# Patient Record
Sex: Female | Born: 1935 | Race: White | Hispanic: No | Smoking: Never smoker
Health system: Southern US, Community
[De-identification: ages and names within clinical notes are randomized; demographics above are authoritative.]

## PROBLEM LIST (undated history)

## (undated) DIAGNOSIS — G629 Polyneuropathy, unspecified: Secondary | ICD-10-CM

## (undated) DIAGNOSIS — I340 Nonrheumatic mitral (valve) insufficiency: Secondary | ICD-10-CM

## (undated) DIAGNOSIS — K219 Gastro-esophageal reflux disease without esophagitis: Secondary | ICD-10-CM

## (undated) DIAGNOSIS — K222 Esophageal obstruction: Secondary | ICD-10-CM

## (undated) DIAGNOSIS — S72001A Fracture of unspecified part of neck of right femur, initial encounter for closed fracture: Secondary | ICD-10-CM

## (undated) DIAGNOSIS — R2681 Unsteadiness on feet: Secondary | ICD-10-CM

## (undated) DIAGNOSIS — M810 Age-related osteoporosis without current pathological fracture: Secondary | ICD-10-CM

## (undated) DIAGNOSIS — R32 Unspecified urinary incontinence: Secondary | ICD-10-CM

## (undated) DIAGNOSIS — G4730 Sleep apnea, unspecified: Secondary | ICD-10-CM

## (undated) DIAGNOSIS — M1990 Unspecified osteoarthritis, unspecified site: Secondary | ICD-10-CM

## (undated) DIAGNOSIS — N189 Chronic kidney disease, unspecified: Secondary | ICD-10-CM

## (undated) DIAGNOSIS — D649 Anemia, unspecified: Secondary | ICD-10-CM

## (undated) DIAGNOSIS — I447 Left bundle-branch block, unspecified: Secondary | ICD-10-CM

## (undated) DIAGNOSIS — I10 Essential (primary) hypertension: Secondary | ICD-10-CM

## (undated) DIAGNOSIS — K224 Dyskinesia of esophagus: Secondary | ICD-10-CM

## (undated) DIAGNOSIS — I509 Heart failure, unspecified: Secondary | ICD-10-CM

## (undated) DIAGNOSIS — I4891 Unspecified atrial fibrillation: Secondary | ICD-10-CM

## (undated) DIAGNOSIS — N281 Cyst of kidney, acquired: Secondary | ICD-10-CM

## (undated) DIAGNOSIS — M109 Gout, unspecified: Secondary | ICD-10-CM

## (undated) DIAGNOSIS — C189 Malignant neoplasm of colon, unspecified: Secondary | ICD-10-CM

## (undated) DIAGNOSIS — E785 Hyperlipidemia, unspecified: Secondary | ICD-10-CM

## (undated) DIAGNOSIS — I351 Nonrheumatic aortic (valve) insufficiency: Secondary | ICD-10-CM

## (undated) HISTORY — PX: HIP FRACTURE SURGERY: SHX118

## (undated) HISTORY — PX: OTHER SURGICAL HISTORY: SHX169

## (undated) HISTORY — PX: ESOPHAGOGASTRODUODENOSCOPY: SHX1529

## (undated) SURGERY — CARDIOVERSION
Anesthesia: Monitor Anesthesia Care

---

## 1997-10-22 ENCOUNTER — Other Ambulatory Visit: Admission: RE | Admit: 1997-10-22 | Discharge: 1997-10-22 | Payer: Self-pay | Admitting: *Deleted

## 1998-11-04 ENCOUNTER — Other Ambulatory Visit: Admission: RE | Admit: 1998-11-04 | Discharge: 1998-11-04 | Payer: Self-pay | Admitting: *Deleted

## 1998-11-04 ENCOUNTER — Encounter (INDEPENDENT_AMBULATORY_CARE_PROVIDER_SITE_OTHER): Payer: Self-pay | Admitting: *Deleted

## 2000-01-13 ENCOUNTER — Ambulatory Visit (HOSPITAL_COMMUNITY): Admission: RE | Admit: 2000-01-13 | Discharge: 2000-01-13 | Payer: Self-pay | Admitting: Gastroenterology

## 2000-07-23 ENCOUNTER — Ambulatory Visit (HOSPITAL_COMMUNITY): Admission: RE | Admit: 2000-07-23 | Discharge: 2000-07-23 | Payer: Self-pay | Admitting: Surgery

## 2000-07-23 ENCOUNTER — Encounter: Payer: Self-pay | Admitting: Surgery

## 2000-07-27 ENCOUNTER — Ambulatory Visit (HOSPITAL_COMMUNITY): Admission: RE | Admit: 2000-07-27 | Discharge: 2000-07-27 | Payer: Self-pay | Admitting: Surgery

## 2000-07-27 ENCOUNTER — Encounter: Payer: Self-pay | Admitting: Surgery

## 2000-08-04 ENCOUNTER — Encounter: Payer: Self-pay | Admitting: Urology

## 2000-08-04 ENCOUNTER — Encounter (INDEPENDENT_AMBULATORY_CARE_PROVIDER_SITE_OTHER): Payer: Self-pay | Admitting: *Deleted

## 2000-08-04 ENCOUNTER — Ambulatory Visit (HOSPITAL_COMMUNITY): Admission: RE | Admit: 2000-08-04 | Discharge: 2000-08-04 | Payer: Self-pay | Admitting: Urology

## 2003-03-23 ENCOUNTER — Ambulatory Visit (HOSPITAL_COMMUNITY): Admission: RE | Admit: 2003-03-23 | Discharge: 2003-03-23 | Payer: Self-pay | Admitting: Internal Medicine

## 2003-04-25 ENCOUNTER — Emergency Department (HOSPITAL_COMMUNITY): Admission: EM | Admit: 2003-04-25 | Discharge: 2003-04-25 | Payer: Self-pay | Admitting: Family Medicine

## 2004-01-30 ENCOUNTER — Encounter: Admission: RE | Admit: 2004-01-30 | Discharge: 2004-01-30 | Payer: Self-pay | Admitting: Internal Medicine

## 2004-10-29 ENCOUNTER — Ambulatory Visit: Payer: Self-pay | Admitting: Internal Medicine

## 2005-01-20 ENCOUNTER — Ambulatory Visit: Payer: Self-pay | Admitting: Internal Medicine

## 2005-01-27 ENCOUNTER — Ambulatory Visit: Payer: Self-pay | Admitting: Internal Medicine

## 2006-02-26 ENCOUNTER — Ambulatory Visit: Payer: Self-pay | Admitting: Internal Medicine

## 2006-03-18 ENCOUNTER — Ambulatory Visit: Payer: Self-pay | Admitting: Internal Medicine

## 2006-03-18 ENCOUNTER — Encounter (INDEPENDENT_AMBULATORY_CARE_PROVIDER_SITE_OTHER): Payer: Self-pay | Admitting: *Deleted

## 2006-04-13 ENCOUNTER — Ambulatory Visit: Payer: Self-pay | Admitting: Internal Medicine

## 2006-04-22 ENCOUNTER — Ambulatory Visit (HOSPITAL_COMMUNITY): Admission: RE | Admit: 2006-04-22 | Discharge: 2006-04-22 | Payer: Self-pay | Admitting: Internal Medicine

## 2006-06-02 ENCOUNTER — Inpatient Hospital Stay (HOSPITAL_COMMUNITY): Admission: RE | Admit: 2006-06-02 | Discharge: 2006-06-15 | Payer: Self-pay | Admitting: General Surgery

## 2006-06-02 ENCOUNTER — Encounter (INDEPENDENT_AMBULATORY_CARE_PROVIDER_SITE_OTHER): Payer: Self-pay | Admitting: Specialist

## 2006-06-08 ENCOUNTER — Encounter: Payer: Self-pay | Admitting: Cardiology

## 2006-06-11 ENCOUNTER — Ambulatory Visit: Payer: Self-pay | Admitting: Physical Medicine & Rehabilitation

## 2006-08-25 ENCOUNTER — Ambulatory Visit: Payer: Self-pay | Admitting: Hematology and Oncology

## 2006-09-03 LAB — COMPREHENSIVE METABOLIC PANEL
Albumin: 4 g/dL (ref 3.5–5.2)
Alkaline Phosphatase: 66 U/L (ref 39–117)
BUN: 29 mg/dL — ABNORMAL HIGH (ref 6–23)
Calcium: 9.1 mg/dL (ref 8.4–10.5)
Creatinine, Ser: 1.64 mg/dL — ABNORMAL HIGH (ref 0.40–1.20)
Glucose, Bld: 105 mg/dL — ABNORMAL HIGH (ref 70–99)
Potassium: 4 mEq/L (ref 3.5–5.3)

## 2006-09-03 LAB — CBC WITH DIFFERENTIAL/PLATELET
Basophils Absolute: 0 10*3/uL (ref 0.0–0.1)
EOS%: 3.6 % (ref 0.0–7.0)
Eosinophils Absolute: 0.2 10*3/uL (ref 0.0–0.5)
HCT: 28.2 % — ABNORMAL LOW (ref 34.8–46.6)
HGB: 9.8 g/dL — ABNORMAL LOW (ref 11.6–15.9)
MCH: 31.5 pg (ref 26.0–34.0)
MCV: 91.1 fL (ref 81.0–101.0)
MONO%: 8.2 % (ref 0.0–13.0)
NEUT#: 3.3 10*3/uL (ref 1.5–6.5)
NEUT%: 61.8 % (ref 39.6–76.8)
Platelets: 210 10*3/uL (ref 145–400)
RDW: 14.3 % (ref 11.3–14.5)

## 2006-09-09 ENCOUNTER — Ambulatory Visit (HOSPITAL_COMMUNITY): Admission: RE | Admit: 2006-09-09 | Discharge: 2006-09-09 | Payer: Self-pay | Admitting: Hematology and Oncology

## 2006-12-28 ENCOUNTER — Ambulatory Visit: Payer: Self-pay | Admitting: Hematology and Oncology

## 2006-12-30 LAB — CBC WITH DIFFERENTIAL/PLATELET
BASO%: 0.5 % (ref 0.0–2.0)
HCT: 30.4 % — ABNORMAL LOW (ref 34.8–46.6)
HGB: 10.6 g/dL — ABNORMAL LOW (ref 11.6–15.9)
MCHC: 34.7 g/dL (ref 32.0–36.0)
MONO#: 0.5 10*3/uL (ref 0.1–0.9)
NEUT%: 51.3 % (ref 39.6–76.8)
WBC: 5.4 10*3/uL (ref 3.9–10.0)
lymph#: 2 10*3/uL (ref 0.9–3.3)

## 2006-12-30 LAB — COMPREHENSIVE METABOLIC PANEL
ALT: 12 U/L (ref 0–35)
Albumin: 4.4 g/dL (ref 3.5–5.2)
CO2: 23 mEq/L (ref 19–32)
Calcium: 9.5 mg/dL (ref 8.4–10.5)
Chloride: 105 mEq/L (ref 96–112)
Creatinine, Ser: 1.66 mg/dL — ABNORMAL HIGH (ref 0.40–1.20)
Total Protein: 7.1 g/dL (ref 6.0–8.3)

## 2006-12-30 LAB — CEA: CEA: 2.3 ng/mL (ref 0.0–5.0)

## 2007-04-12 ENCOUNTER — Ambulatory Visit: Payer: Self-pay | Admitting: Hematology and Oncology

## 2007-04-14 ENCOUNTER — Ambulatory Visit (HOSPITAL_COMMUNITY): Admission: RE | Admit: 2007-04-14 | Discharge: 2007-04-14 | Payer: Self-pay | Admitting: Hematology and Oncology

## 2007-06-09 ENCOUNTER — Encounter: Admission: RE | Admit: 2007-06-09 | Discharge: 2007-09-07 | Payer: Self-pay | Admitting: Internal Medicine

## 2007-07-12 ENCOUNTER — Ambulatory Visit: Payer: Self-pay | Admitting: Hematology and Oncology

## 2007-07-14 LAB — CBC WITH DIFFERENTIAL/PLATELET
BASO%: 0.4 % (ref 0.0–2.0)
HCT: 32.7 % — ABNORMAL LOW (ref 34.8–46.6)
MCHC: 34 g/dL (ref 32.0–36.0)
MONO#: 0.5 10*3/uL (ref 0.1–0.9)
NEUT%: 62 % (ref 39.6–76.8)
RDW: 12.6 % (ref 11.3–14.5)
WBC: 5.9 10*3/uL (ref 3.9–10.0)
lymph#: 1.6 10*3/uL (ref 0.9–3.3)

## 2007-07-15 LAB — COMPREHENSIVE METABOLIC PANEL
ALT: 10 U/L (ref 0–35)
AST: 20 U/L (ref 0–37)
Alkaline Phosphatase: 50 U/L (ref 39–117)
BUN: 35 mg/dL — ABNORMAL HIGH (ref 6–23)
Calcium: 9.9 mg/dL (ref 8.4–10.5)
Chloride: 107 mEq/L (ref 96–112)
Creatinine, Ser: 1.77 mg/dL — ABNORMAL HIGH (ref 0.40–1.20)
Total Bilirubin: 0.3 mg/dL (ref 0.3–1.2)

## 2007-07-21 ENCOUNTER — Encounter: Payer: Self-pay | Admitting: Internal Medicine

## 2007-08-19 ENCOUNTER — Encounter: Payer: Self-pay | Admitting: Internal Medicine

## 2007-08-19 DIAGNOSIS — K649 Unspecified hemorrhoids: Secondary | ICD-10-CM | POA: Insufficient documentation

## 2007-08-19 DIAGNOSIS — Z87442 Personal history of urinary calculi: Secondary | ICD-10-CM | POA: Insufficient documentation

## 2007-08-19 DIAGNOSIS — G4730 Sleep apnea, unspecified: Secondary | ICD-10-CM | POA: Insufficient documentation

## 2007-08-19 DIAGNOSIS — D126 Benign neoplasm of colon, unspecified: Secondary | ICD-10-CM

## 2007-08-19 DIAGNOSIS — E785 Hyperlipidemia, unspecified: Secondary | ICD-10-CM | POA: Insufficient documentation

## 2007-08-19 DIAGNOSIS — K219 Gastro-esophageal reflux disease without esophagitis: Secondary | ICD-10-CM | POA: Insufficient documentation

## 2007-08-19 DIAGNOSIS — I10 Essential (primary) hypertension: Secondary | ICD-10-CM | POA: Insufficient documentation

## 2007-08-19 DIAGNOSIS — D509 Iron deficiency anemia, unspecified: Secondary | ICD-10-CM

## 2007-08-19 DIAGNOSIS — K319 Disease of stomach and duodenum, unspecified: Secondary | ICD-10-CM | POA: Insufficient documentation

## 2007-08-23 ENCOUNTER — Ambulatory Visit: Payer: Self-pay | Admitting: Internal Medicine

## 2007-08-23 DIAGNOSIS — Z85038 Personal history of other malignant neoplasm of large intestine: Secondary | ICD-10-CM | POA: Insufficient documentation

## 2007-09-21 ENCOUNTER — Ambulatory Visit: Payer: Self-pay | Admitting: Internal Medicine

## 2007-09-21 ENCOUNTER — Encounter: Payer: Self-pay | Admitting: Internal Medicine

## 2007-10-28 ENCOUNTER — Ambulatory Visit: Payer: Self-pay | Admitting: Hematology and Oncology

## 2007-11-01 LAB — CEA: CEA: 2.4 ng/mL (ref 0.0–5.0)

## 2007-11-01 LAB — COMPREHENSIVE METABOLIC PANEL
ALT: 12 U/L (ref 0–35)
BUN: 26 mg/dL — ABNORMAL HIGH (ref 6–23)
CO2: 24 mEq/L (ref 19–32)
Calcium: 10.2 mg/dL (ref 8.4–10.5)
Chloride: 104 mEq/L (ref 96–112)
Creatinine, Ser: 1.65 mg/dL — ABNORMAL HIGH (ref 0.40–1.20)

## 2007-11-01 LAB — CBC WITH DIFFERENTIAL/PLATELET
Eosinophils Absolute: 0.1 10*3/uL (ref 0.0–0.5)
HCT: 34.6 % — ABNORMAL LOW (ref 34.8–46.6)
HGB: 11.9 g/dL (ref 11.6–15.9)
LYMPH%: 37.6 % (ref 14.0–48.0)
MONO#: 0.4 10*3/uL (ref 0.1–0.9)
NEUT#: 2.6 10*3/uL (ref 1.5–6.5)
NEUT%: 50.6 % (ref 39.6–76.8)
Platelets: 164 10*3/uL (ref 145–400)
WBC: 5.2 10*3/uL (ref 3.9–10.0)

## 2007-11-03 ENCOUNTER — Ambulatory Visit (HOSPITAL_COMMUNITY): Admission: RE | Admit: 2007-11-03 | Discharge: 2007-11-03 | Payer: Self-pay | Admitting: Hematology and Oncology

## 2007-11-08 ENCOUNTER — Encounter: Payer: Self-pay | Admitting: Internal Medicine

## 2008-03-06 ENCOUNTER — Ambulatory Visit: Payer: Self-pay | Admitting: Hematology and Oncology

## 2008-03-15 ENCOUNTER — Encounter: Payer: Self-pay | Admitting: Internal Medicine

## 2008-07-03 ENCOUNTER — Ambulatory Visit: Payer: Self-pay | Admitting: Hematology and Oncology

## 2008-07-05 LAB — CBC WITH DIFFERENTIAL/PLATELET
BASO%: 0.7 % (ref 0.0–2.0)
HCT: 34.7 % — ABNORMAL LOW (ref 34.8–46.6)
LYMPH%: 36.1 % (ref 14.0–49.7)
MCHC: 33.1 g/dL (ref 31.5–36.0)
MCV: 91.3 fL (ref 79.5–101.0)
MONO%: 7.3 % (ref 0.0–14.0)
NEUT%: 53.7 % (ref 38.4–76.8)
Platelets: 160 10*3/uL (ref 145–400)
RBC: 3.8 10*6/uL (ref 3.70–5.45)

## 2008-07-05 LAB — COMPREHENSIVE METABOLIC PANEL
Alkaline Phosphatase: 46 U/L (ref 39–117)
BUN: 31 mg/dL — ABNORMAL HIGH (ref 6–23)
Glucose, Bld: 109 mg/dL — ABNORMAL HIGH (ref 70–99)
Sodium: 143 mEq/L (ref 135–145)
Total Bilirubin: 0.4 mg/dL (ref 0.3–1.2)

## 2008-07-12 ENCOUNTER — Encounter: Payer: Self-pay | Admitting: Internal Medicine

## 2008-12-19 ENCOUNTER — Ambulatory Visit (HOSPITAL_COMMUNITY): Admission: RE | Admit: 2008-12-19 | Discharge: 2008-12-19 | Payer: Self-pay | Admitting: Interventional Radiology

## 2009-01-02 ENCOUNTER — Encounter: Payer: Self-pay | Admitting: Interventional Radiology

## 2009-07-31 ENCOUNTER — Ambulatory Visit (HOSPITAL_COMMUNITY): Admission: RE | Admit: 2009-07-31 | Discharge: 2009-07-31 | Payer: Self-pay | Admitting: Internal Medicine

## 2010-01-28 ENCOUNTER — Inpatient Hospital Stay (HOSPITAL_COMMUNITY)
Admission: EM | Admit: 2010-01-28 | Discharge: 2010-01-31 | Payer: Self-pay | Source: Home / Self Care | Attending: Orthopedic Surgery | Admitting: Orthopedic Surgery

## 2010-01-29 LAB — CBC
HCT: 24.4 % — ABNORMAL LOW (ref 36.0–46.0)
HCT: 25.3 % — ABNORMAL LOW (ref 36.0–46.0)
Hemoglobin: 7.9 g/dL — ABNORMAL LOW (ref 12.0–15.0)
Hemoglobin: 8.2 g/dL — ABNORMAL LOW (ref 12.0–15.0)
MCH: 30.9 pg (ref 26.0–34.0)
MCH: 30.8 pg (ref 26.0–34.0)
MCHC: 32.4 g/dL (ref 30.0–36.0)
MCHC: 32.4 g/dL (ref 30.0–36.0)
MCV: 95.3 fL (ref 78.0–100.0)
MCV: 95.1 fL (ref 78.0–100.0)
Platelets: 100 10*3/uL — ABNORMAL LOW (ref 150–400)
Platelets: 107 10*3/uL — ABNORMAL LOW (ref 150–400)
RBC: 2.56 MIL/uL — ABNORMAL LOW (ref 3.87–5.11)
RBC: 2.66 MIL/uL — ABNORMAL LOW (ref 3.87–5.11)
RDW: 12.4 % (ref 11.5–15.5)
RDW: 12.4 % (ref 11.5–15.5)
WBC: 6.5 10*3/uL (ref 4.0–10.5)
WBC: 7 10*3/uL (ref 4.0–10.5)

## 2010-01-29 LAB — BASIC METABOLIC PANEL
BUN: 20 mg/dL (ref 6–23)
CO2: 28 mEq/L (ref 19–32)
Calcium: 8.7 mg/dL (ref 8.4–10.5)
Chloride: 107 mEq/L (ref 96–112)
Creatinine, Ser: 1.68 mg/dL — ABNORMAL HIGH (ref 0.4–1.2)
GFR calc Af Amer: 35 mL/min — ABNORMAL LOW (ref >60)
GFR calc non Af Amer: 29 mL/min — ABNORMAL LOW (ref >60)
Glucose, Bld: 106 mg/dL — ABNORMAL HIGH (ref 70–99)
Potassium: 4.5 mEq/L (ref 3.5–5.1)
Sodium: 141 mEq/L (ref 135–145)

## 2010-01-29 LAB — FERRITIN: Ferritin: 153 ng/mL (ref 10–291)

## 2010-01-29 LAB — IRON AND TIBC
Iron: 50 ug/dL (ref 42–135)
Saturation Ratios: 23 % (ref 20–55)
TIBC: 217 ug/dL — ABNORMAL LOW (ref 250–470)
UIBC: 167 ug/dL

## 2010-01-29 LAB — FOLATE: Folate: >20 ng/mL

## 2010-01-30 LAB — BASIC METABOLIC PANEL
BUN: 17 mg/dL (ref 6–23)
CO2: 27 mEq/L (ref 19–32)
Calcium: 8.7 mg/dL (ref 8.4–10.5)
Chloride: 109 mEq/L (ref 96–112)
Creatinine, Ser: 1.73 mg/dL — ABNORMAL HIGH (ref 0.4–1.2)
GFR calc Af Amer: 33 mL/min — ABNORMAL LOW (ref >60)
GFR calc non Af Amer: 28 mL/min — ABNORMAL LOW (ref >60)
Glucose, Bld: 126 mg/dL — ABNORMAL HIGH (ref 70–99)
Potassium: 4.5 mEq/L (ref 3.5–5.1)
Sodium: 141 mEq/L (ref 135–145)

## 2010-01-30 LAB — CBC
HCT: 22.7 % — ABNORMAL LOW (ref 36.0–46.0)
HCT: 32.5 % — ABNORMAL LOW (ref 36.0–46.0)
Hemoglobin: 7.2 g/dL — ABNORMAL LOW (ref 12.0–15.0)
Hemoglobin: 10.9 g/dL — ABNORMAL LOW (ref 12.0–15.0)
MCH: 30.3 pg (ref 26.0–34.0)
MCH: 30.8 pg (ref 26.0–34.0)
MCHC: 31.7 g/dL (ref 30.0–36.0)
MCHC: 33.5 g/dL (ref 30.0–36.0)
MCV: 95.4 fL (ref 78.0–100.0)
MCV: 91.8 fL (ref 78.0–100.0)
Platelets: 88 10*3/uL — ABNORMAL LOW (ref 150–400)
Platelets: 90 10*3/uL — ABNORMAL LOW (ref 150–400)
RBC: 2.38 MIL/uL — ABNORMAL LOW (ref 3.87–5.11)
RBC: 3.54 MIL/uL — ABNORMAL LOW (ref 3.87–5.11)
RDW: 12.5 % (ref 11.5–15.5)
RDW: 13.9 % (ref 11.5–15.5)
WBC: 6.7 10*3/uL (ref 4.0–10.5)
WBC: 7 10*3/uL (ref 4.0–10.5)

## 2010-01-30 LAB — PREPARE RBC (CROSSMATCH)

## 2010-01-30 LAB — VITAMIN B12: Vitamin B-12: 465 pg/mL (ref 211–911)

## 2010-01-31 LAB — CBC
HCT: 32.6 % — ABNORMAL LOW (ref 36.0–46.0)
Hemoglobin: 10.8 g/dL — ABNORMAL LOW (ref 12.0–15.0)
MCH: 30.3 pg (ref 26.0–34.0)
MCHC: 33.1 g/dL (ref 30.0–36.0)
MCV: 91.3 fL (ref 78.0–100.0)
Platelets: 98 10*3/uL — ABNORMAL LOW (ref 150–400)
RBC: 3.57 MIL/uL — ABNORMAL LOW (ref 3.87–5.11)
RDW: 14 % (ref 11.5–15.5)
WBC: 8.9 10*3/uL (ref 4.0–10.5)

## 2010-01-31 LAB — BASIC METABOLIC PANEL
BUN: 16 mg/dL (ref 6–23)
CO2: 26 mEq/L (ref 19–32)
Calcium: 9.1 mg/dL (ref 8.4–10.5)
Chloride: 107 mEq/L (ref 96–112)
Creatinine, Ser: 1.51 mg/dL — ABNORMAL HIGH (ref 0.4–1.2)
GFR calc Af Amer: 39 mL/min — ABNORMAL LOW (ref >60)
GFR calc non Af Amer: 32 mL/min — ABNORMAL LOW (ref >60)
Glucose, Bld: 113 mg/dL — ABNORMAL HIGH (ref 70–99)
Potassium: 4.5 mEq/L (ref 3.5–5.1)
Sodium: 142 mEq/L (ref 135–145)

## 2010-02-16 ENCOUNTER — Encounter: Payer: Self-pay | Admitting: Hematology and Oncology

## 2010-02-16 ENCOUNTER — Encounter: Payer: Self-pay | Admitting: Internal Medicine

## 2010-03-05 NOTE — Consult Note (Signed)
Melissa Robinson, Melissa Robinson                 ACCOUNT NO.:  [id number]  MEDICAL RECORD NO.:  [id number]          PATIENT TYPE:  INP  LOCATION:  1619                         FACILITY:  [facility]  PHYSICIAN:  Melissa Robinson, M.D.  DATE OF BIRTH:  02/15/1916  DATE OF CONSULTATION:  01/29/2010 DATE OF DISCHARGE:                                CONSULTATION   REQUESTING PHYSICIAN:  Melissa Robinson. Melissa Robinson, M.D.  REASON FOR CONSULTATION:  Medical management following open reduction and internal fixation of right hip fracture.  HISTORY OF PRESENT ILLNESS:  Ms. Melissa Robinson is a remarkable 77 year old white female with osteoporosis with prior thoracic compression fracture status post kyphoplasty (December 2010), chronic renal insufficiency, and anemia of chronic disease who presented to the emergency department with right hip pain following a fall.  The patient is a remarkably independent lady who lives alone.  She has a very supportive family and maintains most of her own affairs.  She does have some slight instability of gait at home but has been very resistant to using walking aids.  On the morning of admission, she slipped when standing from a chair and landed on her right hip resulting in a right hip fracture. She presented to the emergency department and was taken to the operating room last evening by Dr. Charlann Robinson.  We have been requested to manage her medical issues as Dr. Charlann Robinson has graciously accepted the patient on his service.  She tolerated the procedure very well last evening.  This morning, she is without complaint.  She states that the pain is tolerable.  She denies any chest pain or shortness of breath.  REVIEW OF SYMPTOMS:  Chronic back and hip pain prior to surgery, unsteady gait.  No other complaints.  PAST MEDICAL HISTORY: 1. Hypertension. 2. Osteoporosis with prior T11-T12 and T10 compression fractures with     kyphoplasty of T10 (December 2010). 3. Chronic renal insufficiency. 4. Congestive  heart failure (ejection fraction in May 2008 of 30% to     40% in the setting of acute illness).  No recurrent heart failure     symptoms since that hospitalization 5. Colon cancer stage 3, status post hemicolectomy (May 2008). 6. Hypertriglyceridemia. 7. Obstructive sleep apnea. 8. Gout. 9. Dysphagia status post EGD with dilatation. 10.Anemia of chronic disease. 11.Left renal cyst aspiration. 12.Status post rotator cuff surgery.  PREMEDICATIONS: 1. Ferrex 150 mg daily. 2. Lasix 20 mg daily. 3. Miacalcin nasal spray altering nostrils daily. 4. Norvasc 10 mg daily. 5. Prilosec 20 mg daily. 6. Simvastatin 20 mg daily. 7. Tramadol 50 mg q.8 h p.r.n. pain. 8. Evista 60 mg daily. 9. Calcium/vitamin D b.i.d. 10.Aspirin 81 mg daily. 11.Multivitamins daily. 12.Oxybutynin 5 mg b.i.d. 13.Losartan 50 mg daily. 14.Trazodone 150 mg q.h.s. 15.Vitamin D daily.  ALLERGIES:  No known drug allergies.  SOCIAL HISTORY:  She is a widow.  Her husband was a Development worker, community at [facility].  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  She has 10 children and has a a very supportive family, many of whom are in [place].  No tobacco, alcohol, or drug use.  FAMILY HISTORY:  Father died of depression/suicide.  Mother died at 40 from heart disease.  Brother had throat cancer and sister with breast cancer.  PHYSICAL EXAMINATION:  VITAL SIGNS:  Temperature 98.5, pulse 53 to 58, blood pressure 117 to 145 over 51 to 69, oxygen saturation 97% on 2 L. GENERAL:  In general, very energetic jovial female, appears younger than her stated age. HEENT:  Oropharynx is moist.  No scleral icterus. NECK:  Supple without lymphadenopathy, JVD or carotid bruits. HEART:  Regular rate and rhythm without murmurs, rubs or gallops. LUNGS:  Clear to auscultation bilaterally. ABDOMEN:  Soft, nondistended, nontender with normoactive bowel sounds. EXTREMITIES:  Her right hip as a dressing intact without any blood or drainage.  No  significant edema.  Mildly tender.  NEUROLOGIC:  Alert and oriented x4.  No focal deficits.  LABORATORY DATA:  CBC shows a white count of 6.5, hemoglobin 7.9 (9.7 preoperatively), platelets 100.  BMET significant for sodium 141, potassium 4.5, chloride 107, bicarb 28, BUN 20, creatinine 1.7, glucose 106.  ASSESSMENT/PLAN: 1. Right hip fracture status post open reduction and internal fixation     (postop day #1) - we will continue postoperative care per     Orthopedics.  She is doing remarkably well at this point.  She is     on Lovenox and SCDs for DVT prophylaxis. 2. Hypertension - blood pressure reasonably controlled.  We will hold     her Cozaar for now and monitor her renal function.  When stable, we     will resume her Cozaar.  We will restart her Norvasc at this point     at a decrease dose 5 mg daily and titrate back to 10 mg as needed. 3. Anemia - she does have anemia of chronic disease related to her     renal insufficiency superimposed on acute blood loss anemia related     to her fracture.  We will recheck her CBC and consider transfusion     if her hemoglobin drops any further.  She is currently asymptomatic     with stable blood pressure.  We will obtain an anemia profile to     evaluate her iron stores.  We will continue iron and add Aranesp     for anemia related to chronic renal insufficiency. 4. Chronic renal insufficiency.  She is stable.  We will hold her ARB     and diuretics for short period of time and then resume as long as     she remains stable. 5. Osteoporosis - she has been on Evista.  Other options including     bisphosphonates have been limited by her chronic renal     insufficiency.  We will need to consider Forteo when she is stable     and discharge from rehab. 6. Disposition - anticipate she will need skilled nursing facility     rehab after discharge for short period of time.  Hopefully, she     will be able to return home to her independent living  setting.  I greatly appreciate the prompt orthopedics management of this special lady who is my first patient in [place].  I will continue to follow her course with you as an inpatient and help with transition home after her rehab stay.     Melissa Robinson, M.D.     WS/MEDQ  D:  01/30/2010  T:  01/30/2010  Job:  [id number]  cc:   Melissa Robinson Melissa Robinson, M.D. Fax: [phone]  Electronically  Signed by Lacretia Nicks. Buren Kos M.D. on 03/05/2010 12:20:08 PM

## 2010-04-07 LAB — PROTIME-INR
INR: 1.11 (ref 0.00–1.49)
Prothrombin Time: 14.5 seconds (ref 11.6–15.2)

## 2010-04-07 LAB — DIFFERENTIAL
Basophils Absolute: 0 10*3/uL (ref 0.0–0.1)
Basophils Relative: 1 % (ref 0–1)
Eosinophils Absolute: 0.1 10*3/uL (ref 0.0–0.7)
Eosinophils Relative: 1 % (ref 0–5)
Lymphocytes Relative: 14 % (ref 12–46)
Lymphs Abs: 0.9 10*3/uL (ref 0.7–4.0)
Monocytes Absolute: 0.6 10*3/uL (ref 0.1–1.0)
Monocytes Relative: 9 % (ref 3–12)
Neutro Abs: 5.1 10*3/uL (ref 1.7–7.7)
Neutrophils Relative %: 76 % (ref 43–77)

## 2010-04-07 LAB — TYPE AND SCREEN
ABO/RH(D): O NEG
Antibody Screen: NEGATIVE
Unit division: 0
Unit division: 0

## 2010-04-07 LAB — CBC
HCT: 29.5 % — ABNORMAL LOW (ref 36.0–46.0)
Hemoglobin: 9.7 g/dL — ABNORMAL LOW (ref 12.0–15.0)
MCH: 31.3 pg (ref 26.0–34.0)
MCHC: 32.9 g/dL (ref 30.0–36.0)
MCV: 95.2 fL (ref 78.0–100.0)
Platelets: 129 10*3/uL — ABNORMAL LOW (ref 150–400)
RBC: 3.1 MIL/uL — ABNORMAL LOW (ref 3.87–5.11)
RDW: 12.3 % (ref 11.5–15.5)
WBC: 6.8 10*3/uL (ref 4.0–10.5)

## 2010-04-07 LAB — BASIC METABOLIC PANEL
BUN: 22 mg/dL (ref 6–23)
CO2: 27 mEq/L (ref 19–32)
Calcium: 9.2 mg/dL (ref 8.4–10.5)
Chloride: 105 mEq/L (ref 96–112)
Creatinine, Ser: 1.79 mg/dL — ABNORMAL HIGH (ref 0.4–1.2)
GFR calc Af Amer: 32 mL/min — ABNORMAL LOW (ref >60)
GFR calc non Af Amer: 27 mL/min — ABNORMAL LOW (ref >60)
Glucose, Bld: 108 mg/dL — ABNORMAL HIGH (ref 70–99)
Potassium: 4.3 mEq/L (ref 3.5–5.1)
Sodium: 141 mEq/L (ref 135–145)

## 2010-04-07 LAB — URINE CULTURE
Colony Count: NO GROWTH
Culture  Setup Time: 201201040125
Culture: NO GROWTH

## 2010-04-07 LAB — URINALYSIS, ROUTINE W REFLEX MICROSCOPIC
Bilirubin Urine: NEGATIVE
Glucose, UA: NEGATIVE mg/dL
Hgb urine dipstick: NEGATIVE
Ketones, ur: NEGATIVE mg/dL
Nitrite: NEGATIVE
Protein, ur: NEGATIVE mg/dL
Specific Gravity, Urine: 1.013 (ref 1.005–1.030)
Urobilinogen, UA: 0.2 mg/dL (ref 0.0–1.0)
pH: 6.5 (ref 5.0–8.0)

## 2010-04-07 LAB — APTT: aPTT: 31 seconds (ref 24–37)

## 2010-04-30 LAB — CBC
HCT: 32.5 % — ABNORMAL LOW (ref 36.0–46.0)
Hemoglobin: 11.2 g/dL — ABNORMAL LOW (ref 12.0–15.0)
RBC: 3.41 MIL/uL — ABNORMAL LOW (ref 3.87–5.11)
RDW: 12.7 % (ref 11.5–15.5)
WBC: 5.9 10*3/uL (ref 4.0–10.5)

## 2010-04-30 LAB — APTT: aPTT: 33 seconds (ref 24–37)

## 2010-04-30 LAB — BASIC METABOLIC PANEL
GFR calc non Af Amer: 28 mL/min — ABNORMAL LOW (ref >60)
Glucose, Bld: 116 mg/dL — ABNORMAL HIGH (ref 70–99)
Potassium: 4.2 mEq/L (ref 3.5–5.1)
Sodium: 140 mEq/L (ref 135–145)

## 2010-06-10 NOTE — Discharge Summary (Signed)
NAMEDANNIELL, ROTUNDO                 ACCOUNT NO.:  [id number]   MEDICAL RECORD NO.:  [id number]          PATIENT TYPE:  INP   LOCATION:  1416                         FACILITY:  [facility]   PHYSICIAN:  Adolph Pollack, M.D.DATE OF BIRTH:  06-05-1916   DATE OF ADMISSION:  06/02/2006  DATE OF DISCHARGE:  06/15/2006                               DISCHARGE SUMMARY   FINAL DIAGNOSIS:  Stage III right colon cancer.   SECONDARY DIAGNOSES:  1. Postoperative ileus  2. Hypertension.  3. Acute parotiditis.  4. Congestive heart failure.  5. Delirium.  6. Deconditioned state  7. Chronic renal insufficiency with acute exacerbation.  8. Hypokalemia.  9. Anemia.  10.Escherichia coli urinary tract infection.   PROCEDURE:  Laparoscopic-assisted right colectomy.   REASON FOR ADMISSION:  Ms. Russell is an 77 year old female who was  noted to have anemia, weight loss and decreasing appetite.  A  colonoscopy demonstrated a very large villous lesion of the cecum and  ascending colon.  The biopsy was consistent with an adenomatous-type  polyp.  However, it was felt that her blood loss might be secondary to  this and because it could not be removed completely by way of endoscopy,  I was subsequently asked to see her.  We discussed the right colectomy  and she was admitted for that.  Please that she was secondary diagnosis  year E-coli urinary tract infection.   HOSPITAL COURSE:  She underwent a laparoscopic-assisted right colectomy.  Postoperatively, she was relatively stable overnight.  Her pathology  came back a T3 N1 adenocarcinoma, making it a stage III colon cancer.  She had some mild ileus and diet was then advanced to clear liquids.  She had some mild hypoxemia and we checked a chest x-ray.  She also  complained of a tender left preauricular swelling.  She had bibasilar  rales and chest x-ray consistent with pulmonary edema.  I subsequently  called Dr. Clelia Croft in for a medical consult and he began  treatment of the  CHF with diuresis.  Unasyn was started for her parotiditis.  Her BNP had  been elevated.  Her troponin levels were also elevated, potentially  suggesting strain from her CHF.  Dr. Clelia Croft was able to treat her  medically and stabilize her from her congestive heart failure.  She had  some delirium and Haldol was used p.r.n.; this slowly improved.  Subsequently, her bowel function began to return and she was able to  have her diet advanced.  By her eighth postoperative day her staples  were removed and she was placed on oral Augmentin.  It was felt that she  would benefit from a comprehensive skilled nursing facility care.  Subsequently, arrangements were made for her to go a skilled nursing  facility ([facility]).  By her 13th postoperative day she was  stronger, tolerating a diet.  At that point in time she did not wish to  go to [facility] but wanted to be discharged home.  I spoke with the  family and they said they could provide her with 24-hour care.  It was  also  noted that she still had some chronic renal insufficiency with a  BUN of 30, creatinine about 2.5.  This was improved.   DISPOSITION:  She was discharged to home in the care of her family Jun 15, 2006, which is postoperative day #13.   DISCHARGE MEDICATIONS:  1. Atenolol 100 mg daily.  2. Protonix 40 mg daily.  3. Aspirin 81 mg daily.  4. Cozaar 100 mg daily.  5. Multivitamin.  6. Norvasc 10 mg daily.  7. Darvocet for pain.  8. Evista.  9. Calcium with D.  10.Fortical.   She is to see Dr. Clelia Croft in a couple of days to have blood work checked.  She will come back and see me in 2 weeks.      Adolph Pollack, M.D.  Electronically Signed     TJR/MEDQ  D:  08/11/2006  T:  08/12/2006  Job:  [id number]   cc:   [name], M.D.  Fax: [phone]

## 2010-06-13 NOTE — Procedures (Signed)
[facility]  Patient:    Melissa Robinson, Melissa Robinson                        MRN: [id number] Proc. Date: 01/13/00 Adm. Date:  [id number] Attending:  Orland Mustard CC:         Georg Ruddle. Viviann Spare, M.D.  Erskine Speed, M.D.   Procedure Report  DATE OF BIRTH:  August 22, 1916  PROCEDURE:  Colonoscopy.  MEDICATIONS:  Fentanyl 50 mcg, Versed 6 mg IV.  SCOPE:  Pediatric Olympus video colonoscope.  INDICATIONS:  Colon cancer screening.  DESCRIPTION OF PROCEDURE:  The procedure had been explained to the patient and consent obtained.  With the patient in the left lateral decubitus, the Olympus pediatric video colonoscope was inserted and advanced under direct visualization.  The prep was marginal.  There was sticky, adherent stool, particularly in the right colon.  A small polyp could have been missed.  The ileocecal valve was seen and the appendiceal orifice was clearly identified. The scope was withdrawn.  The cecum, ascending colon, hepatic flexure, transverse colon, splenic flexure were seen well.  A small AVM 2-3 mm was seen in the descending colon and was not actively bleeding.  There was no significant diverticular disease and no polyps or tumors seen throughout the colon.  The rectum was also seen and was normal other than the presence of fairly large internal hemorrhoids. The patient tolerated the procedure well and was maintained on low flow oxygen and pulse oximetry throughout the procedure.  ASSESSMENT: 1. Internal hemorrhoids. 2. 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  PLAN: 1. Will give hemorrhoid instructions. 2. Recommend yearly Hemoccults and sigmoidoscopy in five years. DD:  01/13/00 TD:  01/14/00 Job: [id number]

## 2010-06-13 NOTE — Assessment & Plan Note (Signed)
[facility]                         GASTROENTEROLOGY OFFICE NOTE   NAME:Griffitts, Franshesca R                        MRN:          [id number]  DATE:02/26/2006                            DOB:          09/02/1916    REFERRING PHYSICIAN:  [name], M.D.   REASON FOR CONSULTATION:  Iron deficiency anemia.   HISTORY:  This is a pleasant, 77 year old female with a history of  hypertension, kidney stones, sleep apnea, and gastroesophageal reflux  disease complicated by peptic stricture, for which she has undergone  prior esophageal dilation.  Patient is now referred through the courtesy  of Dr. Clelia Croft regarding iron deficiency anemia.  Patient has noticed in  recent months problems with fatigue.  She has also had decreased  appetite and weight loss.  She also reports recurrent solid food  dysphagia, and required the Heimlich maneuver after getting choked on a  piece of chicken recently.  As part of her workup for fatigue, she  underwent a CBC, January 22, 2006.  She was anemic with a hemoglobin of  10.3.  Additional laboratories confirmed iron deficiency with a ferritin  level of 13.7, and an iron saturation of 18.3%.  B12 and folate were  normal.  Her MCV was normal.  White blood cell count and platelets were  normal.  She was placed on iron.  She denies other GI symptoms such as  abdominal pain, change in bowel habits, melena or hematochezia.  She  apparently submitted hemoccult cards, though the results are unknown.  She did undergo colonoscopy with Dr. Carman Ching in December of 2001  for the purposes of colon cancer screening.  She was noted to have  internal hemorrhoids, as well as small arteriovenous malformations of  the descending colon.  No other abnormalities.  Her last upper endoscopy  was performed in January of 2007 for recurrent dysphagia.  At that time,  the esophagus was dilated to a maximal diameter of 54 [place] via a  Maloney dilator.   It was recommended that she continue on omeprazole.   PAST MEDICAL HISTORY:  As above.   PAST SURGICAL HISTORY:  None.   FAMILY HISTORY:  Brother with throat cancer.   SOCIAL HISTORY:  Patient is widowed with 10 children.  She lives alone.  She is a retired Engineer, civil (consulting).   ALLERGIES:  NO KNOWN DRUG ALLERGIES.   CURRENT MEDICATIONS:  1. Atenolol 100 mg daily.  2. Hydrochlorothiazide 25 mg daily.  3. Aspirin 81 mg daily.  4. Calcium with vitamin D.  5. Multivitamin.  6. Nifedipine 60 mg daily.  7. Raloxifene 60 mg daily.  8. B12.  9. Trazodone 50 mg at night.  10.Fortical nasal spray.   PHYSICAL EXAMINATION:  Pleasant, elderly female in no acute distress.  Blood pressure is 126/70.  Heart rate is 60 and regular.  Her weight is  134.8 pounds.  HEENT:  Sclerae are anicteric, conjunctivae are slightly pale.  The oral  mucosa is intact.  There are no telangiectasias on the buccal mucosa.  There is no adenopathy.  LUNGS:  Clear.  HEART:  Regular.  ABDOMEN:  Soft without tenderness, mass, or hernia.  Good bowel sounds  are heard.   IMPRESSION:  1. Symptomatic iron deficiency anemia.  Rule out chronic      gastrointestinal blood loss from previously noted colonic vascular      malformations, ulcer, or occult neoplasm (particularly given weight      loss).  2. History of gastroesophageal reflux disease with peptic stricture.      Patient is currently experiencing recurrent dysphagia likely due to      recurrent stricture formation.   RECOMMENDATIONS:  1. Daily proton pump inhibitor therapy.  2. Iron replacement therapy.  3. Schedule colonoscopy and upper endoscopy with esophageal dilation.      The nature of both procedures, as well as the risks, benefits, and      alternatives were discussed in detail.  She understood and agreed      to proceed.     [name]. [name], MD  Electronically Signed    JNP/MedQ  DD: 02/28/2006  DT: 02/28/2006  Job #: [id number]   cc:   [name],  M.D.

## 2010-06-13 NOTE — Consult Note (Signed)
[name], Robinson                 ACCOUNT NO.:  [id number]   MEDICAL RECORD NO.:  [id number]          PATIENT TYPE:  INP   LOCATION:  1231                         FACILITY:  [facility]   PHYSICIAN:  [name], M.D.  DATE OF BIRTH:  04-19-1916   DATE OF CONSULTATION:  DATE OF DISCHARGE:                                 CONSULTATION   MEDICINE CONSULTATION:   REQUESTING PHYSICIAN:  Adolph Pollack, M.D.   REASON FOR CONSULTATION:  Shortness of breath, hypoxemia  postoperatively.   HISTORY OF PRESENT ILLNESS:  Melissa Robinson is a very pleasant 77 year old  white female with a history of hypertension, chronic renal  insufficiency, and persistent iron-deficiency anemia, was admitted on  May 7 for laparoscopic-assisted colectomy due to a large right-sided  colon polyp.  The patient has had progressive iron-deficiency anemia and  underwent outpatient colonoscopic evaluation, which revealed a  concerning broad-based polyp.  Despite the negative biopsy results,  concern was present for a possible underlying cancer.  After a very  thoughtful approach and informed decision-making, she underwent her  surgery on May 7, which did reveal stage III colon cancer.  She did very  well initially postoperatively until at about 36-48 hours ago, when she  developed increasing shortness of breath, confusion, and hypoxia.  She  had a chest x-ray performed yesterday that did show increased edema from  her preoperative x-ray.  She has received two doses of Lasix with  stabilization but no definite improvement.  She denies any chest pain,  cough, purulence, or hemoptysis.  In addition, she has developed left-  sided facial pain and swelling.  She was placed on Unasyn for a left  parotitis.   REVIEW OF SYSTEMS:  All systems were reviewed with the patient and are  negative except in the HPI.  She is starting to pass some gas and has  minimal abdominal tenderness.  She has back pain.  All other systems  are  negative.   PAST MEDICAL HISTORY:  1. Stage III colon cancer, status post right laparoscopic-assisted      colectomy on Jun 02, 2006, as above.  2. Hypertension.  3. Chronic renal insufficiency with baseline creatinine of 1.8-1.9.  4. Osteoporosis with a history of vertebral compression fracture.  5. Iron-deficiency anemia.  6. Dysphasia secondary to stricture, status post he the patient      (February 2008).   CURRENT MEDICATIONS:  1. Unasyn 3 g q.6. hours, day #1.  2. Atenolol 100 mg daily.  3. Lovenox 30 mg daily.  4. Protonix 40 mg daily  5. Lasix p.r.n.   ALLERGIES:  No known drug allergies.   SOCIAL HISTORY:  She is a widow.  Her husband was actually an Administrator, arts  in [place].  She has 10 children and multiple grandchildren.  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  No  tobacco, alcohol or drug use.   FAMILY HISTORY:  Mother died of heart disease later in life.  Brother  and sister had throat cancer and breast cancer, respectively.  PHYSICAL EXAMINATION:  VITAL SIGNS:  Temperature 97.8, pulse 79-82,  respirations 24, blood pressure 178-183 over 90-103, with oxygen  saturation 93% on 100% mask.  Decreases to 85% on nasal cannula.  GENERAL:  She is to get neck and dysarthric.  Some facial and arm  swelling.  HEENT:  Oropharynx is moist.  NECK:  Supple without lymphadenopathy, JVD or carotid bruits.  She does  have a left parotid swelling and tenderness with no erythema.  HEART:  Regular rate and rhythm with frequent ectopy.  LUNGS:  Bibasilar crackles about a third of the way up.  No rhonchi.  ABDOMEN:  Her incision is clear, dry and intact with minimal tenderness.  No rebound or guarding.  NEUROLOGIC:  Alert and oriented x4.  Slightly dysarthric.  No focal  abnormalities.   LABORATORY DATA:  CBC shows a white count of 16.2, hemoglobin 12.2,  platelets 235.  BMET significant for sodium 137, potassium 3.5, chloride  105, bicarb  23, BUN 17, creatinine 1.4, glucose 149.  BNP 3070.  Troponin 1.4.  CK normal at 48.   STUDIES:  1. Chest x-ray personally reviewed shows increasing bilateral right      upper lobe and right lower lobe greater than left lower lobe air      space disease, which is likely pulmonary edema.  She does have      cardiomegaly, which was present preoperatively.  2. EKG from May 1 (preoperatively) shows a left bundle branch block      which is old compared to prior EKG.   ASSESSMENT/PLAN:  1. Hypoxia, likely secondary to congestive heart failure in the      setting of hypertension and IV fluid hydration.  This is likely due      to diastolic dysfunction and her slight increase in cardiac enzymes      are likely due to supply/ in the setting of her surgery.  I doubt      that she has had a postoperative myocardial infarction of      significance.  Will obtain an echocardiogram and monitor serial      cardiac enzymes.  Will diurese aggressively with Lasix.  Continue      her atenolol.  Restart her Cozaar with close monitoring of her      renal function.  We will use clonidine as needed for elevations of      blood pressure.  Will monitor her I&O's and daily weights closely.      I doubt that this is a pulmonary embolism or pneumonia given her      chest x-ray findings and markedly increased BNP.  2. Chronic renal insufficiency.  Will monitor her renal function      closely with ARB and diuresis.  Expect increase in creatinine based      on hemoconcentration.  3. Left parotitis.  I agree with Unasyn.  We will try lemon drops as      tolerated.  Consider imaging, although this does appear to be an      acute infection in the setting of her surgery.  4. Delirium.  Likely sundowning due to her acute illness.  Will obtain      a TSH and urinalysis.  Avoid oversedation.  5. Ethics.  I have discussed this with the patient and her daughter.     She is DNR and has a living will.  She does not desire       extraordinary means, including CPR, intubation,  or artificial      nutrition even for a short period of time.  She has been very      consistent in these wishes.   I appreciate the opportunity to participate in Ms. Arizola's care and I  appreciate the expert surgical management.  I will follow her course  closely with you.      [name], M.D.  Electronically Signed     WS/MEDQ  D:  06/07/2006  T:  06/07/2006  Job:  [id number]

## 2010-06-13 NOTE — Assessment & Plan Note (Signed)
[facility]                         GASTROENTEROLOGY OFFICE NOTE   NAME:Racanelli, Dante R                        MRN:          [id number]  DATE:04/13/2006                            DOB:          29-Nov-1916    HISTORY:  Marshell presents today for follow-up.  She complains of problems  with poor appetite, difficulty swallowing, and questions regarding  possible surgery for a large right colon villous adenoma.  The patient  was evaluated in the office February 26, 2006, for iron-deficiency  anemia.  Also weight loss and complaints of dysphagia.  Colonoscopy  revealed a large, sprawling villous lesion involving the cecum and  ascending colon.  This was not amenable to endoscopic removal.  Upper  endoscopy revealed mild stricture of the distal esophagus as well as a  small hiatal hernia.  No other abnormalities.  The esophagus was dilated  with a 50 [place] Maloney dilator.  The patient was referred to Dr.  Abbey Chatters for consideration of laparoscopic-assisted right  hemicolectomy.  That consultation occurred March 30, 2006.  After  complete discussion regarding the procedure, the patient wished to think  about things a bit.  Dr. Clelia Croft has scheduled the patient for a barium  swallow as her swallowing difficulties continue despite dilation as  discussed.  She points to the cervical esophagus.  Question  cricopharyngeal bar.   MEDICATIONS:  As per her last visit.   PHYSICAL EXAMINATION:  GENERAL:  A well-appearing female in no acute  distress.  VITAL SIGNS:  Blood pressure 130/70, heart rate 72, weight is 135.6  pounds (increased 1.2 pounds).  HEENT:  Sclerae are anicteric.  ABDOMEN:  Soft without tenderness, mass or hernia.   IMPRESSION:  1. Iron-deficiency anemia secondary to large villous lesion in the      right colon.  Though biopsy is negative, the lesion may harbor      cancer.  I discussed with her today the pros and cons of proceeding      with surgery.   She is favoring surgery.  2. Nonspecific complaints of decreased appetite.  3. Dysphagia.  Barium swallow pending.  Endoscopy with dilation as      discussed above.  No further GI  therapy planned.   RECOMMENDATIONS:  The patient and her daughter are going to contact Dr.  Abbey Chatters to set up surgery.  As well, we will await results of her  swallowing study.     [name]. [name], MD  Electronically Signed    JNP/MedQ  DD: 04/14/2006  DT: 04/15/2006  Job #: [id number]   cc:   [name], M.D.  Adolph Pollack, M.D.

## 2010-06-13 NOTE — Op Note (Signed)
NAMEVINCIE, Melissa Robinson                 ACCOUNT NO.:  [id number]   MEDICAL RECORD NO.:  [id number]          PATIENT TYPE:  INP   LOCATION:  1529                         FACILITY:  [facility]   PHYSICIAN:  Melissa Robinson, M.D.DATE OF BIRTH:  1917-01-19   DATE OF PROCEDURE:  06/02/2006  DATE OF DISCHARGE:                               OPERATIVE REPORT   PREOPERATIVE DIAGNOSIS:  Large adenomatous mass of the cecum.   POSTOPERATIVE DIAGNOSIS:  Large adenomatous mass of the cecum.   PROCEDURE:  Laparoscopic assisted right colectomy with resection of  terminal ileum.   SURGEON:  Melissa Robinson, M.D.   ASSISTANT:  Melissa Robinson, M.D.   ANESTHESIA:  General.   INDICATIONS:  This 77 year old female was discovered to have anemia and  on a workup, she had a very large polypoid mass of the cecum that was  biopsied and demonstrated adenomatous changes.  Because it could not be  removed completely, she now presents for elective partial colectomy.   TECHNIQUE:  She is seen in the holding area and brought to the operating  room, placed supine on the operating table and general anesthetic was  administered.  A Foley catheter was placed in the bladder.  The  abdominal wall was sterilely prepped and draped.  A small subumbilical  incision was made through the skin, subcutaneous tissue, fascia and  peritoneum, entering the peritoneal cavity under direct vision.  A  pursestring suture of 0 Vicryl was placed around the fascial edges.  A  Hassan trocar was introduced to the peritoneal cavity and  pneumoperitoneum created by insufflation of CO2 gas.   Next, a laparoscope was introduced.  A 5 mm trocar was placed in the  right lower quadrant and a 10 mm trocar placed in the left upper  quadrant.  The right colon and cecum were then retracted medially  exposing the thin peritoneal attachments which were divided with the  harmonic scalpel mobilizing the right colon and allowing me to retract  it  medially.  I then retracted hepatic flexure inferiorly and divided  its thin attachments and mobilized the proximal half of the transverse  colon allowing it to be pulled under the umbilicus.  The distal ileum  was mobile.  I did not see any liver lesions.  No obvious metastasis of  lesions in the pelvis.   Following this, I removed the [facility] trocar and made a lower midline  incision for an extraction site.  The wound protection device was  placed.  I then removed the cecum, terminal ileum, right colon and  proximal transverse colon through this small incision.  The mass was  palpated and was somewhat exophytic, very suspicious for malignancy.  I  divided the terminal ileum with the GIA stapler and divided the proximal  third of the transverse colon with a GIA stapler.  I then dissected  omentum free from the colon and divided the mesentery using the  LigaSure.  The right colic and ileocolic vessels were also ligated with  Vicryl ties.  This provided for a large wedge shaped piece of mesentery.  The specimen was handed off the field and sent to pathology.   I then performed a side-to-side stapled anastomosis using the GIA  stapler.  The common defect was closed with a running 3-0 Vicryl suture  followed by interrupted 3-0 silk sutures in Lembert fashion, thus a two  layer closure.  The anastomosis was patent, viable, under no tension,  and placed back into the abdominal cavity.   Following this, I irrigated out the abdominal cavity and evacuated the  fluid.  No bleeding was noted.  Gloves were changed.  I requested a  needle, sponge and instrument count and it was reported to be correct.  I then closed the fascia of the extraction site incision in the lower  midline with a running #1 PDS suture.  The abdomen was reinsufflated and  the area of the anastomosis inspected and the fascial closure was solid.  The remaining irrigation fluid was evacuated.  I then released the   pneumoperitoneum and removed the trocars.  All skin incisions were  closed with staples followed by sterile dressings.  She tolerated the  procedure well without apparent complications and was taken to recovery  in satisfactory condition.      Melissa Robinson, M.D.  Electronically Signed     TJR/MEDQ  D:  06/02/2006  T:  06/02/2006  Job:  [id number]   cc:   Melissa Robinson, M.D.  Fax: [phone]   Melissa Robinson. Melissa Goodell, MD  [address]  [place]  [place] [address]

## 2010-06-13 NOTE — H&P (Signed)
Melissa Robinson, Melissa Robinson                 ACCOUNT NO.:  [id number]   MEDICAL RECORD NO.:  [id number]          PATIENT TYPE:  INP   LOCATION:  1529                         [facility]ACILITY:  [facility]   PHYSICIAN:  Adolph Pollack, M.D.DATE OF BIRTH:  1916/11/24   DATE OF ADMISSION:  06/02/2006  DATE OF DISCHARGE:                              HISTORY & PHYSICAL   REASON FOR ADMISSION:  Elective partial colectomy.   HISTORY OF PRESENT ILLNESS:  Melissa Robinson is an 77 year old female who  had had anemia as well as weight loss and decreased appetite.  Evaluation for anemia included a colonoscopy which demonstrated a very  large villous lesion involving the cecum and ascending colon.  Approximately 40-50% of the circumference was involved.  A biopsy was  performed and this was an adenoma type polyp with no malignant features.  However, because this was felt to be her are of blood loss and the polyp  could not be completely removed, she is admitted for elective  laparoscopic-assisted partial colectomy.   PAST MEDICAL HISTORY:  1. Hypertension.  2. Osteoporosis.  3. Arthritis.  4. Gastroesophageal reflux disease.  5. Hypercholesterolemia.  6. Anemia.  7. Insomnia.   PREVIOUS OPERATIONS:  Tonsillectomy.   ALLERGIES:  NONE KNOWN.   CURRENT MEDICATIONS:  Atenolol, hydrochlorothiazide, aspirin, calcium,  multivitamin, nifedipine, Celebrex, Protonix, vitamin D, and Evista,  Fortical, Tandem Plus.   SOCIAL HISTORY:  Single, lives alone, is very independent.  No tobacco  abuse.   FAMILY HISTORY:  Notable for heart disease, breast cancer, head and neck  cancer.   PHYSICAL EXAMINATION:  GENERAL APPEARANCE:  An elderly female in no  acute distress.  She is very pleasant and cooperative.  VITAL SIGNS:  Temperature is 98.4, blood pressure is 178/79, pulse of  68.  EYES:  Extraocular movements intact.  No icterus.  NECK:  Supple without obvious masses.  RESPIRATORY:  Breath sounds equal and clear.   Respirations unlabored.  CARDIOVASCULAR:  Regular rate and regular rhythm.  No lower extremity.  ABDOMEN:  Soft, nontender, nondistended, no masses or organomegaly.  No  hernias.  MUSCULOSKELETAL:  SCD hose are on.   IMPRESSION:  Large bleeding cecal mass, could not be removed  endoscopically.   PLAN:  Laparoscopic-assisted partial colectomy.  I have discussed the  procedure and risks with her and her daughter preop.      Adolph Pollack, M.D.  Electronically Signed     TJR/MEDQ  D:  06/02/2006  T:  06/02/2006  Job:  [id number]

## 2010-09-04 ENCOUNTER — Other Ambulatory Visit: Payer: Self-pay | Admitting: Internal Medicine

## 2010-09-04 DIAGNOSIS — N63 Unspecified lump in unspecified breast: Secondary | ICD-10-CM

## 2010-09-10 ENCOUNTER — Other Ambulatory Visit: Payer: Self-pay

## 2010-10-20 LAB — CREATININE, SERUM: Creatinine, Ser: 2.12 — ABNORMAL HIGH

## 2010-12-05 ENCOUNTER — Other Ambulatory Visit: Payer: Self-pay | Admitting: Dermatology

## 2012-05-21 ENCOUNTER — Observation Stay (HOSPITAL_COMMUNITY)
Admission: EM | Admit: 2012-05-21 | Discharge: 2012-05-22 | Disposition: A | Discharge status: Home Health | Payer: Medicare Other | Attending: Internal Medicine | Admitting: Internal Medicine

## 2012-05-21 ENCOUNTER — Encounter (HOSPITAL_COMMUNITY)
Admission: EM | Disposition: A | Discharge status: Home Health | Payer: Self-pay | Source: Home / Self Care | Attending: Internal Medicine

## 2012-05-21 ENCOUNTER — Emergency Department (HOSPITAL_COMMUNITY): Payer: Medicare Other

## 2012-05-21 ENCOUNTER — Encounter (HOSPITAL_COMMUNITY): Payer: Self-pay | Admitting: Physical Medicine and Rehabilitation

## 2012-05-21 DIAGNOSIS — N184 Chronic kidney disease, stage 4 (severe): Secondary | ICD-10-CM | POA: Diagnosis present

## 2012-05-21 DIAGNOSIS — K219 Gastro-esophageal reflux disease without esophagitis: Secondary | ICD-10-CM

## 2012-05-21 DIAGNOSIS — N183 Chronic kidney disease, stage 3 unspecified: Secondary | ICD-10-CM | POA: Insufficient documentation

## 2012-05-21 DIAGNOSIS — D509 Iron deficiency anemia, unspecified: Secondary | ICD-10-CM | POA: Insufficient documentation

## 2012-05-21 DIAGNOSIS — Z85038 Personal history of other malignant neoplasm of large intestine: Secondary | ICD-10-CM | POA: Insufficient documentation

## 2012-05-21 DIAGNOSIS — I480 Paroxysmal atrial fibrillation: Secondary | ICD-10-CM | POA: Diagnosis present

## 2012-05-21 DIAGNOSIS — G5790 Unspecified mononeuropathy of unspecified lower limb: Secondary | ICD-10-CM | POA: Insufficient documentation

## 2012-05-21 DIAGNOSIS — K224 Dyskinesia of esophagus: Secondary | ICD-10-CM

## 2012-05-21 DIAGNOSIS — T18108A Unspecified foreign body in esophagus causing other injury, initial encounter: Principal | ICD-10-CM | POA: Insufficient documentation

## 2012-05-21 DIAGNOSIS — I509 Heart failure, unspecified: Secondary | ICD-10-CM | POA: Insufficient documentation

## 2012-05-21 DIAGNOSIS — T18128A Food in esophagus causing other injury, initial encounter: Secondary | ICD-10-CM

## 2012-05-21 DIAGNOSIS — I5022 Chronic systolic (congestive) heart failure: Secondary | ICD-10-CM | POA: Diagnosis present

## 2012-05-21 DIAGNOSIS — I129 Hypertensive chronic kidney disease with stage 1 through stage 4 chronic kidney disease, or unspecified chronic kidney disease: Secondary | ICD-10-CM | POA: Insufficient documentation

## 2012-05-21 DIAGNOSIS — Z66 Do not resuscitate: Secondary | ICD-10-CM | POA: Insufficient documentation

## 2012-05-21 DIAGNOSIS — K222 Esophageal obstruction: Secondary | ICD-10-CM | POA: Insufficient documentation

## 2012-05-21 DIAGNOSIS — R1310 Dysphagia, unspecified: Secondary | ICD-10-CM | POA: Insufficient documentation

## 2012-05-21 DIAGNOSIS — I10 Essential (primary) hypertension: Secondary | ICD-10-CM | POA: Diagnosis present

## 2012-05-21 DIAGNOSIS — I498 Other specified cardiac arrhythmias: Secondary | ICD-10-CM | POA: Insufficient documentation

## 2012-05-21 DIAGNOSIS — IMO0002 Reserved for concepts with insufficient information to code with codable children: Secondary | ICD-10-CM | POA: Insufficient documentation

## 2012-05-21 DIAGNOSIS — E785 Hyperlipidemia, unspecified: Secondary | ICD-10-CM | POA: Insufficient documentation

## 2012-05-21 DIAGNOSIS — I4891 Unspecified atrial fibrillation: Secondary | ICD-10-CM | POA: Insufficient documentation

## 2012-05-21 HISTORY — DX: Dyskinesia of esophagus: K22.4

## 2012-05-21 HISTORY — DX: Hyperlipidemia, unspecified: E78.5

## 2012-05-21 HISTORY — DX: Essential (primary) hypertension: I10

## 2012-05-21 HISTORY — DX: Polyneuropathy, unspecified: G62.9

## 2012-05-21 HISTORY — PX: ESOPHAGOGASTRODUODENOSCOPY: SHX5428

## 2012-05-21 LAB — COMPREHENSIVE METABOLIC PANEL
ALT: 10 U/L (ref 0–35)
Alkaline Phosphatase: 38 U/L — ABNORMAL LOW (ref 39–117)
BUN: 27 mg/dL — ABNORMAL HIGH (ref 6–23)
CO2: 24 mEq/L (ref 19–32)
Calcium: 9.4 mg/dL (ref 8.4–10.5)
GFR calc Af Amer: 31 mL/min — ABNORMAL LOW (ref >90)
GFR calc non Af Amer: 27 mL/min — ABNORMAL LOW (ref >90)
Glucose, Bld: 110 mg/dL — ABNORMAL HIGH (ref 70–99)
Sodium: 143 mEq/L (ref 135–145)

## 2012-05-21 LAB — CBC WITH DIFFERENTIAL/PLATELET
Eosinophils Relative: 2 % (ref 0–5)
HCT: 34.2 % — ABNORMAL LOW (ref 36.0–46.0)
Hemoglobin: 11.7 g/dL — ABNORMAL LOW (ref 12.0–15.0)
Lymphocytes Relative: 26 % (ref 12–46)
Lymphs Abs: 1.4 10*3/uL (ref 0.7–4.0)
MCV: 89.5 fL (ref 78.0–100.0)
Monocytes Relative: 7 % (ref 3–12)
Platelets: 144 10*3/uL — ABNORMAL LOW (ref 150–400)
RBC: 3.82 MIL/uL — ABNORMAL LOW (ref 3.87–5.11)
WBC: 5.4 10*3/uL (ref 4.0–10.5)

## 2012-05-21 SURGERY — EGD (ESOPHAGOGASTRODUODENOSCOPY)
Anesthesia: Moderate Sedation

## 2012-05-21 MED ORDER — MIDAZOLAM HCL 10 MG/2ML IJ SOLN
INTRAMUSCULAR | Status: DC | PRN
  Administered 2012-05-21: 2 mg via INTRAVENOUS

## 2012-05-21 MED ORDER — MORPHINE SULFATE 2 MG/ML IJ SOLN: 1.0000 mg | INTRAMUSCULAR | Status: DC | PRN

## 2012-05-21 MED ORDER — PANTOPRAZOLE SODIUM 40 MG IV SOLR
40.0000 mg | INTRAVENOUS | Status: DC
  Administered 2012-05-21: 40 mg via INTRAVENOUS
  Filled 2012-05-21 (×2): qty 40

## 2012-05-21 MED ORDER — FENTANYL CITRATE 0.05 MG/ML IJ SOLN
INTRAMUSCULAR | Status: DC | PRN
  Administered 2012-05-21: 25 ug via INTRAVENOUS

## 2012-05-21 MED ORDER — DILTIAZEM HCL 100 MG IV SOLR
5.0000 mg/h | INTRAVENOUS | Status: DC
  Administered 2012-05-21: 5 mg/h via INTRAVENOUS

## 2012-05-21 MED ORDER — ONDANSETRON HCL 4 MG PO TABS: 4.0000 mg | ORAL_TABLET | Freq: Four times a day (QID) | ORAL | Status: DC | PRN

## 2012-05-21 MED ORDER — SODIUM CHLORIDE 0.9 % IJ SOLN
3.0000 mL | Freq: Two times a day (BID) | INTRAMUSCULAR | Status: DC
  Administered 2012-05-22: 3 mL via INTRAVENOUS

## 2012-05-21 MED ORDER — SODIUM CHLORIDE 0.9 % IV SOLN: INTRAVENOUS | Status: DC

## 2012-05-21 MED ORDER — ENOXAPARIN SODIUM 30 MG/0.3ML ~~LOC~~ SOLN
30.0000 mg | SUBCUTANEOUS | Status: DC
  Administered 2012-05-21: 30 mg via SUBCUTANEOUS
  Filled 2012-05-21 (×2): qty 0.3

## 2012-05-21 MED ORDER — SODIUM CHLORIDE 0.9 % IV SOLN: Freq: Once | INTRAVENOUS | Status: DC

## 2012-05-21 MED ORDER — FENTANYL CITRATE 0.05 MG/ML IJ SOLN
INTRAMUSCULAR | Status: AC
  Filled 2012-05-21: qty 4

## 2012-05-21 MED ORDER — BUTAMBEN-TETRACAINE-BENZOCAINE 2-2-14 % EX AERO
INHALATION_SPRAY | CUTANEOUS | Status: DC | PRN
  Administered 2012-05-21: 2 via TOPICAL

## 2012-05-21 MED ORDER — SODIUM CHLORIDE 0.9 % IV BOLUS (SEPSIS)
500.0000 mL | Freq: Once | INTRAVENOUS | Status: AC
  Administered 2012-05-21: 500 mL via INTRAVENOUS

## 2012-05-21 MED ORDER — MIDAZOLAM HCL 5 MG/ML IJ SOLN
INTRAMUSCULAR | Status: AC
  Filled 2012-05-21: qty 3

## 2012-05-21 MED ORDER — DIPHENHYDRAMINE HCL 50 MG/ML IJ SOLN
INTRAMUSCULAR | Status: AC
  Filled 2012-05-21: qty 1

## 2012-05-21 MED ORDER — ONDANSETRON HCL 4 MG/2ML IJ SOLN: 4.0000 mg | Freq: Four times a day (QID) | INTRAMUSCULAR | Status: DC | PRN

## 2012-05-21 NOTE — ED Notes (Signed)
Pt GFR 27 - CT change to wo dye.

## 2012-05-21 NOTE — H&P (Signed)
PCP:   [name], MD   Chief Complaint:  Unable to swallow  HPI: Melissa Robinson is a pleasant 79 year old white female who is well-known to me she was my first patient at [facility]. She has a remarkably independent white female with a history of hypertension, congestive heart failure (ejection fraction 30-45% in 2008) which has been well compensated, and chronic dysphagia s/p prior EGD with dilation.  The patient has had chronic dysphasia issues which have been more oropharyngeal. She has been seen by speech therapy and had a modified barium swallow study in 2001 with recommendations for chin tuck with swallowing. She was fairly stable until about 2 weeks ago when she started having more solid food dysphagia. She states that the food feels it's getting stuck in the back of her throat or upper esophagus. She saw ENT recently for evaluation of this per her family's request. They recommended swallow study. I actually saw her several days ago and suggested that we do a modified barium swallow for further evaluation. She declined that at that time.  Over the past 48 hours her swallowing issues have acutely worsened to the point that she is now unable to swallow any liquids or solids. Her daughter brought her to the emergency room this morning where she had a CT of the neck showing food material within a distended mid esophagus indicative of food impaction. She denies any odynophagia, weight loss, but melena, or nausea or vomiting. She is spitting up lots of oral secretions. GI consult is pending.  Incidentally she was found in the emergency department to have an irregular heartbeat. EKG shows supraventricular tachycardia (A. fib versus atrial flutter versus sinus tach with frequent PACs).  She has no prior history of atrial fibrillation but does have a long-standing history of PACs. She is relatively asymptomatic with this although she has had some increasing dizziness recently. In our office  last week she was found to be orthostatic. At that time her Lasix was discontinued. Given her heart rhythm issues and esophageal impaction we were called for admission.  Review of Systems:  Review of Systems - all systems were reviewed with the patient and are negative except in history of present illness with the following exceptions: Chronic neuropathy in her feet, chronic back pain, recent irritability.  Past Medical History: HTN Osteoporosis--> T10/11/12 compression fracture s/p kyphoplasty (12/10), Right hip fracture s/p ORIF (1/12)-->Prolia 12/12- L renal cyst Chronic renal insufficiency, stage 3 Gait instability Dysphagia Hypertriglyceridemia CHF Colon cancer, stage III s/p hemicolectomy Sleep apnea Arthritis Cholelithiasis Gout GERD Urinary urge incontinence Anemia  Past Surgical History Sm bowel resection Compression Fx (T11/T12), T10-->kyphoplasty (12/10) Rotator cuff L renal cyst aspiration EGD w/ dilation  Medications: Prior to Admission medications   Medication Sig Start Date End Date Taking? Authorizing Provider  amLODipine (NORVASC) 10 MG tablet Take 10 mg by mouth daily.   Yes Historical Provider, MD  aspirin EC 81 MG tablet Take 81 mg by mouth daily.   Yes Historical Provider, MD  atenolol (TENORMIN) 100 MG tablet Take 100 mg by mouth daily.   Yes Historical Provider, MD  CALCIUM-VITAMIN D PO Take 1 tablet by mouth 2 (two) times daily.   Yes Historical Provider, MD  cholecalciferol (VITAMIN D) 400 UNITS TABS Take 400 Units by mouth daily.   Yes Historical Provider, MD  cyanocobalamin 500 MCG tablet Take 500 mcg by mouth daily.   Yes Historical Provider, MD  denosumab (PROLIA) 60 MG/ML SOLN injection Inject 60 mg into  the skin every 6 (six) months. Administer in upper arm, thigh, or abdomen   Yes Historical Provider, MD  iron polysaccharides (NIFEREX) 150 MG capsule Take 150 mg by mouth daily.   Yes Historical Provider, MD  losartan (COZAAR) 100 MG tablet Take  50 mg by mouth daily.   Yes Historical Provider, MD  mirabegron ER (MYRBETRIQ) 25 MG TB24 Take 25 mg by mouth daily.   Yes Historical Provider, MD  Multiple Vitamin (MULTIVITAMIN WITH MINERALS) TABS Take 1 tablet by mouth daily.   Yes Historical Provider, MD  omeprazole (PRILOSEC) 20 MG capsule Take 20 mg by mouth 2 (two) times daily.   Yes Historical Provider, MD  traMADol (ULTRAM) 50 MG tablet Take 50 mg by mouth every 8 (eight) hours as needed for pain.   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 Education:  nursing school, [employer]. 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  Family History: Father:  depression, suicide. Mother deceased at 62, heart dz. Siblings:  M, throat cancer.  S, breast cancer.  Physical Exam: Filed Vitals:   05/21/12 0938 05/21/12 1506  BP: 159/88 162/62  Pulse: 46 80  Temp: 98.6 F (37 C)   TempSrc: Oral   Resp: 18 16  Height:  5\' 5"  (1.651 m)  Weight:  56.7 kg (125 lb)  SpO2: 97% 94%   General appearance: alert and no distress Head: Normocephalic, without obvious abnormality, atraumatic Eyes: conjunctivae/corneas clear. PERRL, EOM's intact.  Nose: Nares normal. Septum midline. Mucosa normal. No drainage or sinus tenderness. Throat: lips, mucosa, and tongue normal; teeth and gums normal Neck: no adenopathy, no carotid bruit, no JVD and thyroid not enlarged, symmetric, no tenderness/mass/nodules Resp: clear to auscultation bilaterally Cardio: irregularly irregular rhythm GI: soft, non-tender; bowel sounds normal; no masses,  no organomegaly Extremities: extremities normal, atraumatic, no cyanosis or edema Pulses: 2+ and symmetric Lymph nodes: Cervical adenopathy: no cervical lymphadenopathy Neurologic: Alert and oriented X 3, normal strength and tone. Normal symmetric reflexes.    Labs on Admission:   Recent Labs   05/21/12 0950  NA 143  K 3.9  CL 107  CO2 24  GLUCOSE 110*  BUN 27*  CREATININE 1.60*  CALCIUM 9.4    Recent Labs  05/21/12 0950  AST 26  ALT 10  ALKPHOS 38*  BILITOT 0.7  PROT 7.3  ALBUMIN 4.1   No results found for this basename: LIPASE, AMYLASE,  in the last 72 hours  Recent Labs  05/21/12 0950  WBC 5.4  NEUTROABS 3.5  HGB 11.7*  HCT 34.2*  MCV 89.5  PLT 144*   No results found for this basename: CKTOTAL, CKMB, CKMBINDEX, TROPONINI,  in the last 72 hours Lab Results  Component Value Date   INR 1.11 01/28/2010   INR 1.09 12/19/2008    Radiological Exams on Admission: Ct Soft Tissue Neck Wo Contrast  05/21/2012  *RADIOLOGY REPORT*  Clinical Data: 79 year old female with solid food dysphagia. Nausea and vomiting.  CT NECK WITHOUT CONTRAST  Technique:  Multidetector CT imaging of the neck was performed without intravenous contrast.  Comparison: 09/09/2006 PET-CT  Findings: The pharynx and larynx are unremarkable. There is no evidence of mass.  Food material within a distended mid esophagus is noted - question distal obstructive process or nonspecific esophageal motility disorder. The thyroid gland, vascular structures and salivary glands are within normal limits.  Enlargement  of the pulmonary arteries is suggestive of pulmonary tree hypertension. Generalized cerebral volume loss and mild chronic small vessel white matter ischemic changes are identified within the visualized brain. Degenerative changes within the cervical spine are identified.  A mucous retention cyst/polyp within the right maxillary sinus is present.  IMPRESSION: Food material within a distended mid esophagus.  The distal esophagus is not visualized - question obstructive process versus esophageal motility disorder. Consider further evaluation with esophagram.  Pulmonary arterial hypertension.  No other significant abnormalities identified.   Original Report Authenticated By: Harmon Pier, M.D.    Orders  placed during the hospital encounter of 05/21/12  . EKG 12-LEAD- personally reviewed and discussed with Dr. wall (cardiology)-she appears to have a left bundle branch block with sinus tachycardia with intermittent A. fib with RVR. Telemetry shows intermittent supraventricular tachycardia with rates in the 140s     Assessment/Plan Principal Problem: 1. Food impaction of esophagus- she appears to have acute on chronic dysphagia with inability to swallow and regurgitation of oral secretions consistent with a esophageal impaction as seen on CT.  GI consult is pending for anticipated EGD with food extraction and dilation if possible.  Active Problems: 2. Dysphagia- she has chronic dysphagia which may be oropharyngeal superimposed on her esophageal dysphagia. We'll consult speech therapy for evaluation after her EGD. 3. Paroxysmal atrial fibrillation- this appears to be new onset atrial fibrillation which is likely related to the adrenergic response from her esophageal impaction.  Will start Cardizem drip for rate control. She is a poor anticoagulation candidate due to her recurrent falls in the past. We'll resume aspirin when she is stable from a GI standpoint. 4. ANEMIA, IRON DEFICIENCY- she has chronic anemia which is stable and multifactorial secondary to chronic renal insufficiency and mild iron deficiency. Continue iron supplement when able. 5. Chronic renal insufficiency, stage III (moderate)- renal function is stable. Her Lasix was discontinued recently do to orthostasis. 6. Systolic CHF, chronic- shown ejection fraction of 30-45% in the setting of acute illness. She has been very well compensated since that time so an echocardiogram has not been repeated. It is likely that her cardiac function has improved. She's been maintained on medical therapy with a beta blocker and ARB which she has tolerated well. We'll resume when tolerating oral medications. 7. Hypertension- her blood pressure has been a  bit labile with some orthostasis recently. Her Lasix was discontinued. We'll resume home medications when able. We'll use Cardizem drip for rate control and blood pressure control for now until tolerating oral medications. 8. Disposition-anticipate discharge back home in 2-3 days if she is stable after EGD. 9. Ethics- discussed code status with patient and she is very clear that she is DNR- does not desire CPR, intubation, DCCV, or pressors.     Kariah Loredo,W DOUGLAS 05/21/2012, 3:14 PM

## 2012-05-21 NOTE — H&P (Signed)
  HISTORY OF PRESENT ILLNESS:  Melissa Robinson is a 79 y.o. female with past medical history as outlined. I have known her for problems with colon cancer. She also has a history of peptic stricture requiring dilation. Chronic progressive dysphagia. CT earlier today suggesting esophageal obstruction versus gross motility disorder with retained food. Plan emergent/urgent endoscopy to sort out and hopefully help.  REVIEW OF SYSTEMS:  All non-GI ROS negative except for fatigue Past Medical History  Diagnosis Date  . Hypertension   . Hyperlipemia   . Neuropathy     Past Surgical History  Procedure Laterality Date  . Esophagogastroduodenoscopy    . Colonosopy    . Hip fracture surgery      Social History Melissa Robinson  reports that she has never smoked. She does not have any smokeless tobacco history on file. She reports that she does not drink alcohol or use illicit drugs.  family history is not on file.  No Known Allergies     PHYSICAL EXAMINATION: Vital signs: BP 115/52  Pulse 81  Temp(Src) 98.2 F (36.8 C) (Oral)  Resp 21  Ht 5\' 5"  (1.651 m)  Wt 125 lb (56.7 kg)  BMI 20.8 kg/m2  SpO2 93%  Constitutional: generally well-appearing, no acute distress Psychiatric: alert and oriented x3, cooperative Eyes: extraocular movements intact, anicteric, conjunctiva pink Mouth: oral pharynx moist, no lesions Neck: supple no lymphadenopathy Cardiovascular: Irregular heart rate, no murmur Lungs: clear to auscultation bilaterally Abdomen: soft, nontender, nondistended, no obvious ascites, no peritoneal signs, normal bowel sounds, no organomegaly Rectal: Omitted Extremities: no lower extremity edema bilaterally Skin: no lesions on visible extremities Neuro: No focal deficits.   ASSESSMENT:  #1. Progressive dysphagia #2. Abnormal CT scan suggesting esophageal obstruction #3. History of esophageal stricture #4. History of colon cancer #5. General medical problems #6. Atrial  fibrillation. Being assessed   PLAN:  #1. Therapeutic endoscopy.The nature of the procedure, as well as the risks, benefits, and alternatives were carefully and thoroughly reviewed with the patient. Ample time for discussion and questions allowed. The patient understood, was satisfied, and agreed to proceed.

## 2012-05-21 NOTE — ED Provider Notes (Signed)
History     CSN: [id number]  Arrival date & time 05/21/12  3086   First MD Initiated Contact with Patient 05/21/12 1107      Chief Complaint  Patient presents with  . Dysphagia    (Consider location/radiation/quality/duration/timing/severity/associated sxs/prior treatment) HPI Comments: 79 year old female with a history of hypertension, hyperlipidemia and neuropathy who has had approximately 2 years of persistent gradual onset and gradually worsening dysphagia.  Initially she was able to eat normal food, then she tried and he eats soft foods and yesterday she states that she was unable to eat anything or drink any fluids because they stopped in her upper throat and came back out. She denies pain fever or swelling and has no difficulty with her voice or breathing. She denies being nauseated or vomiting, has had no injuries to her throat and has been seen recently by ear nose and throat Dr. Jenne Pane as well as her family doctor, Dr. Clelia Croft who have evaluated her for these problems. The family describes some type of proximal ENT procedure visualizing her larynx and pharynx and was told that things looked well without any signs of masses or infections.  The history is provided by the patient and a relative.    Past Medical History  Diagnosis Date  . Hypertension   . Hyperlipemia   . Neuropathy     No past surgical history on file.  No family history on file.  History  Substance Use Topics  . Smoking status: Never Smoker   . Smokeless tobacco: Not on file  . Alcohol Use: No    OB History   Grav Para Term Preterm Abortions TAB SAB Ect Mult Living                  Review of Systems  All other systems reviewed and are negative.    Allergies  Review of patient's allergies indicates no known allergies.  Home Medications   Current Outpatient Rx  Name  Route  Sig  Dispense  Refill  . amLODipine (NORVASC) 10 MG tablet   Oral   Take 10 mg by mouth daily.         Marland Kitchen aspirin  EC 81 MG tablet   Oral   Take 81 mg by mouth daily.         Marland Kitchen atenolol (TENORMIN) 100 MG tablet   Oral   Take 100 mg by mouth daily.         Marland Kitchen CALCIUM-VITAMIN D PO   Oral   Take 1 tablet by mouth 2 (two) times daily.         . cholecalciferol (VITAMIN D) 400 UNITS TABS   Oral   Take 400 Units by mouth daily.         . cyanocobalamin 500 MCG tablet   Oral   Take 500 mcg by mouth daily.         Marland Kitchen denosumab (PROLIA) 60 MG/ML SOLN injection   Subcutaneous   Inject 60 mg into the skin every 6 (six) months. Administer in upper arm, thigh, or abdomen         . iron polysaccharides (NIFEREX) 150 MG capsule   Oral   Take 150 mg by mouth daily.         Marland Kitchen losartan (COZAAR) 100 MG tablet   Oral   Take 50 mg by mouth daily.         . mirabegron ER (MYRBETRIQ) 25 MG TB24   Oral   Take  25 mg by mouth daily.         . Multiple Vitamin (MULTIVITAMIN WITH MINERALS) TABS   Oral   Take 1 tablet by mouth daily.         Marland Kitchen omeprazole (PRILOSEC) 20 MG capsule   Oral   Take 20 mg by mouth 2 (two) times daily.         . traMADol (ULTRAM) 50 MG tablet   Oral   Take 50 mg by mouth every 8 (eight) hours as needed for pain.           BP 159/88  Pulse 46  Temp(Src) 98.6 F (37 C) (Oral)  Resp 18  SpO2 97%  Physical Exam  Nursing note and vitals reviewed. Constitutional: She appears well-developed and well-nourished. No distress.  HENT:  Head: Normocephalic and atraumatic.  Mouth/Throat: Oropharynx is clear and moist. No oropharyngeal exudate.  Pharynx is wide open, no signs of asymmetry hypertrophy erythema or exudate, tongue depressor required to see pharynx, no signs of masses to the tongue or the cheeks  Eyes: Conjunctivae and EOM are normal. Pupils are equal, round, and reactive to light. Right eye exhibits no discharge. Left eye exhibits no discharge. No scleral icterus.  Neck: Normal range of motion. Neck supple. No JVD present. No thyromegaly present.   Cardiovascular: Normal heart sounds and intact distal pulses.  Exam reveals no gallop and no friction rub.   No murmur heard. Normal rate, slightly irregular rhythm  Pulmonary/Chest: Effort normal and breath sounds normal. No respiratory distress. She has no wheezes. She has no rales.  Abdominal: Soft. Bowel sounds are normal. She exhibits no distension and no mass. There is no tenderness.  Musculoskeletal: Normal range of motion. She exhibits no edema and no tenderness.  Lymphadenopathy:    She has no cervical adenopathy.  Neurological: She is alert. Coordination normal.  Skin: Skin is warm and dry. No rash noted. No erythema.  Psychiatric: She has a normal mood and affect. Her behavior is normal.    ED Course  Procedures (including critical care time)  Labs Reviewed  CBC WITH DIFFERENTIAL - Abnormal; Notable for the following:    RBC 3.82 (*)    Hemoglobin 11.7 (*)    HCT 34.2 (*)    Platelets 144 (*)    All other components within normal limits  COMPREHENSIVE METABOLIC PANEL - Abnormal; Notable for the following:    Glucose, Bld 110 (*)    BUN 27 (*)    Creatinine, Ser 1.60 (*)    Alkaline Phosphatase 38 (*)    GFR calc non Af Amer 27 (*)    GFR calc Af Amer 31 (*)    All other components within normal limits   Ct Soft Tissue Neck Wo Contrast  05/21/2012  *RADIOLOGY REPORT*  Clinical Data: 79 year old female with solid food dysphagia. Nausea and vomiting.  CT NECK WITHOUT CONTRAST  Technique:  Multidetector CT imaging of the neck was performed without intravenous contrast.  Comparison: 09/09/2006 PET-CT  Findings: The pharynx and larynx are unremarkable. There is no evidence of mass.  Food material within a distended mid esophagus is noted - question distal obstructive process or nonspecific esophageal motility disorder. The thyroid gland, vascular structures and salivary glands are within normal limits.  Enlargement of the pulmonary arteries is suggestive of pulmonary tree  hypertension. Generalized cerebral volume loss and mild chronic small vessel white matter ischemic changes are identified within the visualized brain. Degenerative changes within the cervical spine are  identified.  A mucous retention cyst/polyp within the right maxillary sinus is present.  IMPRESSION: Food material within a distended mid esophagus.  The distal esophagus is not visualized - question obstructive process versus esophageal motility disorder. Consider further evaluation with esophagram.  Pulmonary arterial hypertension.  No other significant abnormalities identified.   Original Report Authenticated By: Harmon Pier, M.D.      1. Impacted esophageal foreign body, initial encounter   2. Dysphagia   3. Atrial fibrillation with RVR       MDM  The patient appears well, she was told by her family Dr. to come in for some rehydration therapy, we'll perform a CT scan to further evaluate for source of the patient's dysphagia but appears to be a functional dysphagia and nonobstructive dysphagia. Laboratory workup shows mild renal insufficiency, slight uremia, IV fluids started.  ED ECG REPORT  I personally interpreted this EKG   Date: 05/21/2012   Rate: 87  Rhythm: normal sinus rhythm  QRS Axis: left  Intervals: Frequent PACs, prolonged QRS  ST/T Wave abnormalities: nonspecific ST/T changes  Conduction Disutrbances:left bundle branch block  Narrative Interpretation: NSR with PAC's vs afib  Old EKG Reviewed: none available  The patient had an endoscopy done by Dr. [name] more than 5 years ago showing a distal esophageal stricture which required dilation at that time. CT scan today shows a fluid-filled esophagus. Will discuss with gastroenterology.  ED ECG REPORT  I personally interpreted this EKG   Date: 05/21/2012   Rate:   Rhythm: atrial fibrillation  QRS Axis: left  Intervals: Prolonged QRS  ST/T Wave abnormalities: nonspecific ST/T changes  Conduction Disutrbances:left bundle  branch block  Narrative Interpretation:   Old EKG Reviewed: no sinus beats recognized  The patient has had several runs of atrial fibrillation with rapid ventricular response into the 140s but this seems to be paroxysmal at her baseline heart rate at this time is in the 80s. Her blood pressure has been stable.  Care has been discussed with gastroenterology with Dr. Lamar Sprinkles staff, he will come to see the patient today, Dr. Clelia Croft will admit the patient. The patient's family has requested lower cardiology for her atrial fibrillation.  Rate control held as the patient appears to be in paroxysmal A. fib  Vida Roller, MD 05/21/12 1500

## 2012-05-21 NOTE — ED Notes (Signed)
Pt to Endo Lab with staff.

## 2012-05-21 NOTE — Op Note (Signed)
[name] [address] [place], [address]   ENDOSCOPY PROCEDURE REPORT  PATIENT: Melissa Robinson, Melissa Robinson  MR#: [id number] BIRTHDATE: 1916-04-03 , 89  yrs. old GENDER: Female ENDOSCOPIST: Roxy Cedar, MD REFERRED BY:  [name], M.D. PROCEDURE DATE:  05/21/2012 PROCEDURE:  EGD w/ fb removal  (food impaction) ASA CLASS:     Class II INDICATIONS:  Dysphagia.  /abnormal CT. MEDICATIONS: Versed 2 mg IV and Fentanyl 25 mcg IV TOPICAL ANESTHETIC: Cetacaine Spray  DESCRIPTION OF PROCEDURE: After the risks benefits and alternatives of the procedure were thoroughly explained, informed consent was obtained.  The Pentax Gastroscope [id number] endoscope was introduced through the mouth and advanced to the second portion of the duodenum. Without limitations.  The instrument was slowly withdrawn as the mucosa was fully examined.      EXAM:The esophagus was filled with food.  It was somewhat dilated and atonic.  The distal esophagus revealed a ringlike peptic stricture.  No pinpoint narrowing.  The food was gradually advanced into the stomach.  Underlying mucosa was macerated in several areas.  The stomach and duodenum were normal.  Retroflexed views revealed no abnormalities.     The scope was then withdrawn from the patient and the procedure completed.  COMPLICATIONS: There were no complications.  ENDOSCOPIC IMPRESSION: 1. Esophageal food impaction status post endoscopic removal 2. Underlying dysmotility and distal esophageal stricture.  RECOMMENDATIONS: 1. Clear liquids for 24 hours then full liquids. No meats or breads.pure food 2. Prilosec OTC 20 mg daily 3. Contact my office, as for my nurse Bonita Quin, regarding outpatient endoscopy with dilation. [phone]. REPEAT EXAM:  eSigned:  Roxy Cedar, MD 05/21/2012 6:30 PM   CC:W.  Buren Kos, MD and The Patient

## 2012-05-21 NOTE — ED Notes (Signed)
Pt presents to department for evaluation of difficulty swallowing. Ongoing x2 months. Pt states PCP doesn't know what is causing this. States symptoms have become worse over the past few days. Unable to eat/drink without vomiting. Respirations unlabored. Pt is anxious upon arrival to ED.

## 2012-05-22 LAB — BASIC METABOLIC PANEL
CO2: 22 mEq/L (ref 19–32)
Calcium: 8.2 mg/dL — ABNORMAL LOW (ref 8.4–10.5)
Creatinine, Ser: 1.33 mg/dL — ABNORMAL HIGH (ref 0.50–1.10)
GFR calc non Af Amer: 33 mL/min — ABNORMAL LOW (ref >90)
Sodium: 142 mEq/L (ref 135–145)

## 2012-05-22 LAB — CBC
MCH: 30.7 pg (ref 26.0–34.0)
MCHC: 34.6 g/dL (ref 30.0–36.0)
MCV: 88.8 fL (ref 78.0–100.0)
Platelets: 121 10*3/uL — ABNORMAL LOW (ref 150–400)

## 2012-05-22 NOTE — Progress Notes (Addendum)
    CARE MANAGEMENT NOTE 05/22/2012  Patient:  Melissa Robinson, Melissa Robinson   Account Number:  [id number]  Date Initiated:  05/22/2012  Documentation initiated by:  Magnus Ivan  Subjective/Objective Assessment:   Dysphagia     Action/Plan:   Home Health Physical Therapy, Nursing, Occupational Therapy and Speech Therapy.   Anticipated DC Date:  05/22/2012   Anticipated DC Plan:  HOME/SELF CARE      DC Planning Services  CM consult      [facility] Choice  HOME HEALTH   Choice offered to / List presented to:  C-1 Patient        HH arranged  HH-1 RN  HH-2 PT  HH-3 OT  HH-5 SPEECH THERAPY      HH agency  [facility]   Status of service:  Completed, signed off Medicare Important Message given?   (If response is "NO", the following Medicare IM given date fields will be blank) Date Medicare IM given:   Date Additional Medicare IM given:    Discharge Disposition:  HOME W HOME HEALTH SERVICES  Per UR Regulation:    If discussed at Long Length of Stay Meetings, dates discussed:    Comments:  05/21/12 11:00am Spoke with patient and daughter [name] who was present regarding home health options for physical therapy, speech therapy, occupational therapy and nursing. Provided list of agencies and patient chose [facility].  Patient denies any DME needs at this time stating she has a cane, walker, shower seat that slides so that she does not have to step in and out of the tub and rails in place. Patient does live alone but her daughter [name] [phone]) as well as her daughter [name] [phone])  will be available to assist patient at all times. Advised that [facility] would contact patient within 24-48 hours regarding schedule.  Faxed referral to [facility] @ [phone]. Magnus Ivan, RN, CCM Case Mgmt  [phone].

## 2012-05-22 NOTE — Discharge Summary (Signed)
DISCHARGE SUMMARY  Melissa Robinson  MR#: [id number]  DOB:1916/10/01  Date of Admission: 05/21/2012 Date of Discharge: 05/22/2012  Attending Physician:Melissa Robinson  Patient's NWG:NFAO,Z Melissa Lam, MD  Consults:  Melissa Robinson  Discharge Diagnoses: Principal Problem:   Food impaction of esophagus Active Problems:   ANEMIA, IRON DEFICIENCY   Dysphagia   Paroxysmal atrial fibrillation   Chronic renal insufficiency, stage III (moderate)   Systolic CHF, chronic   Hypertension  Past Medical History HTN  Osteoporosis--> T10/11/12 compression fracture s/p kyphoplasty (12/10), Right hip fracture s/p ORIF (1/12)-->Prolia 12/12-  L renal cyst  Chronic renal insufficiency, stage 3  Gait instability  Dysphagia  Hypertriglyceridemia  CHF  Colon cancer, stage III s/p hemicolectomy  Sleep apnea  Arthritis  Cholelithiasis  Gout  GERD  Urinary urge incontinence  Anemia   Past Surgical History  Sm bowel resection  Compression Fx (T11/T12), T10-->kyphoplasty (12/10)  Rotator cuff  L renal cyst aspiration  EGD w/ dilation   Discharge Medications:   Medication List    STOP taking these medications       aspirin EC 81 MG tablet      TAKE these medications       amLODipine 10 MG tablet  Commonly known as:  NORVASC  Take 10 mg by mouth daily.     atenolol 100 MG tablet  Commonly known as:  TENORMIN  Take 100 mg by mouth daily.     CALCIUM-VITAMIN D PO  Take 1 tablet by mouth 2 (two) times daily.     cholecalciferol 400 UNITS Tabs  Commonly known as:  VITAMIN D  Take 400 Units by mouth daily.     cyanocobalamin 500 MCG tablet  Take 500 mcg by mouth daily.     denosumab 60 MG/ML Soln injection  Commonly known as:  PROLIA  Inject 60 mg into the skin every 6 (six) months. Administer in upper arm, thigh, or abdomen     iron polysaccharides 150 MG capsule  Commonly known as:  NIFEREX  Take 150 mg by mouth daily.     losartan 100 MG tablet  Commonly known as:   COZAAR  Take 50 mg by mouth daily.     mirabegron ER 25 MG Tb24  Commonly known as:  MYRBETRIQ  Take 25 mg by mouth daily.     multivitamin with minerals Tabs  Take 1 tablet by mouth daily.     omeprazole 20 MG capsule  Commonly known as:  PRILOSEC  Take 20 mg by mouth 2 (two) times daily.     traMADol 50 MG tablet  Commonly known as:  ULTRAM  Take 50 mg by mouth every 8 (eight) hours as needed for pain.        Hospital Procedures: Ct Soft Tissue Neck Wo Contrast  05/21/2012  *RADIOLOGY REPORT*  Clinical Data: 79 year old female with solid food dysphagia. Nausea and vomiting.  CT NECK WITHOUT CONTRAST  Technique:  Multidetector CT imaging of the neck was performed without intravenous contrast.  Comparison: 09/09/2006 PET-CT  Findings: The pharynx and larynx are unremarkable. There is no evidence of mass.  Food material within a distended mid esophagus is noted - question distal obstructive process or nonspecific esophageal motility disorder. The thyroid gland, vascular structures and salivary glands are within normal limits.  Enlargement of the pulmonary arteries is suggestive of pulmonary tree hypertension. Generalized cerebral volume loss and mild chronic small vessel white matter ischemic changes are identified within the visualized brain. Degenerative changes  within the cervical spine are identified.  A mucous retention cyst/polyp within the right maxillary sinus is present.  IMPRESSION: Food material within a distended mid esophagus.  The distal esophagus is not visualized - question obstructive process versus esophageal motility disorder. Consider further evaluation with esophagram.  Pulmonary arterial hypertension.  No other significant abnormalities identified.   Original Report Authenticated By: Harmon Pier, M.D.     History of Present Illness: Melissa Robinson is a pleasant 79 year old white female who is well-known to me she was my first patient at [facility]. She  has a remarkably independent white female with a history of hypertension, congestive heart failure (ejection fraction 30-45% in 2008) which has been well compensated, and chronic dysphagia s/p prior EGD with dilation. The patient has had chronic dysphasia issues which have been more oropharyngeal. She has been seen by speech therapy and had a modified barium swallow study in 2001 with recommendations for chin tuck with swallowing. She was fairly stable until about 2 weeks ago when she started having more solid food dysphagia. She states that the food feels it's getting stuck in the back of her throat or upper esophagus. She saw ENT recently for evaluation of this per her family's request. They recommended swallow study. I actually saw her several days ago and suggested that we do a modified barium swallow for further evaluation. She declined that at that time. Over the past 48 hours her swallowing issues have acutely worsened to the point that she is now unable to swallow any liquids or solids. Her daughter brought her to the emergency room this morning where she had a CT of the neck showing food material within a distended mid esophagus indicative of food impaction. She denies any odynophagia, weight loss, but melena, or nausea or vomiting. She is spitting up lots of oral secretions. Robinson consult is pending.   Incidentally she was found in the emergency department to have an irregular heartbeat. EKG shows supraventricular tachycardia (A. fib versus atrial flutter versus sinus tach with frequent PACs). She has no prior history of atrial fibrillation but does have a long-standing history of PACs. She is relatively asymptomatic with this although she has had some increasing dizziness recently. In our office last week she was found to be orthostatic. At that time her Lasix was discontinued. Given her heart rhythm issues and esophageal impaction we were called for admission.   Hospital Course: This letter was  admitted to a telemetry bed.  She was promptly evaluated by Dr. Wendie Agreste, gastroenterology, who performed an urgent/emergent EGD. This revealed an esophageal food impaction. The food was gradually advanced into the stomach successfully.  A ringlike distal esophageal peptic stricture was noted. Dr. Wendie Agreste is recommended outpatient endoscopy with dilatation for this. He recommended clear liquid diet for 24 hours followed by full liquids and pured food.    For her atrial fibrillation she was placed on a Cardizem drip. Her EKG and rhythm strips were reviewed with cardiology.  She says only converted to a sinus rhythm with slow heart rate. Her Cardizem drip was discontinued. On telemetry she is noted to have periods of irregular heart beat consistent with PACs and occasional atrial fibrillation. However, her rate has remained controlled. She'll resume her prior home antihypertensive regimen which includes atenolol which should help with rate control. She is not a good anticoagulation candidate due to her high fall risk.   On the morning following her procedure she is tolerating a clear liquid diet. She is feeling  much better and asks for discharge home. She is asymptomatic in regards to her age fibrillation with no chest pain, shortness of breath, or palpitations.  She is stable for discharge home. We will arrange home health physical therapy, occupational therapy, speech therapy, and nursing care for her increased fall risk, dysphagia, and new-onset atrial fibrillation.  Day of Discharge Exam BP 156/60  Pulse 65  Temp(Src) 98.2 F (36.8 C) (Oral)  Resp 18  Ht 5' (1.524 m)  Wt 59.013 kg (130 lb 1.6 oz)  BMI 25.41 kg/m2  SpO2 92%  Physical Exam: General appearance: alert and no distress Eyes: no scleral icterus Throat: oropharynx moist without erythema Resp: clear to auscultation bilaterally Cardio: regular rate and rhythm and With frequent ectopy Robinson: soft, non-tender; bowel sounds normal; no masses,   no organomegaly Extremities: no clubbing, cyanosis or edema  Discharge Labs:  Recent Labs  05/21/12 0950 05/22/12 0510  NA 143 142  K 3.9 3.7  CL 107 110  CO2 24 22  GLUCOSE 110* 84  BUN 27* 19  CREATININE 1.60* 1.33*  CALCIUM 9.4 8.2*    Recent Labs  05/21/12 0950  AST 26  ALT 10  ALKPHOS 38*  BILITOT 0.7  PROT 7.3  ALBUMIN 4.1    Recent Labs  05/21/12 0950 05/22/12 0510  WBC 5.4 5.6  NEUTROABS 3.5  --   HGB 11.7* 10.1*  HCT 34.2* 29.2*  MCV 89.5 88.8  PLT 144* 121*   Lab Results  Component Value Date   INR 1.11 01/28/2010   INR 1.09 12/19/2008    Procedures: Endoscopy (4/26): 1. Esophageal food impaction status post endoscopic removal  2. Underlying dysmotility and distal esophageal stricture.  Telemetry Monitoring  Discharge instructions:     Discharge Orders   Future Orders Complete By Expires     Discharge instructions  As directed     Comments:      Clear liquid diet for 24 hours then full liquids for 48 hours, then pureed diet.  Call if swallowing difficulties develop. Call Dr. Lamar Sprinkles office, ask for his nurse Bonita Quin, regarding outpatient endoscopy with dilation. [phone].    Increase activity slowly  As directed        Disposition: To home with assistance from her large family.  Follow-up Appts: Follow-up with Dr. Clelia Croft at [facility] in 1 week. Our office will call for appointment.  Condition on Discharge: Stable  Tests Needing Follow-up:  She needs an outpatient endoscopy with dilatation.  Signed: Tareek Sabo,W Robinson 05/22/2012, 10:52 AM

## 2012-05-22 NOTE — Progress Notes (Signed)
Utilization review completed.  

## 2012-05-22 NOTE — Progress Notes (Signed)
Monitors called and notified that pts rhythm changed to SB with frequent PAC's with HR in low 60's to high 50's. 12 lead EKG done confirming conversion. Pt asymptomatic and VSS. Dr Clelia Croft called and notified. Orders to stop Cardizem which was done and cont to monitor and notify for changes. Will cont to monitor.

## 2012-05-23 ENCOUNTER — Encounter (HOSPITAL_COMMUNITY): Payer: Self-pay | Admitting: Internal Medicine

## 2012-05-24 ENCOUNTER — Telehealth: Payer: Self-pay | Admitting: Internal Medicine

## 2012-05-24 ENCOUNTER — Other Ambulatory Visit: Payer: Self-pay | Admitting: Internal Medicine

## 2012-05-24 NOTE — Telephone Encounter (Signed)
Pts daughter states that her mother is doing ok, she is still having a little problem with some choking. Pt is to have an EGD with dilation scheduled. How soon and where do you want to schedule this pt? Please advise.

## 2012-05-24 NOTE — Telephone Encounter (Signed)
Pt aware. Pt scheduled for 05/27/12@1 :30pm in the [facility]. Pt aware of prep instructions and family to come and sign paperwork prior to the procedure.

## 2012-05-24 NOTE — Telephone Encounter (Signed)
I have an opening in the [facility] this Friday at 1:30 PM. Put her in for that slot. Thanks. In the meantime, she should continue with soft foods and liquids only. Thanks

## 2012-05-24 NOTE — Telephone Encounter (Signed)
The patient had a procedure on 05/21/12 at the hospital.  Daughter was told to call and schedule a procedure.

## 2012-05-26 ENCOUNTER — Telehealth: Payer: Self-pay

## 2012-05-26 NOTE — Telephone Encounter (Signed)
Called and left voicemail for pts daughter Darl Pikes. Darl Pikes was supposed to come by the office and sign paperwork for her mother prior to her procedure. Called pt and she states Darl Pikes is out of town at a wedding. States she will try to get her other daughter to come by and sign the papers today if she can. Pt has procedure tomorrow.

## 2012-05-27 ENCOUNTER — Encounter: Payer: Self-pay | Admitting: Internal Medicine

## 2012-05-27 ENCOUNTER — Ambulatory Visit (AMBULATORY_SURGERY_CENTER): Payer: Medicare Other | Admitting: Internal Medicine

## 2012-05-27 VITALS — BP 94/49 | HR 60 | Temp 99.0°F | Resp 29 | Ht 65.0 in | Wt 125.0 lb

## 2012-05-27 DIAGNOSIS — K222 Esophageal obstruction: Secondary | ICD-10-CM

## 2012-05-27 DIAGNOSIS — R1310 Dysphagia, unspecified: Secondary | ICD-10-CM

## 2012-05-27 DIAGNOSIS — K269 Duodenal ulcer, unspecified as acute or chronic, without hemorrhage or perforation: Secondary | ICD-10-CM

## 2012-05-27 DIAGNOSIS — K219 Gastro-esophageal reflux disease without esophagitis: Secondary | ICD-10-CM

## 2012-05-27 MED ORDER — SODIUM CHLORIDE 0.9 % IV SOLN: 500.0000 mL | INTRAVENOUS | Status: DC

## 2012-05-27 MED ORDER — OMEPRAZOLE 40 MG PO CPDR: DELAYED_RELEASE_CAPSULE | ORAL | Status: AC

## 2012-05-27 NOTE — Patient Instructions (Addendum)
YOU HAD AN ENDOSCOPIC PROCEDURE TODAY AT THE [facility]: Refer to the procedure report that was given to you for any specific questions about what was found during the examination.  If the procedure report does not answer your questions, please call your gastroenterologist to clarify.  If you requested that your care partner not be given the details of your procedure findings, then the procedure report has been included in a sealed envelope for you to review at your convenience later.  YOU SHOULD EXPECT: Some feelings of bloating in the abdomen. Passage of more gas than usual.  Walking can help get rid of the air that was put into your GI tract during the procedure and reduce the bloating.  DIET: FOLLOW DILATION DIET TODAY- SEE HANDOUT  ACTIVITY: Your care partner should take you home directly after the procedure.  You should plan to take it easy, moving slowly for the rest of the day.  You can resume normal activity the day after the procedure however you should NOT DRIVE or use heavy machinery for 24 hours (because of the sedation medicines used during the test).    SYMPTOMS TO REPORT IMMEDIATELY: A gastroenterologist can be reached at any hour.  During normal business hours, 8:30 AM to 5:00 PM Monday through Friday, call [phone].  After hours and on weekends, please call the GI answering service at [phone] who will take a message and have the physician on call contact you.   Following lower endoscopy (colonoscopy or flexible sigmoidoscopy):  Excessive amounts of blood in the stool  Significant tenderness or worsening of abdominal pains  Swelling of the abdomen that is new, acute  Fever of 100F or higher   Black, tarry-looking stools  FOLLOW UP: If any biopsies were taken you will be contacted by phone or by letter within the next 1-3 weeks.  Call your gastroenterologist if you have not heard about the biopsies in 3 weeks.  Our staff will call the home number  listed on your records the next business day following your procedure to check on you and address any questions or concerns that you may have at that time regarding the information given to you following your procedure. This is a courtesy call and so if there is no answer at the home number and we have not heard from you through the emergency physician on call, we will assume that you have returned to your regular daily activities without incident.  SIGNATURES/CONFIDENTIALITY: You and/or your care partner have signed paperwork which will be entered into your electronic medical record.  These signatures attest to the fact that that the information above on your After Visit Summary has been reviewed and is understood.  Full responsibility of the confidentiality of this discharge information lies with you and/or your care-partner.  Avoid NSAIDS (Advil, Aleve, Ibuprofen, etc)  Await pathology results  Please pick up prescription at your pharmacy- [facility]

## 2012-05-27 NOTE — Op Note (Signed)
[facility] [address] [place], [address]   ENDOSCOPY PROCEDURE REPORT  PATIENT: Melissa Robinson, Melissa Robinson  MR#: [id number] BIRTHDATE: 1916-11-23 , 89  yrs. old GENDER: Female ENDOSCOPIST: Roxy Cedar, MD REFERRED BY:  .  [facility] PROCEDURE DATE:  05/27/2012 PROCEDURE:  EGD w/ biopsy and EGD with Balloon dilation of esophagus -18, 19 mm ASA CLASS:     Class III INDICATIONS:  Dysphagia.   Therapeutic procedure. MEDICATIONS: MAC sedation, administered by CRNA and propofol (Diprivan) 100mg  IV TOPICAL ANESTHETIC: Cetacaine Spray  DESCRIPTION OF PROCEDURE: After the risks benefits and alternatives of the procedure were thoroughly explained, informed consent was obtained.  The LB GIF-H180 [id number] endoscope was introduced through the mouth and advanced to the second portion of the duodenum. Without limitations.  The instrument was slowly withdrawn as the mucosa was fully examined.     EXAM:The esophagus revealed a fibrous ringlike stricture measuring approximately 15 mm.  This was located just above the gastroesophageal junction.  No active inflammation.  The stomach was normal.  Retroflex view revealed a small hiatal hernia.  The duodenal bulb revealed a 1.5 cm, somewhat deep, clean-based ulcer with black eschar.  Post bulbar duodenum was normal.  CLO biopsy taken. THERAPY: A sequential TTS balloon was passed through the endoscope. The distal stricture was dilated with 18, then 19 mm insufflations. Post dilation disruption of the stricture noted.  Tolerated well. The scope was then withdrawn from the patient and the procedure completed.  COMPLICATIONS: There were no complications. ENDOSCOPIC IMPRESSION: 1. Distal esophageal stricture status post dilation 2.  Acute duodenal ulcer  RECOMMENDATIONS: 1.  Clear liquids until 4 PM, then soft foods rest of day.  Resume prior diet tomorrow. 2.  Avoid NSAIDS ( medication such as Advil, Aleve, ibuprofen etc.) 3.   Rx CLO if positive 4.  Prescribe omeprazole 40 mg twice a day, #60, 11 refills  REPEAT EXAM:  eSigned:  Roxy Cedar, MD 05/27/2012 1:52 PM  CC:W.  Buren Kos, MD and The Patient

## 2012-05-27 NOTE — Progress Notes (Signed)
A/ox3 pleased with MAC report to Kristen RN 

## 2012-05-27 NOTE — Progress Notes (Signed)
Patient did not experience any of the following events: a burn prior to discharge; a fall within the facility; wrong site/side/patient/procedure/implant event; or a hospital transfer or hospital admission upon discharge from the facility. (G8907) Patient did not have preoperative order for IV antibiotic SSI prophylaxis. (G8918)  

## 2012-05-30 ENCOUNTER — Telehealth: Payer: Self-pay | Admitting: *Deleted

## 2012-05-30 LAB — HELICOBACTER PYLORI SCREEN-BIOPSY: UREASE: NEGATIVE

## 2012-05-30 NOTE — Telephone Encounter (Signed)
  Follow up Call-  Call back number 05/27/2012  Post procedure Call Back phone  # [phone]  Permission to leave phone message Yes     Patient questions:  Do you have a fever, pain , or abdominal swelling? no Pain Score  0 *  Have you tolerated food without any problems? yes  Have you been able to return to your normal activities? yes  Do you have any questions about your discharge instructions: Diet   no Medications  no Follow up visit  no  Do you have questions or concerns about your Care? no  Actions: * If pain score is 4 or above: No action needed, pain <4.

## 2012-08-15 ENCOUNTER — Telehealth: Payer: Self-pay | Admitting: Internal Medicine

## 2012-08-15 ENCOUNTER — Other Ambulatory Visit: Payer: Self-pay | Admitting: Internal Medicine

## 2012-08-15 DIAGNOSIS — R1319 Other dysphagia: Secondary | ICD-10-CM

## 2012-08-15 NOTE — Telephone Encounter (Signed)
Make sure that she is taking her omeprazole daily. Set her up for a barium swallow with tablet. He can then decide if she needs a repeat EGD and dilation. Thanks

## 2012-08-15 NOTE — Telephone Encounter (Signed)
Pts daughter aware, states she will make sure she is taking the omeprazole and confirm. Pt scheduled for Barium Swallow at [facility] 08/17/12 arrival time at 10:15am for a 10:30am appt. Pts daughter aware of appt date and time.

## 2012-08-15 NOTE — Telephone Encounter (Signed)
Pt had EGD last with Dr. [name] 05/27/12. Pts daughter states that pt is having difficulty swallowing again like she did prior to the EGD with dil. Dr. [name] would you like this pt to have an OV or another procedure scheduled. Please advise.

## 2012-08-17 ENCOUNTER — Encounter (HOSPITAL_COMMUNITY): Payer: Self-pay | Admitting: Physician Assistant

## 2012-08-17 ENCOUNTER — Inpatient Hospital Stay (HOSPITAL_COMMUNITY)
Admission: AD | Admit: 2012-08-17 | Discharge: 2012-08-23 | DRG: 291 | Disposition: A | Discharge status: Skilled Nursing Facility | Payer: Medicare Other | Source: Ambulatory Visit | Attending: Internal Medicine | Admitting: Internal Medicine

## 2012-08-17 ENCOUNTER — Other Ambulatory Visit (HOSPITAL_COMMUNITY): Payer: Medicare Other

## 2012-08-17 DIAGNOSIS — D509 Iron deficiency anemia, unspecified: Secondary | ICD-10-CM | POA: Diagnosis present

## 2012-08-17 DIAGNOSIS — I428 Other cardiomyopathies: Secondary | ICD-10-CM | POA: Diagnosis present

## 2012-08-17 DIAGNOSIS — K224 Dyskinesia of esophagus: Secondary | ICD-10-CM | POA: Diagnosis present

## 2012-08-17 DIAGNOSIS — K222 Esophageal obstruction: Secondary | ICD-10-CM

## 2012-08-17 DIAGNOSIS — N179 Acute kidney failure, unspecified: Secondary | ICD-10-CM | POA: Diagnosis present

## 2012-08-17 DIAGNOSIS — G4730 Sleep apnea, unspecified: Secondary | ICD-10-CM | POA: Diagnosis present

## 2012-08-17 DIAGNOSIS — E785 Hyperlipidemia, unspecified: Secondary | ICD-10-CM | POA: Diagnosis present

## 2012-08-17 DIAGNOSIS — Z66 Do not resuscitate: Secondary | ICD-10-CM | POA: Diagnosis present

## 2012-08-17 DIAGNOSIS — I509 Heart failure, unspecified: Secondary | ICD-10-CM | POA: Diagnosis present

## 2012-08-17 DIAGNOSIS — N184 Chronic kidney disease, stage 4 (severe): Secondary | ICD-10-CM | POA: Diagnosis present

## 2012-08-17 DIAGNOSIS — I5022 Chronic systolic (congestive) heart failure: Secondary | ICD-10-CM

## 2012-08-17 DIAGNOSIS — M810 Age-related osteoporosis without current pathological fracture: Secondary | ICD-10-CM | POA: Diagnosis present

## 2012-08-17 DIAGNOSIS — I480 Paroxysmal atrial fibrillation: Secondary | ICD-10-CM

## 2012-08-17 DIAGNOSIS — J9600 Acute respiratory failure, unspecified whether with hypoxia or hypercapnia: Secondary | ICD-10-CM | POA: Diagnosis present

## 2012-08-17 DIAGNOSIS — R5381 Other malaise: Secondary | ICD-10-CM | POA: Diagnosis present

## 2012-08-17 DIAGNOSIS — I4891 Unspecified atrial fibrillation: Secondary | ICD-10-CM

## 2012-08-17 DIAGNOSIS — Z85038 Personal history of other malignant neoplasm of large intestine: Secondary | ICD-10-CM

## 2012-08-17 DIAGNOSIS — Z79899 Other long term (current) drug therapy: Secondary | ICD-10-CM

## 2012-08-17 DIAGNOSIS — R933 Abnormal findings on diagnostic imaging of other parts of digestive tract: Secondary | ICD-10-CM

## 2012-08-17 DIAGNOSIS — I129 Hypertensive chronic kidney disease with stage 1 through stage 4 chronic kidney disease, or unspecified chronic kidney disease: Secondary | ICD-10-CM | POA: Diagnosis present

## 2012-08-17 DIAGNOSIS — K219 Gastro-esophageal reflux disease without esophagitis: Secondary | ICD-10-CM | POA: Diagnosis present

## 2012-08-17 DIAGNOSIS — R4181 Age-related cognitive decline: Secondary | ICD-10-CM | POA: Diagnosis present

## 2012-08-17 DIAGNOSIS — I5023 Acute on chronic systolic (congestive) heart failure: Principal | ICD-10-CM

## 2012-08-17 DIAGNOSIS — R1310 Dysphagia, unspecified: Secondary | ICD-10-CM

## 2012-08-17 DIAGNOSIS — M129 Arthropathy, unspecified: Secondary | ICD-10-CM | POA: Diagnosis present

## 2012-08-17 DIAGNOSIS — I059 Rheumatic mitral valve disease, unspecified: Secondary | ICD-10-CM | POA: Diagnosis present

## 2012-08-17 DIAGNOSIS — J9620 Acute and chronic respiratory failure, unspecified whether with hypoxia or hypercapnia: Secondary | ICD-10-CM | POA: Diagnosis present

## 2012-08-17 DIAGNOSIS — G589 Mononeuropathy, unspecified: Secondary | ICD-10-CM | POA: Diagnosis present

## 2012-08-17 HISTORY — DX: Left bundle-branch block, unspecified: I44.7

## 2012-08-17 HISTORY — DX: Anemia, unspecified: D64.9

## 2012-08-17 HISTORY — DX: Unspecified atrial fibrillation: I48.91

## 2012-08-17 HISTORY — DX: Cyst of kidney, acquired: N28.1

## 2012-08-17 HISTORY — DX: Heart failure, unspecified: I50.9

## 2012-08-17 HISTORY — DX: Fracture of unspecified part of neck of right femur, initial encounter for closed fracture: S72.001A

## 2012-08-17 HISTORY — DX: Esophageal obstruction: K22.2

## 2012-08-17 HISTORY — DX: Age-related osteoporosis without current pathological fracture: M81.0

## 2012-08-17 HISTORY — DX: Sleep apnea, unspecified: G47.30

## 2012-08-17 HISTORY — DX: Unspecified urinary incontinence: R32

## 2012-08-17 HISTORY — DX: Unsteadiness on feet: R26.81

## 2012-08-17 HISTORY — DX: Unspecified osteoarthritis, unspecified site: M19.90

## 2012-08-17 HISTORY — DX: Nonrheumatic aortic (valve) insufficiency: I35.1

## 2012-08-17 HISTORY — DX: Gastro-esophageal reflux disease without esophagitis: K21.9

## 2012-08-17 HISTORY — DX: Malignant neoplasm of colon, unspecified: C18.9

## 2012-08-17 HISTORY — DX: Gout, unspecified: M10.9

## 2012-08-17 HISTORY — DX: Chronic kidney disease, unspecified: N18.9

## 2012-08-17 HISTORY — DX: Nonrheumatic mitral (valve) insufficiency: I34.0

## 2012-08-17 LAB — CBC
Hemoglobin: 10.5 g/dL — ABNORMAL LOW (ref 12.0–15.0)
MCH: 30.7 pg (ref 26.0–34.0)
RBC: 3.42 MIL/uL — ABNORMAL LOW (ref 3.87–5.11)
WBC: 7.5 10*3/uL (ref 4.0–10.5)

## 2012-08-17 LAB — COMPREHENSIVE METABOLIC PANEL
ALT: 34 U/L (ref 0–35)
Alkaline Phosphatase: 65 U/L (ref 39–117)
CO2: 20 mEq/L (ref 19–32)
Calcium: 9 mg/dL (ref 8.4–10.5)
Chloride: 106 mEq/L (ref 96–112)
GFR calc Af Amer: 27 mL/min — ABNORMAL LOW (ref >90)
GFR calc non Af Amer: 23 mL/min — ABNORMAL LOW (ref >90)
Glucose, Bld: 172 mg/dL — ABNORMAL HIGH (ref 70–99)
Potassium: 4.1 mEq/L (ref 3.5–5.1)
Sodium: 139 mEq/L (ref 135–145)
Total Bilirubin: 0.8 mg/dL (ref 0.3–1.2)

## 2012-08-17 LAB — TSH: TSH: 1.9 u[IU]/mL (ref 0.350–4.500)

## 2012-08-17 MED ORDER — ACETAMINOPHEN 650 MG RE SUPP: 650.0000 mg | Freq: Four times a day (QID) | RECTAL | Status: DC | PRN

## 2012-08-17 MED ORDER — ALBUTEROL SULFATE (5 MG/ML) 0.5% IN NEBU
2.5000 mg | INHALATION_SOLUTION | RESPIRATORY_TRACT | Status: DC | PRN
  Administered 2012-08-17: 2.5 mg via RESPIRATORY_TRACT
  Filled 2012-08-17: qty 0.5

## 2012-08-17 MED ORDER — PANTOPRAZOLE SODIUM 40 MG PO TBEC
40.0000 mg | DELAYED_RELEASE_TABLET | Freq: Every day | ORAL | Status: DC
  Administered 2012-08-18 – 2012-08-23 (×6): 40 mg via ORAL
  Filled 2012-08-17 (×6): qty 1

## 2012-08-17 MED ORDER — LOSARTAN POTASSIUM 50 MG PO TABS
50.0000 mg | ORAL_TABLET | Freq: Every day | ORAL | Status: DC
  Filled 2012-08-17: qty 1

## 2012-08-17 MED ORDER — FUROSEMIDE 10 MG/ML IJ SOLN
40.0000 mg | Freq: Two times a day (BID) | INTRAMUSCULAR | Status: DC
  Administered 2012-08-17 – 2012-08-18 (×2): 40 mg via INTRAVENOUS
  Filled 2012-08-17 (×5): qty 4

## 2012-08-17 MED ORDER — POLYETHYLENE GLYCOL 3350 17 G PO PACK
17.0000 g | PACK | Freq: Every day | ORAL | Status: DC | PRN
  Filled 2012-08-17: qty 1

## 2012-08-17 MED ORDER — ACETAMINOPHEN 325 MG PO TABS: 650.0000 mg | ORAL_TABLET | Freq: Four times a day (QID) | ORAL | Status: DC | PRN

## 2012-08-17 MED ORDER — ASPIRIN EC 81 MG PO TBEC
81.0000 mg | DELAYED_RELEASE_TABLET | Freq: Every day | ORAL | Status: DC
  Administered 2012-08-17: 81 mg via ORAL
  Filled 2012-08-17 (×2): qty 1

## 2012-08-17 MED ORDER — ONDANSETRON HCL 4 MG PO TABS: 4.0000 mg | ORAL_TABLET | Freq: Four times a day (QID) | ORAL | Status: DC | PRN

## 2012-08-17 MED ORDER — HYDROCODONE-ACETAMINOPHEN 5-325 MG PO TABS: 1.0000 | ORAL_TABLET | ORAL | Status: DC | PRN

## 2012-08-17 MED ORDER — ALPRAZOLAM 0.5 MG PO TABS
0.5000 mg | ORAL_TABLET | Freq: Four times a day (QID) | ORAL | Status: DC | PRN
  Administered 2012-08-17: 0.5 mg via ORAL
  Filled 2012-08-17: qty 1

## 2012-08-17 MED ORDER — ADULT MULTIVITAMIN W/MINERALS CH
1.0000 | ORAL_TABLET | Freq: Every day | ORAL | Status: DC
  Administered 2012-08-18 – 2012-08-23 (×6): 1 via ORAL
  Filled 2012-08-17 (×6): qty 1

## 2012-08-17 MED ORDER — SODIUM CHLORIDE 0.9 % IJ SOLN
3.0000 mL | INTRAMUSCULAR | Status: DC | PRN
  Administered 2012-08-18: 3 mL via INTRAVENOUS

## 2012-08-17 MED ORDER — SODIUM CHLORIDE 0.9 % IV SOLN: 250.0000 mL | INTRAVENOUS | Status: DC | PRN

## 2012-08-17 MED ORDER — HEPARIN SODIUM (PORCINE) 5000 UNIT/ML IJ SOLN: 5000.0000 [IU] | Freq: Three times a day (TID) | INTRAMUSCULAR | Status: DC

## 2012-08-17 MED ORDER — SODIUM CHLORIDE 0.9 % IJ SOLN
3.0000 mL | Freq: Two times a day (BID) | INTRAMUSCULAR | Status: DC
  Administered 2012-08-22: 3 mL via INTRAVENOUS

## 2012-08-17 MED ORDER — ATENOLOL 100 MG PO TABS
100.0000 mg | ORAL_TABLET | Freq: Every day | ORAL | Status: DC
  Filled 2012-08-17: qty 1

## 2012-08-17 MED ORDER — ENOXAPARIN SODIUM 30 MG/0.3ML ~~LOC~~ SOLN
30.0000 mg | SUBCUTANEOUS | Status: DC
  Administered 2012-08-17: 30 mg via SUBCUTANEOUS
  Filled 2012-08-17 (×2): qty 0.3

## 2012-08-17 MED ORDER — FUROSEMIDE 10 MG/ML IJ SOLN
40.0000 mg | Freq: Once | INTRAMUSCULAR | Status: AC
  Administered 2012-08-17: 40 mg via INTRAVENOUS

## 2012-08-17 MED ORDER — CHOLECALCIFEROL 10 MCG (400 UNIT) PO TABS
400.0000 [IU] | ORAL_TABLET | Freq: Every day | ORAL | Status: DC
  Administered 2012-08-18 – 2012-08-23 (×6): 400 [IU] via ORAL
  Filled 2012-08-17 (×6): qty 1

## 2012-08-17 MED ORDER — ONDANSETRON HCL 4 MG/2ML IJ SOLN: 4.0000 mg | Freq: Four times a day (QID) | INTRAMUSCULAR | Status: DC | PRN

## 2012-08-17 MED ORDER — SODIUM CHLORIDE 0.9 % IJ SOLN
3.0000 mL | Freq: Two times a day (BID) | INTRAMUSCULAR | Status: DC
  Administered 2012-08-17 – 2012-08-23 (×7): 3 mL via INTRAVENOUS

## 2012-08-17 NOTE — Consult Note (Signed)
CARDIOLOGY CONSULT NOTE  Patient ID: Melissa Robinson, MRN: [id number], DOB/AGE: Mar 02, 1916 79 y.o. Admit date: 08/17/2012   Date of Consult: 08/17/2012 Primary Physician: [name], MD Primary Cardiologist: Per pt's daughter [name], they remotely requested Dr. Shirlee Latch while in the hospital before & saw both him & Dr. Daleen Squibb (do not see this in records, but will assign to DM)  Chief Complaint: SOB Reason for Consult: AF, CHF  HPI: Melissa Robinson is a 79 y/o F with history of atrial fibrillation documented in 04/2012, HTN, HL, CHF in 2008 (EF 35-40% at that time), moderate MR 2008, colon cancer complicated by anemia s/p colectomy 2008, CKD who presented to [facility] from her PCP's office with SOB and weakness. She has not followed with cardiology as an outpatient before. Atrial fib was recognized 04/2012 during an admission for esophageal food impaction in at which time rate control was continued. She was not felt to be an anticoagulation candidate due to fall risk at that time. She was reportedly in NSR when seen in her PCP's office recently. She went on to have esophageal dilitation 05/2012. She did well for several weeks but over the past few weeks has noticed increased dysphagia. She was scheduled for an upper GI barium swallow today, but when she awoke this morning she felt very weak and short of breath. She canceled her barium swallow and came to Dr. Alver Fisher office for evaluation where she was found to be in atrial fibrillation with evidence of congestive heart failure on chest x-ray and exam. O2 sat was 90%. She was admitted to [facility] service and we are asked to consult to assist with management. Her SOB dates back to several weeks, gradually progressing until this morning. She also has noted increased swelling recently and has remained on Lasix daily per Dr. Alver Fisher note - the patient and daughter are unsure of dose or frequency. She denies CP, awareness of palpitations, nausea, vomiting,  diaphoresis, lightheadedness, syncope, orthopnea, PND, weight change, recent rectal bleeding/melena/hematemesis/hematochezia/hematuria, or recent falls. The patient says the last fall she had was in 2012 when she broke her hip. She and her daughter both affirm that she is very careful at home and walks with a walker. She does have occasional dizziness and history of orthostasis with Lasix per chart. No history of stroke or ministroke. She still lives alone and is competent with ADLs, but has family looking in on her frequently. Labs are pending.  Past Medical History  Diagnosis Date  . Hypertension   . Hyperlipemia   . Neuropathy   . Atrial fibrillation     a. Noted during 04/2012 hospitalization, not felt to be an anticoag candidate secondary to fall risk.  . Colon cancer     a. Stage III colon CA s/p R lap-assisted colectomy 05/2006.  Marland Kitchen Anemia     Previously reported as IDA in setting of colon CA, then anemia of chronic disease.  . CKD (chronic kidney disease)     a. Stage III.  Marland Kitchen Osteoporosis     a. T10/11/12 compression fracture s/p kyphoplasty (12/10). b. R hip fracture s/p ORIF (1/12)-->Prolia 12/12.  . Peptic stricture of esophagus     a. Recurrent, s/p previous diltiation. b. Food impaction 04/2012 - had dilitation 05/2012, but dysphagia recurred 07/2012.  Marland Kitchen GERD (gastroesophageal reflux disease)   . Hip fracture, right     a. 2012 from a fall.  . CHF (congestive heart failure)     a. Echo 2008 in  setting of acute illness: EF 35-40%, mod LVH, mild AI, moderate MR, LA mildly dilated, PA pressure .  . Mitral regurgitation     a. Mod by echo 2008.  Marland Kitchen Aortic insufficiency     a. Mild by echo 2008.  Marland Kitchen Urinary incontinence   . Gout   . Arthritis   . Sleep apnea   . Gait instability   . Renal cyst, left       Most Recent Cardiac Studies: 2D Echo 05/2006 - Overall left ventricular systolic function was moderately to markedly decreased. Left ventricular ejection fraction  was estimated to be 35 % , range being 30 % to 40 %. There was moderate diffuse left ventricular hypokinesis. Left ventricular wall thickness was mildly increased. - Aortic valve thickness was mildly increased. There was mild aortic valvular regurgitation. - There was calcification of the mitral valve, involving the posterior leaflet. There was mild to moderate mitral valvular regurgitation. Mitral valve area by pressure half-time was 1.67 cm^2. - The left atrium was mildly dilated. - The estimated peak pulmonary artery systolic pressure was moderately to markedly increased. Estimated peak pulmonary artery systolic pressure 55 mmHg.    Surgical History:  Past Surgical History  Procedure Laterality Date  . Esophagogastroduodenoscopy    . Colonosopy    . Hip fracture surgery    . Esophagogastroduodenoscopy N/A 05/21/2012    Procedure: ESOPHAGOGASTRODUODENOSCOPY (EGD);  Surgeon: Hilarie Fredrickson, MD;  Location: [facility] ENDOSCOPY;  Service: Endoscopy;  Laterality: N/A;     Home Meds: Prior to Admission medications   Medication Sig Start Date End Date Taking? Authorizing Provider  amLODipine (NORVASC) 10 MG tablet Take 10 mg by mouth daily.    Historical Provider, MD  atenolol (TENORMIN) 100 MG tablet Take 100 mg by mouth daily.    Historical Provider, MD  CALCIUM-VITAMIN D PO Take 1 tablet by mouth 2 (two) times daily.    Historical Provider, MD  cholecalciferol (VITAMIN D) 400 UNITS TABS Take 400 Units by mouth daily.    Historical Provider, MD  cyanocobalamin 500 MCG tablet Take 500 mcg by mouth daily.    Historical Provider, MD  denosumab (PROLIA) 60 MG/ML SOLN injection Inject 60 mg into the skin every 6 (six) months. Administer in upper arm, thigh, or abdomen    Historical Provider, MD  iron polysaccharides (NIFEREX) 150 MG capsule Take 150 mg by mouth daily.    Historical Provider, MD  losartan (COZAAR) 100 MG tablet Take 50 mg by mouth daily.    Historical Provider, MD  mirabegron ER  (MYRBETRIQ) 25 MG TB24 Take 25 mg by mouth daily.    Historical Provider, MD  Multiple Vitamin (MULTIVITAMIN WITH MINERALS) TABS Take 1 tablet by mouth daily.    Historical Provider, MD  omeprazole (PRILOSEC) 40 MG capsule Tablet twice daily- 30 minutes before breakfast and supper 05/27/12   Hilarie Fredrickson, MD  traMADol (ULTRAM) 50 MG tablet Take 50 mg by mouth every 8 (eight) hours as needed for pain.    Historical Provider, MD    Inpatient Medications:     Allergies: No Known Allergies  History   Social History  . Marital Status: Widowed    Spouse Name: N/A    Number of Children: N/A  . Years of Education: N/A   Occupational History  . Not on file.   Social History Main Topics  . Smoking status: Never Smoker   . Smokeless tobacco: Not on file  . Alcohol Use: No  .  Drug Use: No  . Sexually Active: Not on file   Other Topics Concern  . Not on file   Social History Narrative  . No narrative on file     Family History  Problem Relation Age of Onset  . Heart disease      Mother died of heart disease later in life  . Breast cancer    . Cancer      Head/neck cancer     Review of Systems: General: negative for chills, fever, night sweats or weight changes.  Cardiovascular: see above Dermatological: negative for rash Respiratory: negative for cough or wheezing Urologic: negative for hematuria Abdominal: negative for nausea, vomiting, diarrhea, bright red blood per rectum, melena, or hematemesis Neurologic: negative for visual changes, syncope All other systems reviewed and are otherwise negative except as noted above.  Labs: Pending  Radiology/Studies:  Spoke with Dr. Felipa Eth who was answering for Dr. Clelia Croft at [facility]:  CXR - no cardiomegaly, possible small L effusion, bilateral congestive heart failure. (overread pending)  EKG: atrial fib 88bpm, LBBB, inferior infarct age-undetermined, possible anteroseptal infarct-age undetermined, LVH with secondary repol  abnormality  Physical Exam: Blood pressure 128/75, pulse 91, temperature 98.2 F (36.8 C), temperature source Oral, resp. rate 18, height 5\' 4"  (1.626 m), SpO2 93.00%. General: Well developed seemingly agile thin WF in no acute distress. Head: Normocephalic, atraumatic, sclera non-icteric, no xanthomas, nares are without discharge.  Neck: Negative for carotid bruits. JVD mildly elevated. Lungs: bilateral rales 1/2 way up without wheezes or rhonchi. Breathing is unlabored. Heart: Irregularly irregular with S1 S2. No murmurs, rubs, or gallops appreciated. Abdomen: Soft, non-tender, non-distended with normoactive bowel sounds. No hepatomegaly. No rebound/guarding. No obvious abdominal masses. Msk:  Strength and tone appear normal for age. Extremities: No clubbing or cyanosis. No edema.  Distal pedal pulses are 2+ and equal bilaterally. Neuro: Alert and oriented X 3. No facial asymmetry. No focal deficit. Moves all extremities spontaneously. Psych:  Responds to questions appropriately with a normal affect.   Assessment and Plan:   1. Paroxysmal atrial fibrillation, duration of this episode unclear 2. Acute on chronic systolic CHF (EF 29-56% in 2008) 3. Moderate MR in 2008 4. Chronic kidney disease stage III 5. Dysphagia with recurrent esophageal stricture 6. History of gait instability/falls/osteoporosis 7. Colon CA stage III s/p R colectomy 2008 8. Anemia (prev IDA, more recently called AOCD) 9. HTN  Labs pending. She is in afib and currently rate controlled on present regimen. Agree with IV diuresis so long as renal function is reasonable. She seems fairly active and we may need to rethink her candidacy for long-term anticoagulation as she has not had any falls in several years, unless there is another reason primary team is aware of to avoid anticoagulation. We can address this once CBC is back and once mobility is assessed in the hospital with diuresis (history of orthostasis with Lasix  per chart). Check TSH/free T4. Agree with echo. We considered changing atenolol to a different agent but decided to leave her on this for now given that rate is relatively stable. Will follow with you.   Signed, Ronie Spies PA-C 08/17/2012, 3:48 PM Seen with Ronie Spies, PA-C. Very delightful elderly alert woman who has noted gradual increase in dyspnea reaching a culmination last night. States she did not sleep well worrying about an impending endoscopy/esophageal dilatation. She herself is not aware of her heart rhythm and does not know when she went back into atrial fibrillation. Agree with assessment and  plan as noted above. Consider low dose apixaban?

## 2012-08-17 NOTE — Progress Notes (Signed)
Pt c/o not feeling well. When in to assess pt noted in respiratory distress extremely short of breath with use of accessory muscle. Lung sounds ascultated and noted expiratory wheezing in upper lobes and fine crackles in lower lobes. Pt appeared very anxious, borderline to an anxiety attack. Vitals assessed and noted 97.9T, 149105 T, 94P, 20R, 93% O2 sats on 2L via nasal cannula. Dr Felipa Eth on call and new orders given for an additional dose Lasix 40mg  IV, Xanax 0.5mg  Q6PRN, and Albuterol nebs Q4PRN.

## 2012-08-17 NOTE — H&P (Signed)
Vital Signs  Entered weight:  123  lbs., Calculated Weight: 123 lbs., ( 55.79 kg) Height: 62.50 in., ( 158.75 cm) Temperature: 97.0 deg F, Pulse rate: 102  Blood Pressure #1: 120 / 70 mm Hg    BMI: 22.14 BSA: 1.56 Wt Chg: -3 lbs since 07/06/2012  Vitals entered by: Leeann Must  CNA on August 17, 2012 12:19 PM  Pulse Oximetry  O2 Saturation: 90 % Comments: was placed on 2 liters of 02 and it came up to 98%, she said that she felt much better after having oxygen         History of Present Illness  History from: patient Reason for visit: See chief complaint Chief Complaint: pt woke up this am with dyspnea and weakness  History of Present Illness: Melissa Robinson is a 79 year old white female with a history of paroxysmal atrial fibrillation, congestive heart failure (ejection fraction 35%), CRI who presented to the office today with complaint of increasing shortness of breath and weakness.  Patient reports that she's had increasing swallowing difficulties recently.  She underwent esophageal dilatation on 5/14 after presenting to the emergency room several weeks earlier with an esophageal impaction.  She did well for several weeks but over the past few weeks has noticed increased dysphagia.  She was a she is scheduled for an upper GI barium swallow today, but when she awoke this morning she felt very weak and short of breath.  She canceled her barium swallow and came to our office for evaluation where she is found to be in atrial fibrillation with evidence of congestive heart failure on chest x-ray and exam.  She'll be admitted for further management.  Of note, the patient did have A. fib when she presented to the hospital in 4/14 with her esophageal obstruction.  Rate was controlled on her home regimen and she was not felt to be a good anticoagulation candidate.  She has remained relatively asymptomatic since that time and was in sinus rhythm within seen in the office recently.  She has noted  increased swelling recently and has remained on Lasix daily.   Review of Systems  General:       Complains of fatigue.        Denies fevers, chills, sweats, weight loss.   Eyes:       Denies vision loss.   Cardiovascular:       Complains of dyspnea on exertion, orthopnea, peripheral edema.        Denies chest pains.   Respiratory:       Complains of dyspnea.   Gastrointestinal:       Denies diarrhea, constipation, heartburn.   Neurologic:       Complains of gait instability.   Psychiatric:       Denies depression, anxiety.   Endocrine:       Denies polydipsia, polyuria.   Heme/Lymphatic:       Denies bleeding.    Past History Past Medical History: paroxysmal Afib Recurrent esophageal stricture s/p dilation (5/14) HTN Osteoporosis--> T10/11/12 compression fracture s/p kyphoplasty (12/10), Right hip fracture s/p ORIF (1/12)-->Prolia 12/12- L renal cyst Chronic renal insufficiency, stage 3 Gait instability Dysphagia-->EGD with dilatation (5/14) Hypertriglyceridemia CHF (EF 35% in 5/08) Colon cancer, stage III s/p hemicolectomy Sleep apnea Arthritis Cholelithiasis Gout GERD Urinary urge incontinence Anemia Surgical History: Sm bowel resection Compression Fx (T11/T12), T10-->kyphoplasty (12/10) Rotator cuff L renal cyst aspiration EGD w/ dilation (5/14) Family History (reviewed - no changes required): Father:  depression, suicide. Mother  deceased at 26, heart dz. Siblings:  M, throat cancer.  S, breast cancer. 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 Education:  nursing school, [employer]. 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  Family History Summary:     Reviewed history Last on 12/23/2010 and no changes required:08/17/2012 Mother Baker Pierini.) - Has Family History of Heart Disease - Entered On: 08/17/2012  General Comments - FH: Father:  depression, suicide. Mother deceased  at 74, heart dz. Siblings:  M, throat cancer.  S, breast cancer.  Social History:    Reviewed history from 12/06/2008 and no changes required:       "Melissa Robinson"       DR. Noriel Guthrie'S FIRST PATIENT AT [employer]       [facility] (2003- husband was internist), 10 children.       Education:  nursing school, [employer].       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   Physical Exam  General appearance: frail-appearing, tachypneic  Eyes  External: conjunctivae and lids normal Pupils: equal, round, reactive to light and accommodation  Ears, Nose and Throat  External ears: normal, no lesions or deformities External nose: normal, no lesions or deformities Otoscopic: canals clear, tympanic membranes intact, no fluid Hearing: hearing aids Nasal: mucosa, septum, and turbinates normal Dental: dentures Pharynx: dry oropharynx  Neck  Neck: supple, no masses, trachea midline Thyroid: no nodules, masses, tenderness, or enlargement  Respiratory  Respiratory effort: tachypneic Auscultation: bilateral crackles 1/3 way up  Cardiovascular  Auscultation: irreg irreg Carotid arteries: right carotid bruit Pedal pulses: pulses 2+, symmetric Periph. circulation: no cyanosis, clubbing; trace edema  Gastrointestinal  Abdomen: soft, non-tender, no masses, bowel sounds normal Liver and spleen: no enlargement or nodularity  Lymphatic  Neck: no cervical adenopathy  Musculoskeletal  Gait and station: cane; unsteady today, wobbly Digits and nails: no clubbing, cyanosis, petechiae, or nodes Head and neck: normal alignment and mobility Spine, ribs, pelvis: normal alignment and mobility, no deformity RUE: normal ROM and strength, no joint enlargement or tenderness LUE: normal ROM and strength, no joint enlargement or tenderness RLE: normal ROM and strength, no joint enlargement or tenderness LLE: normal ROM and strength, no joint enlargement or tenderness  Mental Status Exam  Judgment, insight: intact Orientation:  oriented to time, place, and person Memory: intact Mood and affect: Normal Mood   Impression & Recommendations:  Problem # 1:  Congestive heart failure, acute, systolic (ICD-428.21) (ICD10-I50.21) She is presenting with acute congestive heart failure in the setting of atrial fibrillation.  Chest x-ray shows primarily with bilateral pulmonary edema.  Prior ejection fraction in 2008 was 35%, but she has been relatively asymptomatic since that time on beta blocker and ARB.  May want to consider an alternative beta blocker for her given her change in symptoms.  She'll be admitted for IV diuresis and close monitoring of hemodynamics and volume status.  Repeat echocardiogram to evaluate cardiac function.  Cardiology consultation to assist with management. Her updated medication list for this problem includes:    Furosemide 20 Mg Tabs (Furosemide) .Marland Kitchen... Take 1 tablet by mouth everyday in the morning    Losartan Potassium 100 Mg Tabs (Losartan potassium) ..... One half  po every day    Atenolol 100 Mg Tabs (Atenolol) .Marland Kitchen... Take one tablet by mouth daily   Problem # 2:  Atrial fibrillation, paroxysmal (ICD-427.31) (ICD10-I48.0) Currently in afib with rates ~100.  Will consult cardiology to consider alternative rate controlling agents.  She is a poor anticoagulation candidate due to fall risk.   Her updated medication list for this problem includes:    Atenolol 100 Mg Tabs (Atenolol) .Marland Kitchen... Take one tablet by mouth daily   Problem # 3:  Esophageal stricture (ICD-530.3) (WUJ81-X91.2) She is having recurrent dysphagia symptoms, which are indicative of recurrent esophageal stricture.  She was being scheduled for a barium swallow today.  Once she is stable from a cardiac standpoint, we may be able to proceed with this evaluation as an inpatient.  Problem # 4:  RENAL DISEASE, CHRONIC, STAGE IV (ICD-585.4) (ICD10-N18.4) We'll need to monitor renal function closely with diuresis.  Other Orders: Pulse Ox  ([id number]) Chest X-Ray (Pa & L) (CPT-71020) EKG 12 Leads (CPT-93000)  Electronically signed by Donnie Mesa MD on 08/17/2012 at 1:19 PM

## 2012-08-17 NOTE — Progress Notes (Signed)
Pt noted resting comfortably with no further respiratory distress at this time.

## 2012-08-18 ENCOUNTER — Inpatient Hospital Stay (HOSPITAL_COMMUNITY): Payer: Medicare Other

## 2012-08-18 DIAGNOSIS — I5023 Acute on chronic systolic (congestive) heart failure: Secondary | ICD-10-CM | POA: Diagnosis present

## 2012-08-18 DIAGNOSIS — I509 Heart failure, unspecified: Secondary | ICD-10-CM

## 2012-08-18 DIAGNOSIS — I4891 Unspecified atrial fibrillation: Secondary | ICD-10-CM | POA: Diagnosis present

## 2012-08-18 DIAGNOSIS — I059 Rheumatic mitral valve disease, unspecified: Secondary | ICD-10-CM

## 2012-08-18 DIAGNOSIS — J9600 Acute respiratory failure, unspecified whether with hypoxia or hypercapnia: Secondary | ICD-10-CM | POA: Diagnosis present

## 2012-08-18 LAB — PRO B NATRIURETIC PEPTIDE: Pro B Natriuretic peptide (BNP): 26560 pg/mL — ABNORMAL HIGH (ref 0–450)

## 2012-08-18 LAB — CBC
HCT: 29.9 % — ABNORMAL LOW (ref 36.0–46.0)
Hemoglobin: 10 g/dL — ABNORMAL LOW (ref 12.0–15.0)
MCHC: 33.4 g/dL (ref 30.0–36.0)
RBC: 3.26 MIL/uL — ABNORMAL LOW (ref 3.87–5.11)
WBC: 7.1 10*3/uL (ref 4.0–10.5)

## 2012-08-18 LAB — BASIC METABOLIC PANEL
CO2: 26 mEq/L (ref 19–32)
Calcium: 9 mg/dL (ref 8.4–10.5)
Chloride: 105 mEq/L (ref 96–112)
Potassium: 3.8 mEq/L (ref 3.5–5.1)
Sodium: 141 mEq/L (ref 135–145)

## 2012-08-18 LAB — TROPONIN I: Troponin I: <0.3 ng/mL (ref <0.30)

## 2012-08-18 MED ORDER — APIXABAN 2.5 MG PO TABS
2.5000 mg | ORAL_TABLET | Freq: Two times a day (BID) | ORAL | Status: DC
  Administered 2012-08-18 – 2012-08-20 (×6): 2.5 mg via ORAL
  Filled 2012-08-18 (×8): qty 1

## 2012-08-18 MED ORDER — HYDRALAZINE HCL 25 MG PO TABS
25.0000 mg | ORAL_TABLET | Freq: Three times a day (TID) | ORAL | Status: DC
  Administered 2012-08-18 – 2012-08-23 (×16): 25 mg via ORAL
  Filled 2012-08-18 (×19): qty 1

## 2012-08-18 MED ORDER — ISOSORBIDE MONONITRATE ER 30 MG PO TB24
30.0000 mg | ORAL_TABLET | Freq: Every day | ORAL | Status: DC
  Administered 2012-08-18 – 2012-08-23 (×6): 30 mg via ORAL
  Filled 2012-08-18 (×6): qty 1

## 2012-08-18 MED ORDER — METOPROLOL SUCCINATE ER 50 MG PO TB24
50.0000 mg | ORAL_TABLET | Freq: Two times a day (BID) | ORAL | Status: DC
  Administered 2012-08-18 – 2012-08-23 (×11): 50 mg via ORAL
  Filled 2012-08-18 (×12): qty 1

## 2012-08-18 MED ORDER — POTASSIUM CHLORIDE CRYS ER 20 MEQ PO TBCR
40.0000 meq | EXTENDED_RELEASE_TABLET | Freq: Once | ORAL | Status: AC
  Administered 2012-08-18: 40 meq via ORAL
  Filled 2012-08-18: qty 2

## 2012-08-18 MED ORDER — FUROSEMIDE 10 MG/ML IJ SOLN
60.0000 mg | Freq: Three times a day (TID) | INTRAMUSCULAR | Status: DC
  Administered 2012-08-18 – 2012-08-20 (×6): 60 mg via INTRAVENOUS
  Filled 2012-08-18 (×7): qty 6

## 2012-08-18 NOTE — Care Management Note (Signed)
    Page 1 of 1   08/22/2012     3:28:28 PM   CARE MANAGEMENT NOTE 08/22/2012  Patient:  MERRIL, ISAKSON   Account Number:  [id number]  Date Initiated:  08/18/2012  Documentation initiated by:  GRAVES-BIGELOW,Jailah Willis  Subjective/Objective Assessment:   Pt admitted for CHF. Initiatd on IV laisx.  Pt is form home with family support.     Action/Plan:   CM will continue to monitor for disposition needs.   Anticipated DC Date:  08/20/2012   Anticipated DC Plan:  HOME W HOME HEALTH SERVICES  In-house referral  Clinical Social Worker      DC Planning Services  CM consult      Choice offered to / List presented to:             Status of service:  Completed, signed off Medicare Important Message given?   (If response is "NO", the following Medicare IM given date fields will be blank) Date Medicare IM given:   Date Additional Medicare IM given:    Discharge Disposition:  SKILLED NURSING FACILITY  Per UR Regulation:  Reviewed for med. necessity/level of care/duration of stay  If discussed at Long Length of Stay Meetings, dates discussed:   08/23/2012    Comments:  Pt admitted for CHF. Initiatd on IV laisx.  Dysphagia: GI to be involved per primary service-esophageal stricture- EGD dilatation today.  She is set to discharge to SNF tomorrow- [facility]. [address] [place] 08-22-12

## 2012-08-18 NOTE — Progress Notes (Signed)
Subjective: She reports increase shortness of breath last evening which has improved slightly this morning. Was enjoying her breakfast.  Objective: Vital signs in last 24 hours: Temp:  [97.7 F (36.5 C)-98.2 F (36.8 C)] 97.7 F (36.5 C) (07/24 0435) Pulse Rate:  [91-94] 94 (07/23 1945) Resp:  [18-20] 18 (07/24 0435) BP: (128-149)/(75-105) 144/85 mmHg (07/24 0435) SpO2:  [92 %-93 %] 92 % (07/24 0435) Weight:  [57.289 kg (126 lb 4.8 oz)] 57.289 kg (126 lb 4.8 oz) (07/24 0435) Weight change:  Last BM Date: 08/16/12  CBG (last 3)  No results found for this basename: GLUCAP,  in the last 72 hours  Intake/Output from previous day: 07/23 0701 - 07/24 0700 In: -  Out: 600 [Urine:600] Intake/Output this shift:    General appearance: alert and Mild tachypnea Eyes: no scleral icterus Throat: oropharynx moist without erythema Resp: Bilateral crackles throughout lung fields Cardio: irregularly irregular rhythm GI: soft, non-tender; bowel sounds normal; no masses,  no organomegaly Extremities: no clubbing, cyanosis or edema   Lab Results:  Recent Labs  08/17/12 1606 08/18/12 0555  NA 139 141  K 4.1 3.8  CL 106 105  CO2 20 26  GLUCOSE 172* 106*  BUN 36* 37*  CREATININE 1.81* 1.81*  CALCIUM 9.0 9.0    Recent Labs  08/17/12 1606  AST 32  ALT 34  ALKPHOS 65  BILITOT 0.8  PROT 6.7  ALBUMIN 3.4*    Recent Labs  08/17/12 1606 08/18/12 0555  WBC 7.5 7.1  HGB 10.5* 10.0*  HCT 31.9* 29.9*  MCV 93.3 91.7  PLT 125* 119*   Lab Results  Component Value Date   INR 1.11 01/28/2010   INR 1.09 12/19/2008    Recent Labs  08/17/12 1604 08/17/12 2347 08/18/12 0555  TROPONINI <0.30 <0.30 <0.30    Recent Labs  08/17/12 1606  TSH 1.900   No results found for this basename: VITAMINB12, FOLATE, FERRITIN, TIBC, IRON, RETICCTPCT,  in the last 72 hours  Studies/Results: No results found.   Medications: Scheduled: . apixaban  2.5 mg Oral BID  . cholecalciferol   400 Units Oral Daily  . furosemide  60 mg Intravenous Q8H  . hydrALAZINE  25 mg Oral Q8H  . isosorbide mononitrate  30 mg Oral Daily  . metoprolol succinate  50 mg Oral BID  . multivitamin with minerals  1 tablet Oral Daily  . pantoprazole  40 mg Oral Daily  . potassium chloride  40 mEq Oral Once  . sodium chloride  3 mL Intravenous Q12H  . sodium chloride  3 mL Intravenous Q12H   Continuous:   Assessment/Plan: Principal Problem: 1. Acute respiratory failure secondary to Acute on chronic systolic congestive heart failure (prior ejection fraction 35%)- continue diuresis with IV Lasix. Renal function may limit tolerability. Repeat chest x-ray and echocardiogram ordered. Appreciate cardiology assistance with management. 2. Atrial fibrillation- likely driving her heart failure. Atenolol changed to metoprolol per cardiology which I think is appropriate. We'll start low-dose of Epixaban given recent safety data when compared aspirin and potential need for possible DC cardioversion. She is at high fall risk but has been very cautious with no recent falls. 3.  Chronic renal insufficiency, stage IV (severe)- renal function is worrisome. We'll need to monitor with diuresis. ARB has been held. 4.   Dysphagia- we'll proceed with inpatient evaluation with upper GI barium swallow once her cardiac issues have stabilized. The stress of this may be contributing some to her atrial fibrillation. 5. ANEMIA,  IRON DEFICIENCY/chronic diseae- stable.  Monitor. 6. Disposition-anticipate continued need for inpatient hospitalization and management for the next 3-5 days. 7. Ethics-discussed CODE STATUS and she clearly desires DO NOT RESUSCITATE. She would not be opposed to DC cardioversion for symptomatic management of her atrial fibrillation but does not want intubation, CPR, or defibrillation.   LOS: 1 day   Melissa Robinson,W DOUGLAS 08/18/2012, 8:12 AM

## 2012-08-18 NOTE — Progress Notes (Signed)
Patient ID: Melissa Robinson, female   DOB: Oct 14, 1916, 79 y.o.   MRN: [id number]    SUBJECTIVE: Patient remains short of breath with talking this morning, comfortable at rest.  Was dyspneic during the night requiring Xanax.   . cholecalciferol  400 Units Oral Daily  . enoxaparin (LOVENOX) injection  30 mg Subcutaneous Q24H  . furosemide  60 mg Intravenous Q8H  . losartan  50 mg Oral Daily  . metoprolol succinate  50 mg Oral BID  . multivitamin with minerals  1 tablet Oral Daily  . pantoprazole  40 mg Oral Daily  . potassium chloride  40 mEq Oral Once  . sodium chloride  3 mL Intravenous Q12H  . sodium chloride  3 mL Intravenous Q12H      Filed Vitals:   08/17/12 1528 08/17/12 1945 08/17/12 2027 08/18/12 0435  BP: 128/75 149/105  144/85  Pulse: 91 94    Temp: 98.2 F (36.8 C) 97.9 F (36.6 C)  97.7 F (36.5 C)  TempSrc: Oral Axillary  Axillary  Resp: 18 20  18   Height: 5\' 4"  (1.626 m)     Weight:    57.289 kg (126 lb 4.8 oz)  SpO2: 93% 93% 93% 92%    Intake/Output Summary (Last 24 hours) at 08/18/12 0732 Last data filed at 08/18/12 0700  Gross per 24 hour  Intake      0 ml  Output    600 ml  Net   -600 ml    LABS: Basic Metabolic Panel:  Recent Labs  29/56/21 1606 08/18/12 0555  NA 139 141  K 4.1 3.8  CL 106 105  CO2 20 26  GLUCOSE 172* 106*  BUN 36* 37*  CREATININE 1.81* 1.81*  CALCIUM 9.0 9.0   Liver Function Tests:  Recent Labs  08/17/12 1606  AST 32  ALT 34  ALKPHOS 65  BILITOT 0.8  PROT 6.7  ALBUMIN 3.4*   No results found for this basename: LIPASE, AMYLASE,  in the last 72 hours CBC:  Recent Labs  08/17/12 1606 08/18/12 0555  WBC 7.5 7.1  HGB 10.5* 10.0*  HCT 31.9* 29.9*  MCV 93.3 91.7  PLT 125* PENDING   Cardiac Enzymes:  Recent Labs  08/17/12 1604 08/17/12 2347 08/18/12 0555  TROPONINI <0.30 <0.30 <0.30   BNP: No components found with this basename: POCBNP,  D-Dimer: No results found for this basename: DDIMER,  in the  last 72 hours Hemoglobin A1C: No results found for this basename: HGBA1C,  in the last 72 hours Fasting Lipid Panel: No results found for this basename: CHOL, HDL, LDLCALC, TRIG, CHOLHDL, LDLDIRECT,  in the last 72 hours Thyroid Function Tests:  Recent Labs  08/17/12 1606  TSH 1.900   Anemia Panel: No results found for this basename: VITAMINB12, FOLATE, FERRITIN, TIBC, IRON, RETICCTPCT,  in the last 72 hours  RADIOLOGY: No results found.  PHYSICAL EXAM General: NAD Neck: JVP 10-12 cm, no thyromegaly or thyroid nodule.  Lungs: Crackles at bases bilaterally. CV: Nondisplaced PMI.  Heart irregular S1/S2, no S3/S4, no murmur.  No peripheral edema.  Abdomen: Soft, nontender, no hepatosplenomegaly, no distention.  Neurologic: Alert and oriented x 3.  Psych: Normal affect. Extremities: No clubbing or cyanosis.   TELEMETRY: Reviewed telemetry pt in atrial fibrillation, rate 80s  ASSESSMENT AND PLAN: 79 yo with history of cardiomyopathy, paroxysmal atrial fibrillation, and CKD presented with shortness of breath and found to have CHF.  1. Acute on chronic presumed systolic CHF: Echo  in 2008 with EF 35%.  She is volume overloaded on exam and remains dyspneic.  She does not appear to have diuresed particularly well yesterday.  Rate-limiting factor here will likely be her renal function (creatinine 1.8 today but stable).   - Increase Lasix to 60 mg IV every 8 hrs - Echo today - CXR to assess lung fields for any component of PNA (? Aspiration).  - Stop losartan with AKI on CKD, will use hydralazine/nitrates for afterload reduction instead.  Can be titrated up as needed for blood pressure.  2. Atrial fibrillation: Suspect acute CHF may be related to going back into atrial fibrillation.   - Stop atenolol and use Toprol XL 50 mg bid given renal dysfunction.  Rate controlled currently.  - She is steady on her feet with no recent falls.  For now will start lower dosed apixaban 2.5 mg bid and stop  aspirin.  3. Renal: AKI on CKD.  Creatinine 1.8, most recent prior was 1.3.  Med adjustments as above.  Will need to follow closely with diuresis.   [name] 08/18/2012 7:40 AM

## 2012-08-18 NOTE — Progress Notes (Signed)
  Echocardiogram 2D Echocardiogram has been performed.  Hugh Garrow 08/18/2012, 9:10 AM

## 2012-08-18 NOTE — Progress Notes (Signed)
UR Completed Contrell Ballentine Graves-Bigelow, RN,BSN [phone]  

## 2012-08-18 NOTE — Progress Notes (Addendum)
MEDICATION RELATED CONSULT NOTE - FOLLOW UP   Pharmacy Consult for Apixaban Indication: Afib  No Known Allergies  Patient Measurements: Height: 5\' 4"  (162.6 cm) Weight: 126 lb 4.8 oz (57.289 kg) IBW/kg (Calculated) : 54.7  Vital Signs: Temp: 97.7 F (36.5 C) (07/24 0435) Temp src: Axillary (07/24 0435) BP: 144/85 mmHg (07/24 0435) Pulse Rate: 94 (07/23 1945) Intake/Output from previous day: 07/23 0701 - 07/24 0700 In: -  Out: 600 [Urine:600]  Labs:  Recent Labs  08/17/12 1606 08/18/12 0555  WBC 7.5 7.1  HGB 10.5* 10.0*  HCT 31.9* 29.9*  PLT 125* PENDING  CREATININE 1.81* 1.81*  ALBUMIN 3.4*  --   PROT 6.7  --   AST 32  --   ALT 34  --   ALKPHOS 65  --   BILITOT 0.8  --    Estimated Creatinine Clearance: 16.8 ml/min (by C-G formula based on Cr of 1.81).   Medications:  Prescriptions prior to admission  Medication Sig Dispense Refill  . amLODipine (NORVASC) 10 MG tablet Take 10 mg by mouth daily.      Marland Kitchen atenolol (TENORMIN) 100 MG tablet Take 100 mg by mouth daily.      Marland Kitchen CALCIUM-VITAMIN D PO Take 1 tablet by mouth 2 (two) times daily.      . cholecalciferol (VITAMIN D) 400 UNITS TABS Take 400 Units by mouth daily.      . cyanocobalamin 500 MCG tablet Take 500 mcg by mouth daily.      Marland Kitchen denosumab (PROLIA) 60 MG/ML SOLN injection Inject 60 mg into the skin every 6 (six) months. Administer in upper arm, thigh, or abdomen      . iron polysaccharides (NIFEREX) 150 MG capsule Take 150 mg by mouth daily.      Marland Kitchen losartan (COZAAR) 100 MG tablet Take 50 mg by mouth daily.      . mirabegron ER (MYRBETRIQ) 25 MG TB24 Take 25 mg by mouth daily.      . Multiple Vitamin (MULTIVITAMIN WITH MINERALS) TABS Take 1 tablet by mouth daily.      Marland Kitchen omeprazole (PRILOSEC) 40 MG capsule Tablet twice daily- 30 minutes before breakfast and supper  60 capsule  11  . traMADol (ULTRAM) 50 MG tablet Take 50 mg by mouth every 8 (eight) hours as needed for pain.       Assessment: 79yo female  admitted with Afib and hx. of CHF.  We have been asked to start on Apixaban.  This patient has a creatinine of 1.8 with an estimated clearance of 17 ml/min.  She meets the criteria of reduced dosing due to age, body mass and renal fxn.    Goal of Therapy:  Therapeutic response without bleeding  Plan:  1.  Apixaban 2.5 mg twice daily 2.  Monitor for s/s of bleeding. 3.  DC Lovenox  Nadara Mustard, PharmD., MS Clinical Pharmacist Pager:  [phone] Thank you for allowing pharmacy to be part of this patients care team. 08/18/2012,7:40 AM

## 2012-08-19 ENCOUNTER — Inpatient Hospital Stay (HOSPITAL_COMMUNITY): Payer: Medicare Other

## 2012-08-19 LAB — BASIC METABOLIC PANEL
CO2: 27 mEq/L (ref 19–32)
Chloride: 100 mEq/L (ref 96–112)
Creatinine, Ser: 1.96 mg/dL — ABNORMAL HIGH (ref 0.50–1.10)
Glucose, Bld: 115 mg/dL — ABNORMAL HIGH (ref 70–99)

## 2012-08-19 LAB — CBC
Hemoglobin: 11 g/dL — ABNORMAL LOW (ref 12.0–15.0)
MCV: 92.6 fL (ref 78.0–100.0)
Platelets: 140 10*3/uL — ABNORMAL LOW (ref 150–400)
RBC: 3.66 MIL/uL — ABNORMAL LOW (ref 3.87–5.11)
WBC: 8.4 10*3/uL (ref 4.0–10.5)

## 2012-08-19 LAB — PRO B NATRIURETIC PEPTIDE: Pro B Natriuretic peptide (BNP): 20069 pg/mL — ABNORMAL HIGH (ref 0–450)

## 2012-08-19 MED ORDER — POTASSIUM CHLORIDE CRYS ER 20 MEQ PO TBCR
40.0000 meq | EXTENDED_RELEASE_TABLET | Freq: Once | ORAL | Status: AC
  Administered 2012-08-19: 40 meq via ORAL
  Filled 2012-08-19: qty 2

## 2012-08-19 MED ORDER — FUROSEMIDE 10 MG/ML IJ SOLN
INTRAMUSCULAR | Status: AC
  Filled 2012-08-19: qty 8

## 2012-08-19 NOTE — Progress Notes (Signed)
Subjective: The patient was seen earlier this morning. At that time she continued to feel quite weak with dyspnea with minimal activity. She is reluctant to go to a SNF for rehabilitation. She has a dry cough, appetite remains fair.   Objective: Vital signs in last 24 hours: Temp:  [97.7 F (36.5 C)-98.2 F (36.8 C)] 98.2 F (36.8 C) (07/25 2127) Pulse Rate:  [79-114] 114 (07/25 2127) Resp:  [17-18] 18 (07/25 2127) BP: (102-116)/(62-69) 116/69 mmHg (07/25 2127) SpO2:  [96 %-97 %] 97 % (07/25 2127) Weight:  [56.972 kg (125 lb 9.6 oz)] 56.972 kg (125 lb 9.6 oz) (07/25 0533) Weight change: -0.318 kg (-11.2 oz)   Intake/Output from previous day: 07/24 0701 - 07/25 0700 In: 360 [P.O.:360] Out: 3150 [Urine:3150]   General appearance: alert, cooperative and no distress Resp: bibasilar crackles Cardio: irregularly irregular rhythm GI: soft, non-tender; bowel sounds normal; no masses,  no organomegaly Extremities: extremities normal, atraumatic, no cyanosis or edema  Lab Results:  Recent Labs  08/18/12 0555 08/19/12 0430  WBC 7.1 8.4  HGB 10.0* 11.0*  HCT 29.9* 33.9*  PLT 119* 140*   BMET  Recent Labs  08/18/12 0555 08/19/12 0430  NA 141 138  K 3.8 3.9  CL 105 100  CO2 26 27  GLUCOSE 106* 115*  BUN 37* 41*  CREATININE 1.81* 1.96*  CALCIUM 9.0 9.4   CMET CMP     Component Value Date/Time   NA 138 08/19/2012 0430   K 3.9 08/19/2012 0430   CL 100 08/19/2012 0430   CO2 27 08/19/2012 0430   GLUCOSE 115* 08/19/2012 0430   BUN 41* 08/19/2012 0430   CREATININE 1.96* 08/19/2012 0430   CALCIUM 9.4 08/19/2012 0430   PROT 6.7 08/17/2012 1606   ALBUMIN 3.4* 08/17/2012 1606   AST 32 08/17/2012 1606   ALT 34 08/17/2012 1606   ALKPHOS 65 08/17/2012 1606   BILITOT 0.8 08/17/2012 1606   GFRNONAA 21* 08/19/2012 0430   GFRAA 24* 08/19/2012 0430    CBG (last 3)  No results found for this basename: GLUCAP,  in the last 72 hours  INR RESULTS:   Lab Results  Component Value Date   INR 1.11 01/28/2010   INR 1.09 12/19/2008     Studies/Results: Dg Chest 2 View  08/18/2012   *RADIOLOGY REPORT*  Clinical Data: Pulmonary edema.  CHEST - 2 VIEW  Comparison: 01/28/2010  Findings: Cardiomegaly.  Patchy bilateral perihilar airspace opacities, right greater than left.  This could represent asymmetric edema or infection.  Suspect small bilateral effusions.  Numerous mild to moderate compression fractures in the lower thoracic spine, presumably chronic.  IMPRESSION: Patchy bilateral perihilar airspace opacities, right greater than left.  This could represent asymmetric edema or infection.  Small bilateral effusions.   Original Report Authenticated By: Charlett Nose, M.D.    Medications: I have reviewed the patient's current medications.  Assessment/Plan: #1 Dyspnea: due to acute CHF from combined systolic and diastolic dysfunction. Appreciate input from Dr. Shirlee Latch. We will monitor renal function and electrolytes closely on high dose IV lasix.  #2 Physical Deconditioning: moderately severe and likely will need SNF rehab. Will see how she does with PT and OT evaluation this weekend. If it appears that she cannot function with minimal assistance then we will need to convince her to try SNF rehabilitation.  #3 Dysphagia: moderate upper esophageal dysphagia could be from recurrent stricture versus effect of C-spine osteophyte impression on the esophagus versus dysmotility. A barium swallow test was  ordered this morning.  LOS: 2 days   Melissa Robinson 08/19/2012, 10:55 PM

## 2012-08-19 NOTE — Evaluation (Signed)
Physical Therapy Evaluation Patient Details Name: Melissa Robinson MRN: [id number] DOB: Oct 06, 1916 Today's Date: 08/19/2012 Time: 6578-4696 PT Time Calculation (min): 16 min  PT Assessment / Plan / Recommendation History of Present Illness  79 yo with history of cardiomyopathy, paroxysmal atrial fibrillation, and CKD presented with shortness of breath and found to have CHF   Clinical Impression  Pt admitted with acute on chronic CHF. Pt currently with functional limitations due to the deficits listed below (see PT Problem List).  Pt will benefit from skilled PT to increase their independence and safety with mobility to allow discharge to the venue listed below.  Pt reports living alone and likes to be independent.  Pt aware of need for assist upon d/c so discussed ST-SNF vs home with 24/7 care and pt to discuss with family.  Pt also reports she has talked to her children about possibly moving to some sort of facility due to her being forgetful at times so therapist mentioned places available such as independent living or ALF if pt feels she can no longer function at home.  SW consult recommended.     PT Assessment  Patient needs continued PT services    Follow Up Recommendations  Supervision/Assistance - 24 hour;SNF    Does the patient have the potential to tolerate intense rehabilitation      Barriers to Discharge Decreased caregiver support      Equipment Recommendations  None recommended by PT    Recommendations for Other Services     Frequency Min 3X/week    Precautions / Restrictions Precautions Precautions: Fall   Pertinent Vitals/Pain 2/4 dyspnea during ambulation SaO2 96% on room air at rest SaO2 98% on room air upon return to room after ambulation however reapplied O2 Goodman as pt felt SOB and wearing O2 upon entering room      Mobility  Bed Mobility Bed Mobility: Supine to Sit;Sit to Supine Supine to Sit: 5: Supervision;HOB elevated Sit to Supine: 5: Supervision;HOB  elevated Details for Bed Mobility Assistance: increased time Transfers Transfers: Sit to Stand;Stand to Sit Sit to Stand: 4: Min guard;With upper extremity assist;From bed Stand to Sit: 4: Min guard;With upper extremity assist;To bed Ambulation/Gait Ambulation/Gait Assistance: 4: Min assist Ambulation Distance (Feet): 60 Feet Assistive device: Rolling walker Ambulation/Gait Assistance Details: fatigues quickly, reports increased SOB end of ambulation and LEs weak with occasional buckling on return to room Gait Pattern: Step-through pattern;Decreased hip/knee flexion - right;Trunk flexed Gait velocity: decreased General Gait Details: DOE    Exercises     PT Diagnosis: Difficulty walking  PT Problem List: Decreased strength;Decreased mobility;Decreased activity tolerance;Cardiopulmonary status limiting activity PT Treatment Interventions: DME instruction;Gait training;Functional mobility training;Therapeutic activities;Therapeutic exercise;Patient/family education     PT Goals(Current goals can be found in the care plan section) Acute Rehab PT Goals PT Goal Formulation: With patient Time For Goal Achievement: 09/02/12 Potential to Achieve Goals: Good  Visit Information  Last PT Received On: 08/19/12 Assistance Needed: +1 History of Present Illness: 79 yo with history of cardiomyopathy, paroxysmal atrial fibrillation, and CKD presented with shortness of breath and found to have CHF       Prior Functioning  Home Living Family/patient expects to be discharged to:: Private residence Living Arrangements: Alone Type of Home: House Home Access: Stairs to enter Secretary/administrator of Steps: 2 Entrance Stairs-Rails: Right Home Layout: One level Home Equipment: Walker - 2 wheels Prior Function Level of Independence: Independent Comments: only uses RW for community Communication Communication: No difficulties  Cognition  Cognition Arousal/Alertness: Awake/alert Behavior  During Therapy: WFL for tasks assessed/performed Overall Cognitive Status: Within Functional Limits for tasks assessed Memory:  (pt reports being forgetful)    Extremity/Trunk Assessment Lower Extremity Assessment Lower Extremity Assessment: Generalized weakness   Balance    End of Session PT - End of Session Activity Tolerance: Patient limited by fatigue Patient left: in bed;with call bell/phone within reach;with family/visitor present  GP     Vivek Grealish,KATHrine E 08/19/2012, 4:01 PM Zenovia Jarred, PT, DPT 08/19/2012 Pager: [phone]

## 2012-08-19 NOTE — Progress Notes (Signed)
Patient ID: Melissa Robinson, female   DOB: 08-Jun-1916, 79 y.o.   MRN: [id number]    SUBJECTIVE: Symptomatically better this morning.  Not short of breath talking.  Has not been out of bed.  Diuresed well yesterday.    Marland Kitchen apixaban  2.5 mg Oral BID  . cholecalciferol  400 Units Oral Daily  . furosemide  60 mg Intravenous Q8H  . hydrALAZINE  25 mg Oral Q8H  . isosorbide mononitrate  30 mg Oral Daily  . metoprolol succinate  50 mg Oral BID  . multivitamin with minerals  1 tablet Oral Daily  . pantoprazole  40 mg Oral Daily  . potassium chloride  40 mEq Oral Once  . sodium chloride  3 mL Intravenous Q12H  . sodium chloride  3 mL Intravenous Q12H      Filed Vitals:   08/18/12 1517 08/18/12 1610 08/18/12 2137 08/19/12 0533  BP: 113/85 115/62 131/65 116/62  Pulse:  86 86 79  Temp:  98.8 F (37.1 C) 98.6 F (37 C) 98 F (36.7 C)  TempSrc:  Oral Oral Oral  Resp:  18 18 18   Height:      Weight:    125 lb 9.6 oz (56.972 kg)  SpO2:  92% 93% 96%    Intake/Output Summary (Last 24 hours) at 08/19/12 0733 Last data filed at 08/19/12 0533  Gross per 24 hour  Intake    360 ml  Output   2200 ml  Net  -1840 ml    LABS: Basic Metabolic Panel:  Recent Labs  46/96/29 0555 08/19/12 0430  NA 141 138  K 3.8 3.9  CL 105 100  CO2 26 27  GLUCOSE 106* 115*  BUN 37* 41*  CREATININE 1.81* 1.96*  CALCIUM 9.0 9.4   Liver Function Tests:  Recent Labs  08/17/12 1606  AST 32  ALT 34  ALKPHOS 65  BILITOT 0.8  PROT 6.7  ALBUMIN 3.4*   No results found for this basename: LIPASE, AMYLASE,  in the last 72 hours CBC:  Recent Labs  08/18/12 0555 08/19/12 0430  WBC 7.1 8.4  HGB 10.0* 11.0*  HCT 29.9* 33.9*  MCV 91.7 92.6  PLT 119* 140*   Cardiac Enzymes:  Recent Labs  08/17/12 1604 08/17/12 2347 08/18/12 0555  TROPONINI <0.30 <0.30 <0.30   BNP: No components found with this basename: POCBNP,  D-Dimer: No results found for this basename: DDIMER,  in the last 72  hours Hemoglobin A1C: No results found for this basename: HGBA1C,  in the last 72 hours Fasting Lipid Panel: No results found for this basename: CHOL, HDL, LDLCALC, TRIG, CHOLHDL, LDLDIRECT,  in the last 72 hours Thyroid Function Tests:  Recent Labs  08/17/12 1606  TSH 1.900   Anemia Panel: No results found for this basename: VITAMINB12, FOLATE, FERRITIN, TIBC, IRON, RETICCTPCT,  in the last 72 hours  RADIOLOGY: No results found.  PHYSICAL EXAM General: NAD Neck: JVP 9-10 cm, no thyromegaly or thyroid nodule.  Lungs: Clear bilaterally CV: Nondisplaced PMI.  Heart irregular S1/S2, no S3/S4, no murmur.  No peripheral edema.  Abdomen: Soft, nontender, no hepatosplenomegaly, no distention.  Neurologic: Alert and oriented x 3.  Psych: Normal affect. Extremities: No clubbing or cyanosis.   TELEMETRY: Reviewed telemetry pt in atrial fibrillation, rate 80s  ASSESSMENT AND PLAN: 78 yo with history of cardiomyopathy, paroxysmal atrial fibrillation, and CKD presented with shortness of breath and found to have CHF.  1. Acute on chronic systolic CHF: Echo with  EF 35-40%, stable from 2008.  She diuresed well yesterday and is breathing better.  Still some volume overload.  Creatinine slightly increased.    - Favor pushing IV lasix for at least one more day.   - Stopped losartan with AKI on CKD, using hydralazine/nitrates for afterload reduction instead.   2. Atrial fibrillation: Suspect acute CHF may be related to going back into atrial fibrillation.   - Stopped atenolol and using Toprol XL 50 mg bid given renal dysfunction.  Rate controlled currently.  - She is steady on her feet with no recent falls.  On lower dosed apixaban. 3. Renal: AKI on CKD.  Creatinine slightly up to 1.9 from 1.8.  Will need to follow closely with diuresis.  4. PT consult, may need rehab stay though she would not be excited about this.   [name] 08/19/2012 7:33 AM

## 2012-08-20 ENCOUNTER — Inpatient Hospital Stay (HOSPITAL_COMMUNITY): Payer: Medicare Other

## 2012-08-20 LAB — CBC
HCT: 36.4 % (ref 36.0–46.0)
Hemoglobin: 12.1 g/dL (ref 12.0–15.0)
MCHC: 33.2 g/dL (ref 30.0–36.0)
WBC: 7.6 10*3/uL (ref 4.0–10.5)

## 2012-08-20 LAB — BASIC METABOLIC PANEL
BUN: 50 mg/dL — ABNORMAL HIGH (ref 6–23)
Chloride: 98 mEq/L (ref 96–112)
Glucose, Bld: 111 mg/dL — ABNORMAL HIGH (ref 70–99)
Potassium: 3.9 mEq/L (ref 3.5–5.1)

## 2012-08-20 MED ORDER — FUROSEMIDE 40 MG PO TABS
40.0000 mg | ORAL_TABLET | Freq: Every day | ORAL | Status: DC
  Administered 2012-08-20 – 2012-08-23 (×4): 40 mg via ORAL
  Filled 2012-08-20 (×4): qty 1

## 2012-08-20 NOTE — Progress Notes (Addendum)
Subjective: No events overnight Good diuresis last night.  Dyspnea w/ walking to table in room w/ leg weakness   Objective: Vital signs in last 24 hours: Temp:  [97.7 F (36.5 C)-98.2 F (36.8 C)] 98.1 F (36.7 C) (07/26 0500) Pulse Rate:  [82-114] 82 (07/26 0500) Resp:  [17-20] 20 (07/26 0500) BP: (102-124)/(61-74) 104/71 mmHg (07/26 0617) SpO2:  [97 %-98 %] 98 % (07/26 0500) Weight:  [118 lb 3.2 oz (53.615 kg)] 118 lb 3.2 oz (53.615 kg) (07/26 0500) Weight change: -7 lb 6.4 oz (-3.357 kg) Last BM Date: 08/16/12  CBG (last 3)  No results found for this basename: GLUCAP,  in the last 72 hours  Intake/Output from previous day: 07/25 0701 - 07/26 0700 In: 720 [P.O.:720] Out: 1000 [Urine:1000] Intake/Output this shift:    General appearance: alert and Mild tachypnea Eyes: no scleral icterus Throat: oropharynx moist without erythema Resp: Bilateral crackles throughout lung fields Cardio: irregularly irregular rhythm GI: soft, non-tender; bowel sounds normal; no masses,  no organomegaly Extremities: no clubbing, cyanosis or edema   Lab Results:  Recent Labs  08/19/12 0430 08/20/12 0530  NA 138 140  K 3.9 3.9  CL 100 98  CO2 27 27  GLUCOSE 115* 111*  BUN 41* 50*  CREATININE 1.96* 2.12*  CALCIUM 9.4 9.7    Recent Labs  08/17/12 1606  AST 32  ALT 34  ALKPHOS 65  BILITOT 0.8  PROT 6.7  ALBUMIN 3.4*    Recent Labs  08/19/12 0430 08/20/12 0530  WBC 8.4 7.6  HGB 11.0* 12.1  HCT 33.9* 36.4  MCV 92.6 92.4  PLT 140* 164   Lab Results  Component Value Date   INR 1.11 01/28/2010   INR 1.09 12/19/2008    Recent Labs  08/17/12 1604 08/17/12 2347 08/18/12 0555  TROPONINI <0.30 <0.30 <0.30    Recent Labs  08/17/12 1606  TSH 1.900   No results found for this basename: VITAMINB12, FOLATE, FERRITIN, TIBC, IRON, RETICCTPCT,  in the last 72 hours  Studies/Results: Dg Chest 2 View  08/18/2012   *RADIOLOGY REPORT*  Clinical Data: Pulmonary edema.   CHEST - 2 VIEW  Comparison: 01/28/2010  Findings: Cardiomegaly.  Patchy bilateral perihilar airspace opacities, right greater than left.  This could represent asymmetric edema or infection.  Suspect small bilateral effusions.  Numerous mild to moderate compression fractures in the lower thoracic spine, presumably chronic.  IMPRESSION: Patchy bilateral perihilar airspace opacities, right greater than left.  This could represent asymmetric edema or infection.  Small bilateral effusions.   Original Report Authenticated By: Charlett Nose, M.D.     Medications: Scheduled: . apixaban  2.5 mg Oral BID  . cholecalciferol  400 Units Oral Daily  . furosemide  60 mg Intravenous Q8H  . hydrALAZINE  25 mg Oral Q8H  . isosorbide mononitrate  30 mg Oral Daily  . metoprolol succinate  50 mg Oral BID  . multivitamin with minerals  1 tablet Oral Daily  . pantoprazole  40 mg Oral Daily  . sodium chloride  3 mL Intravenous Q12H  . sodium chloride  3 mL Intravenous Q12H   Continuous:   Assessment/Plan: Principal Problem: 1. Acute respiratory failure secondary to Acute on chronic systolic congestive heart failure (prior ejection fraction 35%)- Recs per cardiology. Assistance appreciated. BUN/Cr higher today but Co2 and K ok.  BNP improving daily, continues w/ good diuresis on IV lasix.  May need to consider decreasing lasix dosing today as CKD is progressing 2. Atrial  fibrillation- rate controlled on metoprolol. On low dose apixaban 3.  Chronic renal insufficiency, stage IV (severe)-   ARB has been held. BUN/Cr worse today, may need to consider decreasing lasix dosing today  4.   Dysphagia- Barium swallow was ordered by Dr Eloise Harman on Friday. The stress of this may be contributing some to her atrial fibrillation. Will follow barium swallow results once procedure performed.  5. ANEMIA, IRON DEFICIENCY/chronic diseae- stable.  Monitor. 6. Disposition-PT rec'd SNF. Pt prefers home but today seems to be starting to  realize that home may not be best option. Will continue to discuss. Case management on board. D/C likely mid next week.  7. Ethics-discussed CODE STATUS and she clearly desires DO NOT RESUSCITATE. She would not be opposed to DC cardioversion for symptomatic management of her atrial fibrillation but does not want intubation, CPR, or defibrillation.   LOS: 3 days   Melissa Robinson 08/20/2012, 7:15 AM

## 2012-08-20 NOTE — Progress Notes (Signed)
Patient ID: Melissa Robinson, female   DOB: 19-Sep-1916, 79 y.o.   MRN: [id number]    SUBJECTIVE: Symptomatically better this morning.  Off oxygen and not short of breath at rest though she is short of breath with exertion.  BUN/creatinine rising a bit.    Marland Kitchen apixaban  2.5 mg Oral BID  . cholecalciferol  400 Units Oral Daily  . furosemide  40 mg Oral Daily  . hydrALAZINE  25 mg Oral Q8H  . isosorbide mononitrate  30 mg Oral Daily  . metoprolol succinate  50 mg Oral BID  . multivitamin with minerals  1 tablet Oral Daily  . pantoprazole  40 mg Oral Daily  . sodium chloride  3 mL Intravenous Q12H  . sodium chloride  3 mL Intravenous Q12H      Filed Vitals:   08/20/12 0500 08/20/12 0612 08/20/12 0615 08/20/12 0617  BP: 111/61 124/74 120/74 104/71  Pulse: 82     Temp: 98.1 F (36.7 C)     TempSrc: Oral     Resp: 20     Height:      Weight: 53.615 kg (118 lb 3.2 oz)     SpO2: 98%       Intake/Output Summary (Last 24 hours) at 08/20/12 0844 Last data filed at 08/19/12 1900  Gross per 24 hour  Intake    360 ml  Output   1000 ml  Net   -640 ml    LABS: Basic Metabolic Panel:  Recent Labs  40/98/11 0430 08/20/12 0530  NA 138 140  K 3.9 3.9  CL 100 98  CO2 27 27  GLUCOSE 115* 111*  BUN 41* 50*  CREATININE 1.96* 2.12*  CALCIUM 9.4 9.7   Liver Function Tests:  Recent Labs  08/17/12 1606  AST 32  ALT 34  ALKPHOS 65  BILITOT 0.8  PROT 6.7  ALBUMIN 3.4*   No results found for this basename: LIPASE, AMYLASE,  in the last 72 hours CBC:  Recent Labs  08/19/12 0430 08/20/12 0530  WBC 8.4 7.6  HGB 11.0* 12.1  HCT 33.9* 36.4  MCV 92.6 92.4  PLT 140* 164   Cardiac Enzymes:  Recent Labs  08/17/12 1604 08/17/12 2347 08/18/12 0555  TROPONINI <0.30 <0.30 <0.30   BNP: No components found with this basename: POCBNP,  D-Dimer: No results found for this basename: DDIMER,  in the last 72 hours Hemoglobin A1C: No results found for this basename: HGBA1C,  in  the last 72 hours Fasting Lipid Panel: No results found for this basename: CHOL, HDL, LDLCALC, TRIG, CHOLHDL, LDLDIRECT,  in the last 72 hours Thyroid Function Tests:  Recent Labs  08/17/12 1606  TSH 1.900   Anemia Panel: No results found for this basename: VITAMINB12, FOLATE, FERRITIN, TIBC, IRON, RETICCTPCT,  in the last 72 hours  RADIOLOGY: No results found.  PHYSICAL EXAM General: NAD Neck: JVP 8 cm, no thyromegaly or thyroid nodule.  Lungs: Clear bilaterally CV: Nondisplaced PMI.  Heart irregular S1/S2, no S3/S4, no murmur.  No peripheral edema.  Abdomen: Soft, nontender, no hepatosplenomegaly, no distention.  Neurologic: Alert and oriented x 3.  Psych: Normal affect. Extremities: No clubbing or cyanosis.   TELEMETRY: Reviewed telemetry pt in atrial fibrillation, rate 80s  ASSESSMENT AND PLAN: 79 yo with history of cardiomyopathy, paroxysmal atrial fibrillation, and CKD presented with shortness of breath and found to have CHF.  1. Acute on chronic systolic CHF: Echo with EF 35-40%, stable from 2008.  Volume status  looks better, creatinine up.  I/Os not very negative yesterday but weight is down significantly.  - Stop IV Lasix.  Can start once daily po Lasix this evening.  - Stopped losartan with AKI on CKD, using hydralazine/nitrates for afterload reduction instead.   2. Atrial fibrillation: Suspect acute CHF may be related to going back into atrial fibrillation.   - Stopped atenolol and using Toprol XL 50 mg bid given renal dysfunction.  Rate controlled currently.  - She is steady on her feet with no recent falls.  On lower dosed apixaban. 3. Renal: AKI on CKD.  Creatinine slightly up to 2.1 from 1.9.  Stop IV Lasix as above.   4. Needs rehab stay, will consult social work.   Melissa Robinson 08/20/2012 8:44 AM

## 2012-08-21 LAB — BASIC METABOLIC PANEL
CO2: 27 mEq/L (ref 19–32)
CO2: 26 mEq/L (ref 19–32)
Calcium: 9.7 mg/dL (ref 8.4–10.5)
Calcium: 9.8 mg/dL (ref 8.4–10.5)
Chloride: 94 mEq/L — ABNORMAL LOW (ref 96–112)
Creatinine, Ser: 2.39 mg/dL — ABNORMAL HIGH (ref 0.50–1.10)
Creatinine, Ser: 2.35 mg/dL — ABNORMAL HIGH (ref 0.50–1.10)
Glucose, Bld: 104 mg/dL — ABNORMAL HIGH (ref 70–99)
Glucose, Bld: 128 mg/dL — ABNORMAL HIGH (ref 70–99)
Sodium: 136 mEq/L (ref 135–145)

## 2012-08-21 LAB — CBC
HCT: 35.1 % — ABNORMAL LOW (ref 36.0–46.0)
Hemoglobin: 11.4 g/dL — ABNORMAL LOW (ref 12.0–15.0)
MCH: 29.9 pg (ref 26.0–34.0)
MCV: 92.1 fL (ref 78.0–100.0)
RBC: 3.81 MIL/uL — ABNORMAL LOW (ref 3.87–5.11)

## 2012-08-21 MED ORDER — APIXABAN 2.5 MG PO TABS
2.5000 mg | ORAL_TABLET | Freq: Two times a day (BID) | ORAL | Status: AC
  Administered 2012-08-21: 2.5 mg via ORAL

## 2012-08-21 MED ORDER — APIXABAN 2.5 MG PO TABS
2.5000 mg | ORAL_TABLET | Freq: Two times a day (BID) | ORAL | Status: DC
  Administered 2012-08-22 – 2012-08-23 (×2): 2.5 mg via ORAL
  Filled 2012-08-21 (×3): qty 1

## 2012-08-21 NOTE — Progress Notes (Signed)
Patient ID: Melissa Robinson, female   DOB: 06/05/1916, 79 y.o.   MRN: [id number] Chart reviewed. Continues to diuresis and weight continues to drop. BUN/creatinine are stable. Ventricular rate better controlled. No new recommendations today.

## 2012-08-21 NOTE — Progress Notes (Signed)
MEDICATION RELATED CONSULT NOTE - FOLLOW UP   Pharmacy Consult for Apixaban Indication: Afib  No Known Allergies  Patient Measurements: Height: 5\' 4"  (162.6 cm) Weight: 117 lb 11.6 oz (53.4 kg) IBW/kg (Calculated) : 54.7  Vital Signs: Temp: 99 F (37.2 C) (07/27 1340) Temp src: Oral (07/27 1340) BP: 111/67 mmHg (07/27 1340) Pulse Rate: 97 (07/27 1340) Intake/Output from previous day: 07/26 0701 - 07/27 0700 In: -  Out: 1501 [Urine:1500; Stool:1]  Labs:  Recent Labs  08/19/12 0430 08/20/12 0530 08/21/12 0710 08/21/12 0802  WBC 8.4 7.6 6.7  --   HGB 11.0* 12.1 11.4*  --   HCT 33.9* 36.4 35.1*  --   PLT 140* 164 185  --   CREATININE 1.96* 2.12* 2.39* 2.35*   Estimated Creatinine Clearance: 12.6 ml/min (by C-G formula based on Cr of 2.35).   Medications:  Prescriptions prior to admission  Medication Sig Dispense Refill  . amLODipine (NORVASC) 10 MG tablet Take 10 mg by mouth daily.      Marland Kitchen atenolol (TENORMIN) 100 MG tablet Take 100 mg by mouth daily.      Marland Kitchen CALCIUM-VITAMIN D PO Take 1 tablet by mouth 2 (two) times daily.      . cholecalciferol (VITAMIN D) 400 UNITS TABS Take 400 Units by mouth daily.      . cyanocobalamin 500 MCG tablet Take 500 mcg by mouth daily.      Marland Kitchen denosumab (PROLIA) 60 MG/ML SOLN injection Inject 60 mg into the skin every 6 (six) months. Administer in upper arm, thigh, or abdomen      . iron polysaccharides (NIFEREX) 150 MG capsule Take 150 mg by mouth daily.      Marland Kitchen losartan (COZAAR) 100 MG tablet Take 50 mg by mouth daily.      . mirabegron ER (MYRBETRIQ) 25 MG TB24 Take 25 mg by mouth daily.      . Multiple Vitamin (MULTIVITAMIN WITH MINERALS) TABS Take 1 tablet by mouth daily.      Marland Kitchen omeprazole (PRILOSEC) 40 MG capsule Tablet twice daily- 30 minutes before breakfast and supper  60 capsule  11  . traMADol (ULTRAM) 50 MG tablet Take 50 mg by mouth every 8 (eight) hours as needed for pain.       Assessment: 70 YOF with Afib and CHF  exacerbation. On apixaban, age > 80, wt < 60kg, SCr 1.5>>2.35, est. crcl < 15 ml/min now, scr elevation could caused by diuresis (was on lasix 60mg  IV Q8). UOP good 1.59ml/kg/hr now on lasix 40mg  po daily. Pt. Is also with esophageal stricture, plan to consult GI Monday morning for possible EGD, MD ordered to hold apixaban tonight and tomorrow morning and resume dose tomorrow night. Given pt. Old age, low body weight and worsening renal function, I am a little concerned about her bleeding risk, but since dose is held for now, we will continue monitor renal function change especially after lasix dose reduction.  CBC stable, no bleeding.  Goal of Therapy:  Therapeutic response without bleeding  Plan:  1.  Apixaban 2.5 mg twice daily 2.  Monitor for s/s of bleeding. 3.  F/u renal funcion change  Bayard Hugger, PharmD, BCPS  Clinical Pharmacist  Pager: [phone]  08/21/2012,1:52 PM

## 2012-08-21 NOTE — Progress Notes (Addendum)
Subjective: No events overnight Breathing better at rest  While her swallowing is better currently, she was starting to have recurrence of dysphagia prior to admission and the daughter would like this investigated further if stricture is present.   Continues to be severely deconditioned   Objective: Vital signs in last 24 hours: Temp:  [97.5 F (36.4 C)-97.8 F (36.6 C)] 97.8 F (36.6 C) (07/27 0521) Pulse Rate:  [83-97] 83 (07/27 0521) Resp:  [18] 18 (07/27 0521) BP: (95-124)/(56-73) 124/68 mmHg (07/27 0521) SpO2:  [94 %-98 %] 95 % (07/27 0521) Weight:  [117 lb 11.6 oz (53.4 kg)] 117 lb 11.6 oz (53.4 kg) (07/27 0521) Weight change: -7.6 oz (-0.215 kg) Last BM Date: 08/16/12  CBG (last 3)  No results found for this basename: GLUCAP,  in the last 72 hours  Intake/Output from previous day: 07/26 0701 - 07/27 0700 In: -  Out: 1501 [Urine:1500; Stool:1] Intake/Output this shift:    General appearance: WF in NAD  Eyes: no scleral icterus Throat: oropharynx moist without erythema Resp: crackles mild at bases B/L, no wheezes or rales otherwise, CTAB  Cardio: irregularly irregular rhythm, reg rate, no MRG  GI: soft, non-tender; bowel sounds normal; no masses,  no organomegaly Extremities: no clubbing, cyanosis or edema   Lab Results:  Recent Labs  08/19/12 0430 08/20/12 0530  NA 138 140  K 3.9 3.9  CL 100 98  CO2 27 27  GLUCOSE 115* 111*  BUN 41* 50*  CREATININE 1.96* 2.12*  CALCIUM 9.4 9.7   No results found for this basename: AST, ALT, ALKPHOS, BILITOT, PROT, ALBUMIN,  in the last 72 hours  Recent Labs  08/19/12 0430 08/20/12 0530  WBC 8.4 7.6  HGB 11.0* 12.1  HCT 33.9* 36.4  MCV 92.6 92.4  PLT 140* 164   Lab Results  Component Value Date   INR 1.11 01/28/2010   INR 1.09 12/19/2008   No results found for this basename: CKTOTAL, CKMB, CKMBINDEX, TROPONINI,  in the last 72 hours No results found for this basename: TSH, T4TOTAL, FREET3, T3FREE, THYROIDAB,   in the last 72 hours No results found for this basename: VITAMINB12, FOLATE, FERRITIN, TIBC, IRON, RETICCTPCT,  in the last 72 hours  Studies/Results: Dg Esophagus  08/20/2012   *RADIOLOGY REPORT*  Clinical Data:Dysphagia.  History of stricture status post dilatation.  ESOPHAGUS/BARIUM SWALLOW/TABLET STUDY  Fluoroscopy Time: 3 minutes and 47 seconds.  Comparison: No priors.  Findings: Initial double contrast images of the esophagus demonstrates some thickening of the esophageal folds, suggestive of esophagitis.  Multiple single swallow attempts demonstrated failure of any of the primary peristaltic waves to propagate fully. Additionally, multiple tertiary contractions were noted.  Full column barium esophagram demonstrated no esophageal mass or ring. Persistent narrowing of the distal esophagus at the level of the gastroesophageal junction was noted.  A barium tablet was administered which could not pass through this area of narrowing.  IMPRESSION: 1.  Distal esophageal stricture shortly above the gastroesophageal junction. 2.  Nonspecific esophageal motility disorder with strong tertiary contractions. 3.  Mild thickening of the esophageal mucosal folds suggestive of esophagitis.   Original Report Authenticated By: Trudie Reed, M.D.     Medications: Scheduled: . apixaban  2.5 mg Oral BID  . [START ON 08/22/2012] apixaban  2.5 mg Oral BID  . cholecalciferol  400 Units Oral Daily  . furosemide  40 mg Oral Daily  . hydrALAZINE  25 mg Oral Q8H  . isosorbide mononitrate  30 mg Oral  Daily  . metoprolol succinate  50 mg Oral BID  . multivitamin with minerals  1 tablet Oral Daily  . pantoprazole  40 mg Oral Daily  . sodium chloride  3 mL Intravenous Q12H  . sodium chloride  3 mL Intravenous Q12H   Continuous:   Assessment/Plan: Principal Problem: 1. Acute respiratory failure secondary to Acute on chronic systolic congestive heart failure (prior ejection fraction 35%)- Recs per cardiology.  Assistance appreciated. BUN/Cr pending today. Transitioned to lasix 40mg  PO daily from IV yesterday, continues to have stable weights and net neg I/Os.   2. Atrial fibrillation- rate controlled on metoprolol. On low dose apixaban 3.  Chronic renal insufficiency, stage IV (severe)-   ARB has been held. BUN/Cr pending today. Lasix changed from IV to PO yesterday.  4.   Dysphagia- On today's review, barium swallow yesterday showed esophageal stricture. Given not urgent, consult GI monday morning for possible EGD w/ dilation. Patient is currently on apixaban BID, I will have today's PM and tomorrow's AM dose held, in case GI dose want to proceed w/ procedure tomorrow. Will also make NPO after MN 5. ANEMIA, IRON DEFICIENCY/chronic diseae- stable.  Monitor. 6. Disposition-PT rec'd SNF. Family in agreement. Case management on board. D/C likely mid next week.  7. Ethics-discussed CODE STATUS and she clearly desires DO NOT RESUSCITATE. She would not be opposed to DC cardioversion for symptomatic management of her atrial fibrillation but does not want intubation, CPR, or defibrillation.   LOS: 4 days   Melissa Robinson 08/21/2012, 7:36 AM

## 2012-08-22 ENCOUNTER — Encounter (HOSPITAL_COMMUNITY)
Admission: AD | Disposition: A | Discharge status: Skilled Nursing Facility | Payer: Self-pay | Source: Ambulatory Visit | Attending: Internal Medicine

## 2012-08-22 ENCOUNTER — Encounter (HOSPITAL_COMMUNITY): Payer: Self-pay | Admitting: *Deleted

## 2012-08-22 DIAGNOSIS — R1310 Dysphagia, unspecified: Secondary | ICD-10-CM

## 2012-08-22 DIAGNOSIS — R933 Abnormal findings on diagnostic imaging of other parts of digestive tract: Secondary | ICD-10-CM

## 2012-08-22 DIAGNOSIS — K222 Esophageal obstruction: Secondary | ICD-10-CM

## 2012-08-22 HISTORY — PX: ESOPHAGOGASTRODUODENOSCOPY (EGD) WITH ESOPHAGEAL DILATION: SHX5812

## 2012-08-22 LAB — CBC
HCT: 35.6 % — ABNORMAL LOW (ref 36.0–46.0)
MCH: 31.1 pg (ref 26.0–34.0)
MCV: 91.5 fL (ref 78.0–100.0)
Platelets: 195 10*3/uL (ref 150–400)
RBC: 3.89 MIL/uL (ref 3.87–5.11)

## 2012-08-22 LAB — BASIC METABOLIC PANEL
BUN: 63 mg/dL — ABNORMAL HIGH (ref 6–23)
CO2: 29 mEq/L (ref 19–32)
Calcium: 10 mg/dL (ref 8.4–10.5)
Creatinine, Ser: 2.33 mg/dL — ABNORMAL HIGH (ref 0.50–1.10)

## 2012-08-22 LAB — PRO B NATRIURETIC PEPTIDE: Pro B Natriuretic peptide (BNP): 9449 pg/mL — ABNORMAL HIGH (ref 0–450)

## 2012-08-22 SURGERY — ESOPHAGOGASTRODUODENOSCOPY (EGD) WITH ESOPHAGEAL DILATION
Anesthesia: Moderate Sedation

## 2012-08-22 MED ORDER — SODIUM CHLORIDE 0.9 % IV SOLN: INTRAVENOUS | Status: DC

## 2012-08-22 MED ORDER — FENTANYL CITRATE 0.05 MG/ML IJ SOLN
INTRAMUSCULAR | Status: DC | PRN
  Administered 2012-08-22 (×2): 12.5 ug via INTRAVENOUS

## 2012-08-22 MED ORDER — FENTANYL CITRATE 0.05 MG/ML IJ SOLN
INTRAMUSCULAR | Status: AC
  Filled 2012-08-22: qty 2

## 2012-08-22 MED ORDER — MIDAZOLAM HCL 10 MG/2ML IJ SOLN
INTRAMUSCULAR | Status: DC | PRN
  Administered 2012-08-22 (×4): .5 mg via INTRAVENOUS

## 2012-08-22 MED ORDER — SODIUM CHLORIDE 0.9 % IV SOLN
Freq: Once | INTRAVENOUS | Status: AC
  Administered 2012-08-22: 500 mL via INTRAVENOUS

## 2012-08-22 MED ORDER — BUTAMBEN-TETRACAINE-BENZOCAINE 2-2-14 % EX AERO
INHALATION_SPRAY | CUTANEOUS | Status: DC | PRN
  Administered 2012-08-22: 1 via TOPICAL

## 2012-08-22 MED ORDER — MIDAZOLAM HCL 5 MG/ML IJ SOLN
INTRAMUSCULAR | Status: AC
  Filled 2012-08-22: qty 2

## 2012-08-22 NOTE — Progress Notes (Signed)
Patient ID: Melissa Robinson, female   DOB: 25-Dec-1916, 79 y.o.   MRN: [id number]    SUBJECTIVE: No dyspnea but weak.  Creatinine stable.    Marland Kitchen apixaban  2.5 mg Oral BID  . cholecalciferol  400 Units Oral Daily  . furosemide  40 mg Oral Daily  . hydrALAZINE  25 mg Oral Q8H  . isosorbide mononitrate  30 mg Oral Daily  . metoprolol succinate  50 mg Oral BID  . multivitamin with minerals  1 tablet Oral Daily  . pantoprazole  40 mg Oral Daily  . sodium chloride  3 mL Intravenous Q12H  . sodium chloride  3 mL Intravenous Q12H      Filed Vitals:   08/21/12 0521 08/21/12 1340 08/21/12 2130 08/22/12 0603  BP: 124/68 111/67 111/68 107/62  Pulse: 83 97 71 78  Temp: 97.8 F (36.6 C) 99 F (37.2 C) 98.3 F (36.8 C) 98.4 F (36.9 C)  TempSrc: Oral Oral Oral Oral  Resp: 18 18 18 18   Height:      Weight: 53.4 kg (117 lb 11.6 oz)   54.976 kg (121 lb 3.2 oz)  SpO2: 95% 96% 97% 96%    Intake/Output Summary (Last 24 hours) at 08/22/12 0818 Last data filed at 08/21/12 1900  Gross per 24 hour  Intake    360 ml  Output    450 ml  Net    -90 ml    LABS: Basic Metabolic Panel:  Recent Labs  40/98/11 0802 08/22/12 0555  NA 136 139  K 3.6 4.0  CL 94* 97  CO2 26 29  GLUCOSE 128* 104*  BUN 61* 63*  CREATININE 2.35* 2.33*  CALCIUM 9.8 10.0   Liver Function Tests: No results found for this basename: AST, ALT, ALKPHOS, BILITOT, PROT, ALBUMIN,  in the last 72 hours No results found for this basename: LIPASE, AMYLASE,  in the last 72 hours CBC:  Recent Labs  08/21/12 0710 08/22/12 0555  WBC 6.7 7.1  HGB 11.4* 12.1  HCT 35.1* 35.6*  MCV 92.1 91.5  PLT 185 195   Cardiac Enzymes: No results found for this basename: CKTOTAL, CKMB, CKMBINDEX, TROPONINI,  in the last 72 hours BNP: No components found with this basename: POCBNP,  D-Dimer: No results found for this basename: DDIMER,  in the last 72 hours Hemoglobin A1C: No results found for this basename: HGBA1C,  in the last 72  hours Fasting Lipid Panel: No results found for this basename: CHOL, HDL, LDLCALC, TRIG, CHOLHDL, LDLDIRECT,  in the last 72 hours Thyroid Function Tests: No results found for this basename: TSH, T4TOTAL, FREET3, T3FREE, THYROIDAB,  in the last 72 hours Anemia Panel: No results found for this basename: VITAMINB12, FOLATE, FERRITIN, TIBC, IRON, RETICCTPCT,  in the last 72 hours  RADIOLOGY: No results found.  PHYSICAL EXAM General: NAD Neck: JVP 8 cm, no thyromegaly or thyroid nodule.  Lungs: Clear bilaterally CV: Nondisplaced PMI.  Heart irregular S1/S2, no S3/S4, no murmur.  No peripheral edema.  Abdomen: Soft, nontender, no hepatosplenomegaly, no distention.  Neurologic: Alert and oriented x 3.  Psych: Normal affect. Extremities: No clubbing or cyanosis.   TELEMETRY: Reviewed telemetry pt in atrial fibrillation, rate 80s  ASSESSMENT AND PLAN: 79 yo with history of cardiomyopathy, paroxysmal atrial fibrillation, and CKD presented with shortness of breath and found to have CHF.  1. Acute on chronic systolic CHF: Echo with EF 35-40%, stable from 2008.  Volume status looks better, creatinine up from baseline. -  Continue po Lasix.  - Stopped losartan with AKI on CKD, using hydralazine/nitrates for afterload reduction instead.   2. Atrial fibrillation: Suspect acute CHF may be related to going back into atrial fibrillation.   - Stopped atenolol and using Toprol XL 50 mg bid given renal dysfunction.  Rate controlled currently.  - She is steady on her feet with no recent falls.  On lower dosed apixaban. 3. Renal: AKI on CKD. Creatinine stable but above baseline.  Continue to follow.  4. Needs rehab stay, will consult social work.  5. Dysphagia: GI to be involved per primary service.   [name] 08/22/2012 8:18 AM

## 2012-08-22 NOTE — Progress Notes (Addendum)
Clinical Social Work Department CLINICAL SOCIAL WORK PLACEMENT NOTE 08/22/2012  Patient:  Melissa Robinson, Melissa Robinson  Account Number:  [id number] Admit date:  08/17/2012  Clinical Social Worker:  Theresia Bough, Theresia Majors  Date/time:  08/22/2012 02:29 PM  Clinical Social Work is seeking post-discharge placement for this patient at the following level of care:   SKILLED NURSING   (*CSW will update this form in Epic as items are completed)   08/22/2012  Patient/family provided with [facility] Department of Clinical Social Work's list of facilities offering this level of care within the geographic area requested by the patient (or if unable, by the patient's family).  08/22/2012  Patient/family informed of their freedom to choose among providers that offer the needed level of care, that participate in Medicare, Medicaid or managed care program needed by the patient, have an available bed and are willing to accept the patient.  08/22/2012  Patient/family informed of [facility]' ownership interest in [facility], as well as of the fact that they are under no obligation to receive care at this facility.  PASARR submitted to EDS on 08/22/2012 PASARR number received from EDS on [id number]  FL2 transmitted to all facilities in geographic area requested by pt/family on  08/22/2012 FL2 transmitted to all facilities within larger geographic area on   Patient informed that his/her managed care company has contracts with or will negotiate with  certain facilities, including the following:     Patient/family informed of bed offers received: 08/22/12  Patient chooses bed at [facility] Physician recommends and patient chooses bed at    Patient to be transferred to [facility] on 08/23/2012  Patient to be transferred to facility by daughter-in-law  The following physician request were entered in Epic:   Additional Comments: Pt requesting [facility].  Theresia Bough, MSW, Theresia Majors [phone]

## 2012-08-22 NOTE — Progress Notes (Signed)
Clinical Child psychotherapist (CSW) informed pt daughter that [facility] is able to offer placement for pt tomorrow. Daughter very appreciative and plans to be at the hospital tomorrow at 9 am when facility rep arrives to complete paperwork with pt. CSW to assist with dc when pt is medically ready.   MD, please sign FL2 in pt shadow chart.   Theresia Bough, MSW, Theresia Majors [phone]

## 2012-08-22 NOTE — Progress Notes (Signed)
Clinical Social Work Department BRIEF PSYCHOSOCIAL ASSESSMENT 08/22/2012  Patient:  DELISE, SIMENSON     Account Number:  [id number]     Admit date:  08/17/2012  Clinical Social Worker:  Lourdes Sledge  Date/Time:  08/22/2012 02:32 PM  Referred by:  Physician  Date Referred:  08/22/2012 Referred for  SNF Placement   Other Referral:   Interview type:  Patient Other interview type:   CSW also completed assessment with daughter [name] [phone] by phone and pt daughter in law Edman Circle daughter Baxter Hire in pt room.    PSYCHOSOCIAL DATA Living Status:  ALONE Admitted from facility:   Level of care:   Primary support name:  [name] [phone] Primary support relationship to patient:  CHILD, ADULT Degree of support available:   Pt daughter actively involved in pt care.    CURRENT CONCERNS Current Concerns  Post-Acute Placement   Other Concerns:    SOCIAL WORK ASSESSMENT / PLAN CSW informed that pt requesting SNF placement at discharge.    CSW visited pt room and introduced herself and role to pt and family (Donna-dtr in law/Kristen- grand daughter.) CSW informed pt of PT recommendations and pt stated she would like placement at [facility]. CSW informed pt of  need to consider a back-up due to [facility] being a highly requested facility and due to them having a waiting list on private rooms. Pt stated she would like a private room whereever she goes however would consider a semi private at [facility] Pt also considering placement at [facility].    CSW received consent to do a SNF search as well as to contact pt daughter Jodene Nam. CSW contacted Darl Pikes and confirmed that family and pt requesting placement at [facility] or [facility].   Assessment/plan status:  Psychosocial Support/Ongoing Assessment of Needs Other assessment/ plan:   Information/referral to community resources:   CSW provided pt with a SNF list and CSW contact information.    PATIENT'S/FAMILY'S  RESPONSE TO PLAN OF CARE: Pt laying in bed, presented and oriented. Pt was very pleasant to speak to and appreciative of CSW assistance.    CSW to follow up with bed offers.       Theresia Bough, MSW, Theresia Majors [phone]

## 2012-08-22 NOTE — Op Note (Signed)
[name] [address] [place], [address]   ENDOSCOPY PROCEDURE REPORT  PATIENT: Melissa Robinson, Melissa Robinson  MR#: [id number] BIRTHDATE: 1916-09-25 , 89  yrs. old GENDER: Female ENDOSCOPIST: Roxy Cedar, MD REFERRED BY:  [name], M.D. PROCEDURE DATE:  08/22/2012 PROCEDURE:  EGD, diagnostic and Maloney dilation of esophagus  - 70F ASA CLASS:     Class III INDICATIONS:  Dysphagia.   abnormal  esophagogram. MEDICATIONS: Fentanyl 25 mcg IV and Versed 2 mg IV TOPICAL ANESTHETIC: Cetacaine Spray  DESCRIPTION OF PROCEDURE: After the risks benefits and alternatives of the procedure were thoroughly explained, informed consent was obtained.  The Pentax Gastroscope [id number] endoscope was introduced through the mouth and advanced to the second portion of the duodenum. Without limitations.  The instrument was slowly withdrawn as the mucosa was fully examined.   EXAM: The upper, middle and distal third of the esophagus were carefully inspected and no abnormalities were noted.  The z-line was well seen at the GEJ. There was a focal large caliber ring-like stricture at the GEJ (36cm)  The endoscope was pushed into the fundus which was normal including a retroflexed view.  The antrum, gastric body, first and second part of the duodenum were unremarkable.  Retroflexed views revealed no abnormalities.     The scope was then withdrawn from the patient and the procedure completed.  THERAPY: 56 F MALONEY DILATOR PASSED WITHOUT HEME OR RESISTANCE. TOLERATED WELL  COMPLICATIONS: There were no complications.  ENDOSCOPIC IMPRESSION: 1. Distal stricture s/p dilation 2. Esophageal dysmotility  RECOMMENDATIONS: 1. Continue PPI and resume diet 2. OK to anticoagulate from GI perspective. 3. Call if further assistance needed. Thank you  REPEAT EXAM:  eSigned:  Roxy Cedar, MD 08/22/2012 11:31 AM   CC:W.  Buren Kos, MD

## 2012-08-22 NOTE — Consult Note (Signed)
[facility] Consult: 8:34 AM 08/22/2012   Referring Provider: Buren Kos md  Primary Care Physician:  [name], MD Primary Gastroenterologist:  Dr. Yancey Flemings md   Reason for Consultation:  Esophageal stricture  HPI: Melissa Robinson is a 79 y.o. female.  Hx a fib, chf.  Hx colon cancer, peptic stricture requiring dilation in past, a fib.  Seen by Dr [name] for food impaction and progressive dysphagia on 4/26.  Set up for esophagram today.  EGD 05/21/12 with duodenal ulcer, a distal esophageal stricture was dilated.  Discharged on BID omeprazole 40 mg.  Stool urease was negative.  Still c/o dysphagia.  Set up for esophagram today.  Admitted 7/23 with rapid a fib and CHF.   Eliquis initiated but held for last 2 days. Volume status, resp status improved.  Esophagram done and shows dysmotility and distal esophageal stricture.  Dr Clelia Croft wondering about possible EGD/dilatation today.  She is set to discharge to SNF tomorrow.     Past Medical History  Diagnosis Date  . Hypertension   . Hyperlipemia   . Neuropathy   . Atrial fibrillation     a. Noted during 04/2012 hospitalization.  . Colon cancer     a. Stage III colon CA s/p R lap-assisted colectomy 05/2006.  Marland Kitchen Anemia     Previously reported as IDA in setting of colon CA, then anemia of chronic disease.  . CKD (chronic kidney disease)     a. Stage III.  Marland Kitchen Osteoporosis     a. T10/11/12 compression fracture s/p kyphoplasty (12/10). b. R hip fracture s/p ORIF (1/12)-->Prolia 12/12.  . Peptic stricture of esophagus     a. Recurrent, s/p previous diltiation. b. Food impaction 04/2012 - had dilitation 05/2012, but dysphagia recurred 07/2012.  Marland Kitchen GERD (gastroesophageal reflux disease)   . Hip fracture, right     a. 2012 from a fall.  . CHF (congestive heart failure)     a. Echo 2008 in setting of acute illness: EF 35-40%, mod LVH, mild AI, moderate MR, LA mildly dilated, PA pressure .  .  Mitral regurgitation     a. Mod by echo 2008.  Marland Kitchen Aortic insufficiency     a. Mild by echo 2008.  Marland Kitchen Urinary incontinence   . Gout   . Arthritis   . Sleep apnea   . Gait instability   . Renal cyst, left   . LBBB (left bundle branch block)     Past Surgical History  Procedure Laterality Date  . Esophagogastroduodenoscopy    . Colonosopy    . Hip fracture surgery    . Esophagogastroduodenoscopy N/A 05/21/2012    Procedure: ESOPHAGOGASTRODUODENOSCOPY (EGD);  Surgeon: Hilarie Fredrickson, MD;  Location: [facility] ENDOSCOPY;  Service: Endoscopy;  Laterality: N/A;    Prior to Admission medications   Medication Sig Start Date End Date Taking? Authorizing Provider  amLODipine (NORVASC) 10 MG tablet Take 10 mg by mouth daily.   Yes Historical Provider, MD  atenolol (TENORMIN) 100 MG tablet Take 100 mg by mouth daily.   Yes Historical Provider, MD  CALCIUM-VITAMIN D PO Take 1 tablet by mouth 2 (two) times daily.   Yes Historical Provider, MD  cholecalciferol (VITAMIN D) 400 UNITS TABS Take 400 Units by mouth daily.   Yes Historical Provider, MD  cyanocobalamin 500 MCG tablet Take 500 mcg by mouth daily.   Yes Historical Provider, MD  denosumab (PROLIA) 60 MG/ML SOLN injection Inject 60 mg into the skin every 6 (six) months.  Administer in upper arm, thigh, or abdomen   Yes Historical Provider, MD  iron polysaccharides (NIFEREX) 150 MG capsule Take 150 mg by mouth daily.   Yes Historical Provider, MD  losartan (COZAAR) 100 MG tablet Take 50 mg by mouth daily.   Yes Historical Provider, MD  mirabegron ER (MYRBETRIQ) 25 MG TB24 Take 25 mg by mouth daily.   Yes Historical Provider, MD  Multiple Vitamin (MULTIVITAMIN WITH MINERALS) TABS Take 1 tablet by mouth daily.   Yes Historical Provider, MD  omeprazole (PRILOSEC) 40 MG capsule Tablet twice daily- 30 minutes before breakfast and supper 05/27/12  Yes Hilarie Fredrickson, MD  traMADol (ULTRAM) 50 MG tablet Take 50 mg by mouth every 8 (eight) hours as needed for pain.    Yes Historical Provider, MD    Scheduled Meds: . apixaban  2.5 mg Oral BID  . cholecalciferol  400 Units Oral Daily  . furosemide  40 mg Oral Daily  . hydrALAZINE  25 mg Oral Q8H  . isosorbide mononitrate  30 mg Oral Daily  . metoprolol succinate  50 mg Oral BID  . multivitamin with minerals  1 tablet Oral Daily  . pantoprazole  40 mg Oral Daily  . sodium chloride  3 mL Intravenous Q12H  . sodium chloride  3 mL Intravenous Q12H   Infusions:   PRN Meds: sodium chloride, acetaminophen, acetaminophen, albuterol, ALPRAZolam, HYDROcodone-acetaminophen, ondansetron (ZOFRAN) IV, ondansetron, polyethylene glycol, sodium chloride   Allergies as of 08/17/2012  . (No Known Allergies)    Family History  Problem Relation Age of Onset  . Heart disease      Mother died of heart disease later in life  . Breast cancer    . Cancer      Head/neck cancer    History   Social History  . Marital Status: Widowed    Spouse Name: N/A    Number of Children: N/A  . Years of Education: N/A   Occupational History  . Not on file.   Social History Main Topics  . Smoking status: Never Smoker   . Smokeless tobacco: Not on file  . Alcohol Use: No  . Drug Use: No  . Sexually Active: Not on file   Other Topics Concern  . Not on file   Social History Narrative  . No narrative on file    REVIEW OF SYSTEMS: Constitutional:  No weight loss ENT:  No nose bleeds Pulm:  resp status improved.  Still a bit hoarse CV:  No chest pain.  No foot swelling GU:  No blood in urine GI:  Per HPI Heme:  On Iron PTA.    Transfusions:  None this admission Neuro:  No headache, no syncope.  Balance is challenging, walks with a cane Derm:  No rash or sores Endocrine:  No sweats Immunization:  Not known Travel:  none   PHYSICAL EXAM: Vital signs in last 24 hours: Temp:  [98.3 F (36.8 C)-99 F (37.2 C)] 98.4 F (36.9 C) (07/28 0603) Pulse Rate:  [71-97] 78 (07/28 0603) Resp:  [18] 18 (07/28  0603) BP: (107-111)/(62-68) 107/62 mmHg (07/28 0603) SpO2:  [96 %-97 %] 96 % (07/28 0603) Weight:  [54.976 kg (121 lb 3.2 oz)] 54.976 kg (121 lb 3.2 oz) (07/28 0603)  General: pleasant, looks well Head:  No asymmetry or swelling  Eyes:  No icterus or pallor  Ears:  Minor HOH  Nose:  No discharge or congestion Mouth:  Slightly dry tongue some reddish discoloration on  tongue Neck:  No mass or JVD Lungs:  Crackles in right base Heart: Irregular, rate controlled.  No MRG Abdomen:  Soft, ND, NT.  No HSm.  No mass.  Active BS.   Rectal: not done   Musc/Skeltl: kyphosis Extremities:  No  Pedal edema  Neurologic:  Pleasant, oriented x 3.  Very alert Skin:  No sores or rash Tattoos:  none Nodes:  No cervical adenopathy   Psych:  Pleasant, relaxed   Intake/Output from previous day: 07/27 0701 - 07/28 0700 In: 360 [P.O.:360] Out: 450 [Urine:450] Intake/Output this shift: Total I/O In: 0  Out: 150 [Urine:150]  LAB RESULTS:  Recent Labs  08/20/12 0530 08/21/12 0710 08/22/12 0555  WBC 7.6 6.7 7.1  HGB 12.1 11.4* 12.1  HCT 36.4 35.1* 35.6*  PLT 164 185 195   BMET Lab Results  Component Value Date   NA 139 08/22/2012   NA 136 08/21/2012   NA 136 08/21/2012   K 4.0 08/22/2012   K 3.6 08/21/2012   K 3.6 08/21/2012   CL 97 08/22/2012   CL 94* 08/21/2012   CL 94* 08/21/2012   CO2 29 08/22/2012   CO2 26 08/21/2012   CO2 27 08/21/2012   GLUCOSE 104* 08/22/2012   GLUCOSE 128* 08/21/2012   GLUCOSE 104* 08/21/2012   BUN 63* 08/22/2012   BUN 61* 08/21/2012   BUN 62* 08/21/2012   CREATININE 2.33* 08/22/2012   CREATININE 2.35* 08/21/2012   CREATININE 2.39* 08/21/2012   CALCIUM 10.0 08/22/2012   CALCIUM 9.8 08/21/2012   CALCIUM 9.7 08/21/2012   LFT No results found for this basename: PROT, ALBUMIN, AST, ALT, ALKPHOS, BILITOT, BILIDIR, IBILI,  in the last 72 hours PT/INR Lab Results  Component Value Date   INR 1.11 01/28/2010   INR 1.09 12/19/2008   Hepatitis Panel No results found for this  basename: HEPBSAG, HCVAB, HEPAIGM, HEPBIGM,  in the last 72 hours C-Diff No components found with this basename: cdiff    Drugs of Abuse  No results found for this basename: labopia, cocainscrnur, labbenz, amphetmu, thcu, labbarb     RADIOLOGY STUDIES: Dg Esophagus 08/20/2012  .  Findings: Initial double contrast images of the esophagus demonstrates some thickening of the esophageal folds, suggestive of esophagitis.  Multiple single swallow attempts demonstrated failure of any of the primary peristaltic waves to propagate fully. Additionally, multiple tertiary contractions were noted.  Full column barium esophagram demonstrated no esophageal mass or ring. Persistent narrowing of the distal esophagus at the level of the gastroesophageal junction was noted.  A barium tablet was administered which could not pass through this area of narrowing.  IMPRESSION: 1.  Distal esophageal stricture shortly above the gastroesophageal junction. 2.  Nonspecific esophageal motility disorder with strong tertiary contractions. 3.  Mild thickening of the esophageal mucosal folds suggestive of esophagitis.   Original Report Authenticated By: Trudie Reed, M.D.    ENDOSCOPIC STUDIES: EGD  05/21/12 ENDOSCOPIC IMPRESSION:  1. Esophageal food impaction status post endoscopic removal  2. Underlying dysmotility and distal esophageal stricture.  RECOMMENDATIONS:  1. Clear liquids for 24 hours then full liquids. No meats or  breads.pure food  2. Prilosec OTC 20 mg daily  3. Contact my office, as for my nurse Bonita Quin, regarding outpatient  endoscopy with dilation.  Colonoscopy 2009 Findings  NORMAL EXAM: Anastamosis Site to Rectum. Comments: S/P RIGHT  HEMICOLECTOMY.  Assessment  Comments:  NORMAL POST OPERATIVE EXAMINATION  Events   EGD 2009 Findings  HIATAL HERNIA:  STRICTURE /  STENOSIS: Stricture in Distal Esophagus. Constriction:  partial. Etiology: benign due to reflux. 37 cm from mouth. Lumen   diameter is 15 mm. ICD9: Esophageal Stricture: 530.3.  - Dilation: Distal Esophagus. Maloney dilator used, Diameter: 50 F,  No Resistance, No Heme present on extraction. 1 total dilators used.  Patient tolerance excellent.  - Normal: Fundus to Duodenal 2nd Portion.   02/2006 egd with esophageal dilatation of stricture   IMPRESSION: *  Dysphagia.  Hx of peptic stricture last dilated on 05/21/12.   esophageal stricture and esophageal dysmotility.  ? Role of repeat EGD with re dilation? *  Rapid a fib, now rate controlled.  Eliquis on hold.  *  Remote hx colon cancer.  Clean colonoscopy in 2009 *  Acute on chronic CHF.  EF 35 - 40%.  Volume status improved.  A fib exacerbated CHF *  Acute on chronic kidney dz.   PLAN: *  EGD with dilatation today.  Pt agreeable.    LOS: 5 days   Jennye Moccasin  08/22/2012, 8:34 AM Pager: [phone]  GI ATTENDING  Patient seen and examined. X-rays and labs reviewed. Agree with above H&P. Dysphagia a combination of dysmotility and distal stricture (dilated in May). Recent esophagram with impaired tablet passage at stricture. Will attempt repeat dilation in hopes of helping dysphagia.The nature of the procedure, as well as the risks, benefits, and alternatives were carefully and thoroughly reviewed with the patient. Ample time for discussion and questions allowed. The patient understood, was satisfied, and agreed to proceed.  [name]. Eda Keys., M.D. [facility] Division of Gastroenterology

## 2012-08-22 NOTE — Progress Notes (Signed)
Physical Therapy Treatment Patient Details Name: Melissa Robinson MRN: [id number] DOB: November 26, 1916 Today's Date: 08/22/2012 Time: 0865-7846 PT Time Calculation (min): 25 min  PT Assessment / Plan / Recommendation  History of Present Illness 79 yo with history of cardiomyopathy, paroxysmal atrial fibrillation, and CKD presented with shortness of breath and found to have CHF   Clinical Impression Pt making steady progress.   PT Comments      Follow Up Recommendations  SNF     Does the patient have the potential to tolerate intense rehabilitation     Barriers to Discharge        Equipment Recommendations  None recommended by PT    Recommendations for Other Services    Frequency Min 3X/week   Progress towards PT Goals Progress towards PT goals: Progressing toward goals  Plan Current plan remains appropriate    Precautions / Restrictions Precautions Precautions: Fall   Pertinent Vitals/Pain No c/o's    Mobility  Bed Mobility Supine to Sit: 4: Min assist;HOB elevated Sit to Supine: 5: Supervision;HOB flat Details for Bed Mobility Assistance: Assist to bring trunk up Transfers Sit to Stand: 4: Min assist;With upper extremity assist;From bed Stand to Sit: 4: Min guard;With upper extremity assist;To bed Details for Transfer Assistance: Assist to bring hips up and for balance. Ambulation/Gait Ambulation/Gait Assistance: 4: Min assist Ambulation Distance (Feet): 75 Feet (x 2) Assistive device: Rolling walker Ambulation/Gait Assistance Details: Verbal cues to stay closer to walker Gait Pattern: Step-through pattern;Decreased stride length;Trunk flexed Gait velocity: decreased    Exercises General Exercises - Lower Extremity Long Arc Quad: AROM;Both;10 reps;Seated Hip Flexion/Marching: AROM;Both;10 reps;Seated   PT Diagnosis:    PT Problem List:   PT Treatment Interventions:     PT Goals (current goals can now be found in the care plan section)    Visit Information  Last PT Received On: 08/22/12 Assistance Needed: +1 History of Present Illness: 79 yo with history of cardiomyopathy, paroxysmal atrial fibrillation, and CKD presented with shortness of breath and found to have CHF    Subjective Data      Cognition  Cognition Arousal/Alertness: Awake/alert Behavior During Therapy: WFL for tasks assessed/performed Overall Cognitive Status: Within Functional Limits for tasks assessed Memory: Decreased short-term memory    Balance     End of Session PT - End of Session Equipment Utilized During Treatment: Gait belt Activity Tolerance: Patient limited by fatigue Patient left: in bed;with call bell/phone within reach;with family/visitor present;with bed alarm set   GP     [facility] 08/22/2012, 3:35 PM  [facility] PT [phone]

## 2012-08-22 NOTE — Progress Notes (Signed)
Subjective: Feeling better with decreased shortness of breath with ambulation.  Remains weak.   Objective: Vital signs in last 24 hours: Temp:  [98.3 F (36.8 C)-99 F (37.2 C)] 98.4 F (36.9 C) (07/28 0603) Pulse Rate:  [71-97] 78 (07/28 0603) Resp:  [18] 18 (07/28 0603) BP: (107-111)/(62-68) 107/62 mmHg (07/28 0603) SpO2:  [96 %-97 %] 96 % (07/28 0603) Weight:  [54.976 kg (121 lb 3.2 oz)] 54.976 kg (121 lb 3.2 oz) (07/28 0603) Weight change: 1.576 kg (3 lb 7.6 oz) Last BM Date: 08/21/12  CBG (last 3)  No results found for this basename: GLUCAP,  in the last 72 hours  Intake/Output from previous day: 07/27 0701 - 07/28 0700 In: 360 [P.O.:360] Out: 450 [Urine:450] Intake/Output this shift:    General appearance: alert and no distress Eyes: no scleral icterus Throat: oropharynx moist without erythema Resp: bibasilar crackles Cardio: irregularly irregular rhythm GI: soft, non-tender; bowel sounds normal; no masses,  no organomegaly Extremities: no clubbing, cyanosis or edema   Lab Results:  Recent Labs  08/21/12 0802 08/22/12 0555  NA 136 139  K 3.6 4.0  CL 94* 97  CO2 26 29  GLUCOSE 128* 104*  BUN 61* 63*  CREATININE 2.35* 2.33*  CALCIUM 9.8 10.0   No results found for this basename: AST, ALT, ALKPHOS, BILITOT, PROT, ALBUMIN,  in the last 72 hours  Recent Labs  08/21/12 0710 08/22/12 0555  WBC 6.7 7.1  HGB 11.4* 12.1  HCT 35.1* 35.6*  MCV 92.1 91.5  PLT 185 195   Lab Results  Component Value Date   INR 1.11 01/28/2010   INR 1.09 12/19/2008    Studies/Results: Dg Esophagus  08/20/2012   *RADIOLOGY REPORT*  Clinical Data:Dysphagia.  History of stricture status post dilatation.  ESOPHAGUS/BARIUM SWALLOW/TABLET STUDY  Fluoroscopy Time: 3 minutes and 47 seconds.  Comparison: No priors.  Findings: Initial double contrast images of the esophagus demonstrates some thickening of the esophageal folds, suggestive of esophagitis.  Multiple single swallow  attempts demonstrated failure of any of the primary peristaltic waves to propagate fully. Additionally, multiple tertiary contractions were noted.  Full column barium esophagram demonstrated no esophageal mass or ring. Persistent narrowing of the distal esophagus at the level of the gastroesophageal junction was noted.  A barium tablet was administered which could not pass through this area of narrowing.  IMPRESSION: 1.  Distal esophageal stricture shortly above the gastroesophageal junction. 2.  Nonspecific esophageal motility disorder with strong tertiary contractions. 3.  Mild thickening of the esophageal mucosal folds suggestive of esophagitis.   Original Report Authenticated By: Trudie Reed, M.D.     Medications: Scheduled: . apixaban  2.5 mg Oral BID  . cholecalciferol  400 Units Oral Daily  . furosemide  40 mg Oral Daily  . hydrALAZINE  25 mg Oral Q8H  . isosorbide mononitrate  30 mg Oral Daily  . metoprolol succinate  50 mg Oral BID  . multivitamin with minerals  1 tablet Oral Daily  . pantoprazole  40 mg Oral Daily  . sodium chloride  3 mL Intravenous Q12H  . sodium chloride  3 mL Intravenous Q12H   Continuous:   Assessment/Plan: Principal Problem: 1. Acute respiratory failure secondary to acute on chronic systolic congestive heart failure- improved with diuresis.  Monitor weights, I/Os.  Appears euvolemic though weight increased slightly over past 24 hours. ARB on hold due to ARF.  Continue Bblocker, oral Lasix.  Active Problems: 2. Atrial fibrillation-rate controlled.  Resume Apixiban pending GI  evaluation. 3. Acute on Chronic renal insufficiency, stage IV (severe)-relatively stable with diuresis. 4. Dysphagia- barium esophagram confirms esophageal stricture.  Her symptoms have stabilized over past week.  Will discuss with GI to consider inpatient EGD with dilation vs. continued monitoring.  NPO and eliquis on hold in the event they prefer to proceed while inpatient.  5.  ANEMIA, IRON DEFICIENCY-Hg stable 6. Disposition- anticipate discharge to SNF rehab tomorrow pending GI evaluation if stable.  Prefers [facility] since she has been there previously.     LOS: 5 days   Melissa Robinson,W DOUGLAS 08/22/2012, 8:01 AM

## 2012-08-22 NOTE — Evaluation (Signed)
Occupational Therapy Evaluation Patient Details Name: Melissa Robinson MRN: [id number] DOB: [birth year] Today's Date: 08/22/2012 Time: 0865-7846 OT Time Calculation (min): 19 min  OT Assessment / Plan / Recommendation History of present illness 79 yo with history of cardiomyopathy, paroxysmal atrial fibrillation, and CKD presented with shortness of breath and found to have CHF   Clinical Impression   Pt presents with deficits listed below that limit her from going home alone.  Pt would benefit from cont OT to increase balance and safety while doing adls on her feet.  Feel a short stay at SNF may make her safer to return home.  In long run, pt may want to consider some sort of Iliving vs ALF set up due to balance issues, heart issues and advanced age since she does live alone.    OT Assessment  Patient needs continued OT Services    Follow Up Recommendations  SNF;Other (comment) (Pt prefers [facility])    Barriers to Discharge Decreased caregiver support pt lives alone.  Has support of her children but none of them live with her.  At some point, pt may want to consider ALF due to unsteadiness on feet, advanced age etc.  Equipment Recommendations  None recommended by OT    Recommendations for Other Services    Frequency  Min 2X/week    Precautions / Restrictions Precautions Precautions: Fall Restrictions Weight Bearing Restrictions: No   Pertinent Vitals/Pain Pt with no c/o pain.  O2 sat 97% on RA.  HR 107    ADL  Eating/Feeding: Performed;Independent Where Assessed - Eating/Feeding: Chair Grooming: Performed;Wash/dry hands;Wash/dry face;Supervision/safety Where Assessed - Grooming: Supported standing Upper Body Bathing: Simulated;Set up Where Assessed - Upper Body Bathing: Supported sitting Lower Body Bathing: Performed;Min guard Where Assessed - Lower Body Bathing: Supported sit to stand Upper Body Dressing: Performed;Set up Where Assessed - Upper Body Dressing: Supported  sitting Lower Body Dressing: Performed;Min guard Where Assessed - Lower Body Dressing: Supported sit to Pharmacist, hospital: Performed;Minimal Dentist Method: Other (comment) (ambulated to BR) Acupuncturist: Comfort height toilet Toileting - Clothing Manipulation and Hygiene: Performed;Min guard Where Assessed - Toileting Clothing Manipulation and Hygiene: Standing Transfers/Ambulation Related to ADLs: Pt walked in hallway and in room for adls with HHA and required min assist at times to maintain balance. ADL Comments: Pt does well with adls when sitting but does need min guard to min assist when standing due to decreased balance.    OT Diagnosis: Generalized weakness  OT Problem List: Decreased strength;Decreased activity tolerance OT Treatment Interventions: Self-care/ADL training;Therapeutic activities;DME and/or AE instruction   OT Goals(Current goals can be found in the care plan section) Acute Rehab OT Goals Patient Stated Goal: to be back in my house again. OT Goal Formulation: With patient Time For Goal Achievement: 09/05/12 Potential to Achieve Goals: Good ADL Goals Pt Will Perform Grooming: with supervision;standing Pt Will Perform Lower Body Bathing: with supervision;sit to/from stand Pt Will Perform Lower Body Dressing: with supervision;sit to/from stand Pt Will Perform Tub/Shower Transfer: with supervision;ambulating Additional ADL Goal #1: Pt will complete all toileting tasks with S  Visit Information  Last OT Received On: 08/22/12 Assistance Needed: +1 History of Present Illness: 79 yo with history of cardiomyopathy, paroxysmal atrial fibrillation, and CKD presented with shortness of breath and found to have CHF       Prior Functioning     Home Living Family/patient expects to be discharged to:: Private residence Living Arrangements: Alone Available Help at Discharge: Family;Available  PRN/intermittently Type of Home:  House Home Access: Stairs to enter [employer] of Steps: 2 Entrance Stairs-Rails: Right Home Layout: One level Home Equipment: Environmental consultant - 2 wheels Prior Function Level of Independence: Independent Comments: only uses RW for community Communication Communication: No difficulties Dominant Hand: Right         Vision/Perception Vision - History Baseline Vision: Wears glasses all the time Patient Visual Report: No change from baseline Vision - Assessment Vision Assessment: Vision not tested   Cognition  Cognition Arousal/Alertness: Awake/alert Behavior During Therapy: WFL for tasks assessed/performed Overall Cognitive Status: Within Functional Limits for tasks assessed Memory: Decreased short-term memory    Extremity/Trunk Assessment Upper Extremity Assessment Upper Extremity Assessment: Overall WFL for tasks assessed Lower Extremity Assessment Lower Extremity Assessment: Defer to PT evaluation Cervical / Trunk Assessment Cervical / Trunk Assessment: Kyphotic     Mobility Bed Mobility Bed Mobility: Not assessed (pt in chair on arrival.) Transfers Transfers: Sit to Stand;Stand to Sit Sit to Stand: 4: Min guard;With upper extremity assist;From bed Stand to Sit: 4: Min guard;With upper extremity assist;To bed Details for Transfer Assistance: Pt mildly unsteady on feet during transfers.  Does use walker in community but not in house.     Exercise     Balance Balance Balance Assessed: Yes   End of Session OT - End of Session Activity Tolerance: Patient limited by fatigue Patient left: in chair;Other (comment) (leaving for endoscopy) Nurse Communication: Mobility status  GO     Lauris, Keepers 08/22/2012, 10:08 AM [phone]

## 2012-08-23 ENCOUNTER — Encounter (HOSPITAL_COMMUNITY): Payer: Self-pay | Admitting: Internal Medicine

## 2012-08-23 LAB — CBC
HCT: 33.4 % — ABNORMAL LOW (ref 36.0–46.0)
Hemoglobin: 11.1 g/dL — ABNORMAL LOW (ref 12.0–15.0)
MCH: 30.7 pg (ref 26.0–34.0)
MCV: 92.5 fL (ref 78.0–100.0)
RBC: 3.61 MIL/uL — ABNORMAL LOW (ref 3.87–5.11)
WBC: 8.3 10*3/uL (ref 4.0–10.5)

## 2012-08-23 LAB — BASIC METABOLIC PANEL
BUN: 58 mg/dL — ABNORMAL HIGH (ref 6–23)
CO2: 28 mEq/L (ref 19–32)
Calcium: 9.5 mg/dL (ref 8.4–10.5)
Chloride: 99 mEq/L (ref 96–112)
Creatinine, Ser: 2.27 mg/dL — ABNORMAL HIGH (ref 0.50–1.10)
Glucose, Bld: 107 mg/dL — ABNORMAL HIGH (ref 70–99)

## 2012-08-23 LAB — PRO B NATRIURETIC PEPTIDE: Pro B Natriuretic peptide (BNP): 9169 pg/mL — ABNORMAL HIGH (ref 0–450)

## 2012-08-23 MED ORDER — FUROSEMIDE 40 MG PO TABS: 40.0000 mg | ORAL_TABLET | Freq: Every day | ORAL | Status: DC

## 2012-08-23 MED ORDER — POLYETHYLENE GLYCOL 3350 17 G PO PACK: 17.0000 g | PACK | Freq: Every day | ORAL | Status: DC | PRN

## 2012-08-23 MED ORDER — ISOSORBIDE MONONITRATE ER 30 MG PO TB24: 30.0000 mg | ORAL_TABLET | Freq: Every day | ORAL | Status: DC

## 2012-08-23 MED ORDER — METOPROLOL SUCCINATE ER 50 MG PO TB24: 50.0000 mg | ORAL_TABLET | Freq: Two times a day (BID) | ORAL | Status: DC

## 2012-08-23 MED ORDER — APIXABAN 2.5 MG PO TABS: 2.5000 mg | ORAL_TABLET | Freq: Two times a day (BID) | ORAL | Status: DC

## 2012-08-23 MED ORDER — HYDRALAZINE HCL 25 MG PO TABS: 25.0000 mg | ORAL_TABLET | Freq: Three times a day (TID) | ORAL | Status: DC

## 2012-08-23 NOTE — Progress Notes (Signed)
Follow-Up Appts: - Dr. Shirlee Latch 08/30/12 at 9:15am - [facility] Visit (since new to [facility] - we periodically follow these patients as well) 09/20/12 at 2pm  Discharging physician: Please request that nursing home draw BMET/CBC on 08/29/12 and send results to Dr. Shirlee Latch.   Dayna Dunn PA-C

## 2012-08-23 NOTE — Progress Notes (Signed)
Patient ID: Melissa Robinson, female   DOB: 03/25/1916, 79 y.o.   MRN: [id number]    SUBJECTIVE: No dyspnea but weak.  Creatinine stable.  On oral Lasix.   Marland Kitchen apixaban  2.5 mg Oral BID  . cholecalciferol  400 Units Oral Daily  . furosemide  40 mg Oral Daily  . hydrALAZINE  25 mg Oral Q8H  . isosorbide mononitrate  30 mg Oral Daily  . metoprolol succinate  50 mg Oral BID  . multivitamin with minerals  1 tablet Oral Daily  . pantoprazole  40 mg Oral Daily  . sodium chloride  3 mL Intravenous Q12H  . sodium chloride  3 mL Intravenous Q12H      Filed Vitals:   08/22/12 1350 08/22/12 1434 08/22/12 2108 08/23/12 0500  BP: 119/70 112/60 106/75 113/65  Pulse: 87  99 88  Temp: 98.1 F (36.7 C)  98.5 F (36.9 C) 98 F (36.7 C)  TempSrc:      Resp: 18  18 18   Height:      Weight:    54.568 kg (120 lb 4.8 oz)  SpO2: 94%  97% 97%    Intake/Output Summary (Last 24 hours) at 08/23/12 0849 Last data filed at 08/22/12 2159  Gross per 24 hour  Intake    683 ml  Output      0 ml  Net    683 ml    LABS: Basic Metabolic Panel:  Recent Labs  40/98/11 0555 08/23/12 0440  NA 139 139  K 4.0 3.9  CL 97 99  CO2 29 28  GLUCOSE 104* 107*  BUN 63* 58*  CREATININE 2.33* 2.27*  CALCIUM 10.0 9.5   Liver Function Tests: No results found for this basename: AST, ALT, ALKPHOS, BILITOT, PROT, ALBUMIN,  in the last 72 hours No results found for this basename: LIPASE, AMYLASE,  in the last 72 hours CBC:  Recent Labs  08/22/12 0555 08/23/12 0440  WBC 7.1 8.3  HGB 12.1 11.1*  HCT 35.6* 33.4*  MCV 91.5 92.5  PLT 195 189   Cardiac Enzymes: No results found for this basename: CKTOTAL, CKMB, CKMBINDEX, TROPONINI,  in the last 72 hours BNP: No components found with this basename: POCBNP,  D-Dimer: No results found for this basename: DDIMER,  in the last 72 hours Hemoglobin A1C: No results found for this basename: HGBA1C,  in the last 72 hours Fasting Lipid Panel: No results found for  this basename: CHOL, HDL, LDLCALC, TRIG, CHOLHDL, LDLDIRECT,  in the last 72 hours Thyroid Function Tests: No results found for this basename: TSH, T4TOTAL, FREET3, T3FREE, THYROIDAB,  in the last 72 hours Anemia Panel: No results found for this basename: VITAMINB12, FOLATE, FERRITIN, TIBC, IRON, RETICCTPCT,  in the last 72 hours  RADIOLOGY: No results found.  PHYSICAL EXAM General: NAD Neck: JVP 8 cm, no thyromegaly or thyroid nodule.  Lungs: Clear bilaterally CV: Nondisplaced PMI.  Heart irregular S1/S2, no S3/S4, no murmur.  No peripheral edema.  Abdomen: Soft, nontender, no hepatosplenomegaly, no distention.  Neurologic: Alert and oriented x 3.  Psych: Normal affect. Extremities: No clubbing or cyanosis.   TELEMETRY: Reviewed telemetry pt in atrial fibrillation, rate 80s  ASSESSMENT AND PLAN: 79 yo with history of cardiomyopathy, paroxysmal atrial fibrillation, and CKD presented with shortness of breath and found to have CHF.  1. Acute on chronic systolic CHF: Echo with EF 35-40%, stable from 2008.  Volume status looks better, creatinine up from baseline. - Continue po Lasix  40 daily.  - Stopped losartan with AKI on CKD, using hydralazine/nitrates for afterload reduction instead.   2. Atrial fibrillation: Suspect acute CHF may be related to going back into atrial fibrillation.   - Stopped atenolol and using Toprol XL 50 mg bid given renal dysfunction.  Rate controlled currently.  - She is steady on her feet with no recent falls.  On lower dosed apixaban.  Will repeat BMET in 1 week, if GFR is < 15, will probably have to stop apixaban.  Right now, GFR > 15 and trending down now that we have stopped IV Lasix. 3. Renal: AKI on CKD. Creatinine stable but above baseline.  Continue to follow closely.  4. Going to [facility].  5. Dysphagia: Had esophageal dilation yesterday.   [name] 08/23/2012 8:49 AM

## 2012-08-23 NOTE — Progress Notes (Signed)
CSW informed pt ready for dc today. CSW prepared and placed packet in pt shadow chart and confirmed with Jasmine December that dc summary and AVS were received via [facility]. Pt and family aware and agreeable. Pt daughter in law providing transport for pt. CSW signing off.  Leliana Kontz, LCSWA  Theresia Bough, LCSWA

## 2012-08-23 NOTE — Discharge Summary (Signed)
DISCHARGE SUMMARY  Melissa Robinson  MR#: [id number]  DOB:[birth year]  Date of Admission: 08/17/2012 Date of Discharge: 08/23/2012  Attending Physician:Melissa Robinson,Melissa Robinson  Patient's WUJ:WJXB,Melissa [name], MD  Consults: Melissa Ancona, MD- Cardiology Melissa Flemings, MD- GI  Discharge Diagnoses: Principal Problem:   Acute respiratory failure secondary to Acute on chronic systolic congestive heart failure (EF 35%) Active Problems:   Atrial fibrillationANEMIA, IRON DEFICIENCY   Chronic renal insufficiency, stage IV (severe)   Dysphagia secondary to Stricture and stenosis of esophagus  Past Medical History paroxysmal Afib  Recurrent esophageal stricture s/p dilation (5/14)  HTN  Osteoporosis--> T10/11/12 compression fracture s/p kyphoplasty (12/10), Right hip fracture s/p ORIF (1/12)-->Prolia 12/12-  L renal cyst  Chronic renal insufficiency, stage 3  Gait instability  Dysphagia-->EGD with dilatation (5/14)  Hypertriglyceridemia  CHF (EF 35% in 5/08)  Colon cancer, stage III s/p hemicolectomy  Sleep apnea  Arthritis  Cholelithiasis  Gout  GERD  Urinary urge incontinence  Anemia  Past Surgical History Small bowel resection  Compression Fx (T11/T12), T10-->kyphoplasty (12/10)  Rotator cuff  L renal cyst aspiration  EGD Melissa/ dilation (5/14)  Discharge Medications:   Medication List    STOP taking these medications       amLODipine 10 MG tablet  Commonly known as:  NORVASC     atenolol 100 MG tablet  Commonly known as:  TENORMIN     losartan 100 MG tablet  Commonly known as:  COZAAR      TAKE these medications       apixaban 2.5 MG Tabs tablet  Commonly known as:  ELIQUIS  Take 1 tablet (2.5 mg total) by mouth 2 (two) times daily.     CALCIUM-VITAMIN D PO  Take 1 tablet by mouth 2 (two) times daily.     cholecalciferol 400 UNITS Tabs  Commonly known as:  VITAMIN D  Take 400 Units by mouth daily.     cyanocobalamin 500 MCG tablet  Take 500 mcg by mouth daily.      denosumab 60 MG/ML Soln injection  Commonly known as:  PROLIA  Inject 60 mg into the skin every 6 (six) months. Administer in upper arm, thigh, or abdomen     furosemide 40 MG tablet  Commonly known as:  LASIX  Take 1 tablet (40 mg total) by mouth daily.     hydrALAZINE 25 MG tablet  Commonly known as:  APRESOLINE  Take 1 tablet (25 mg total) by mouth every 8 (eight) hours.     iron polysaccharides 150 MG capsule  Commonly known as:  NIFEREX  Take 150 mg by mouth daily.     isosorbide mononitrate 30 MG 24 hr tablet  Commonly known as:  IMDUR  Take 1 tablet (30 mg total) by mouth daily.     metoprolol succinate 50 MG 24 hr tablet  Commonly known as:  TOPROL-XL  Take 1 tablet (50 mg total) by mouth 2 (two) times daily. Take with or immediately following a meal.     mirabegron ER 25 MG Tb24  Commonly known as:  MYRBETRIQ  Take 25 mg by mouth daily.     multivitamin with minerals Tabs  Take 1 tablet by mouth daily.     omeprazole 40 MG capsule  Commonly known as:  PRILOSEC  Tablet twice daily- 30 minutes before breakfast and supper     polyethylene glycol packet  Commonly known as:  MIRALAX / GLYCOLAX  Take 17 g by mouth daily as needed.  traMADol 50 MG tablet  Commonly known as:  ULTRAM  Take 50 mg by mouth every 8 (eight) hours as needed for pain.        Hospital Procedures: Dg Chest 2 View  08/18/2012   *RADIOLOGY REPORT*  Clinical Data: Pulmonary edema.  CHEST - 2 VIEW  Comparison: 01/28/2010  Findings: Cardiomegaly.  Patchy bilateral perihilar airspace opacities, right greater than left.  This could represent asymmetric edema or infection.  Suspect small bilateral effusions.  Numerous mild to moderate compression fractures in the lower thoracic spine, presumably chronic.  IMPRESSION: Patchy bilateral perihilar airspace opacities, right greater than left.  This could represent asymmetric edema or infection.  Small bilateral effusions.   Original Report Authenticated  By: Charlett Nose, M.D.   Dg Esophagus  08/20/2012   *RADIOLOGY REPORT*  Clinical Data:Dysphagia.  History of stricture status post dilatation.  ESOPHAGUS/BARIUM SWALLOW/TABLET STUDY  Fluoroscopy Time: 3 minutes and 47 seconds.  Comparison: No priors.  Findings: Initial double contrast images of the esophagus demonstrates some thickening of the esophageal folds, suggestive of esophagitis.  Multiple single swallow attempts demonstrated failure of any of the primary peristaltic waves to propagate fully. Additionally, multiple tertiary contractions were noted.  Full column barium esophagram demonstrated no esophageal mass or ring. Persistent narrowing of the distal esophagus at the level of the gastroesophageal junction was noted.  A barium tablet was administered which could not pass through this area of narrowing.  IMPRESSION: 1.  Distal esophageal stricture shortly above the gastroesophageal junction. 2.  Nonspecific esophageal motility disorder with strong tertiary contractions. 3.  Mild thickening of the esophageal mucosal folds suggestive of esophagitis.   Original Report Authenticated By: Trudie Reed, M.D.   Echocardiogram (7/24)- - Normal LV size with EF 35-40%. Diffuse hypokinesis with septal-lateral dyssynchrony. Restrictive diastolic function. E/medial e' > 15 suggests LV end diastolic pressure at least 20 mmHg. Normal RV size with mildly decreased systolic function. Mild pulmonary hypertension.  Endoscopy (7/28)- ENDOSCOPIC IMPRESSION:  1. Distal stricture s/p dilation  2. Esophageal dysmotility  History of Present Illness: Melissa Robinson is a 79 year old white female with a history of paroxysmal atrial fibrillation, congestive heart failure (ejection fraction 35%), CRI who presented to the office today with complaint of increasing shortness of breath and weakness. Patient reports that she's had increasing swallowing difficulties recently. She underwent esophageal dilatation on 5/14 after  presenting to the emergency room several weeks earlier with an esophageal impaction. She did well for several weeks but over the past few weeks has noticed increased dysphagia. She was a she is scheduled for an upper GI barium swallow today, but when she awoke this morning she felt very weak and short of breath. She canceled her barium swallow and came to our office for evaluation where she is found to be in atrial fibrillation with evidence of congestive heart failure on chest x-ray and exam. She'll be admitted for further management.  Of note, the patient did have A. fib when she presented to the hospital in 4/14 with her esophageal obstruction. Rate was controlled on her home regimen and she was not felt to be a good anticoagulation candidate. She has remained relatively asymptomatic since that time and was in sinus rhythm within seen in the office recently. She has noted increased swelling recently and has remained on Lasix daily.  Hospital Course: Ms. Traore was admitted to a medical bed for management of her acute on chronic congestive heart failure in the setting of atrial fibrillation. She was treated  with IV Lasix with excellent diuresis. Echocardiogram was performed and showed an ejection fraction of 35-40% Cardiology was consulted and recommended changing her atenolol to Toprol. Due to worsening renal function her losartan was discontinued and she was placed on hydralazine and Imdur for afterload reduction. With diuresis her BNP trended down from 26,560 to 9169. Her weight decreased 3 kg and stabilized. She did have an increase in her creatinine with diuresis but this has also stabilized at 2.27, up from her baseline of 1.8. She has improved significantly with adjustments in her diuretics and medical therapy.  For her atrial fibrillation she was continued on rate control with Toprol. Given the recent safety data comparing Apixaban with ASA in patients with atrial fibrillation, cardiology  recommended anticoagulation with Eliquis (renally dosed).  She is at high fall risk but has not had any recent falls. With fall precautions and no evidence of bleeding this will be continued.   The patient was having progressive dysphagia symptoms prior to admission. She was actually scheduled for an outpatient evaluation by Dr. [name] when her cardiac symptoms developed. Once her cardiac status stabilized she underwent an esophagram that showed a distal esophageal stricture. GI was consulted and performed an EGD with distal esophagus dilatation. She tolerated this well and was tolerating a regular diet following the procedure. Her Eliquis has been resumed following this procedure.  At this time, the patient is medically stable for discharge but is deconditioned. Physical therapy and occupational therapy have seen her and have recommended skilled nursing facility rehabilitation. Arrangements are being made for discharge to [facility] for short-term rehabilitation prior to returning home where she is living independently.   Day of Discharge Exam BP 113/65  Pulse 88  Temp(Src) 98 F (36.7 C) (Oral)  Resp 18  Ht 5\' 4"  (1.626 m)  Wt 54.568 kg (120 lb 4.8 oz)  BMI 20.64 kg/m2  SpO2 97%  Physical Exam: General appearance: alert and no distress Eyes: no scleral icterus Throat: oropharynx moist without erythema Resp: Minimal bibasilar crackles Cardio: irregularly irregular rhythm GI: soft, non-tender; bowel sounds normal; no masses,  no organomegaly Extremities: no clubbing, cyanosis or edema  Discharge Labs:  Recent Labs  08/22/12 0555 08/23/12 0440  NA 139 139  K 4.0 3.9  CL 97 99  CO2 29 28  GLUCOSE 104* 107*  BUN 63* 58*  CREATININE 2.33* 2.27*  CALCIUM 10.0 9.5   No results found for this basename: AST, ALT, ALKPHOS, BILITOT, PROT, ALBUMIN,  in the last 72 hours  Recent Labs  08/22/12 0555 08/23/12 0440  WBC 7.1 8.3  HGB 12.1 11.1*  HCT 35.6* 33.4*  MCV 91.5 92.5   PLT 195 189   Lab Results  Component Value Date   INR 1.11 01/28/2010   INR 1.09 12/19/2008   BNP (last 3 results)  Recent Labs  08/21/12 0710 08/22/12 0555 08/23/12 0440  PROBNP 8854.0* 9449.0* 9169.0*    Discharge instructions:     Discharge Orders   Future Appointments Provider Department Dept Phone   08/24/2012 10:30 AM [facility] R/F [facility] [phone]   Future Orders Complete By Expires     Diet - low sodium heart healthy  As directed     Discharge instructions  As directed     Comments:      Weigh daily and call if weight increases more than 3 lbs from baseline, increased shortness of breath, swelling.  Adhere to low sodium diet and limit fluid intake to  per day.    Increase activity slowly  As directed        Disposition: To [facility] skilled nursing facility rehabilitation.  Follow-up Appts: Follow-up with Dr. Clelia Croft at [facility] within 1 week after discharge from SNF.  Call for appointment.  Condition on Discharge: Stable  Tests Needing Follow-up: None  Time with Discharge activities: 45 minutes  Signed: Kazi Montoro,Melissa Robinson 08/23/2012, 7:41 AM

## 2012-08-23 NOTE — Progress Notes (Signed)
Pt discharged to [facility] per MD order. Pt and daughter received and reviewed all discharge instructions and medication information including follow-up appointments and prescriptions. Pt and daughter verbalized understanding. Pt alert and oriented at discharge with no complaints of pain. Pt escorted to private vehicle via wheelchair per pt and family request by guest services volunteer.  All belongings sent with pt. Report called to receiving RN at [facility].  Efraim Kaufmann

## 2012-08-24 ENCOUNTER — Encounter: Payer: Self-pay | Admitting: Adult Health

## 2012-08-24 ENCOUNTER — Other Ambulatory Visit (HOSPITAL_COMMUNITY): Payer: Medicare Other

## 2012-08-24 ENCOUNTER — Non-Acute Institutional Stay (SKILLED_NURSING_FACILITY): Payer: Medicare Other | Admitting: Adult Health

## 2012-08-24 DIAGNOSIS — D509 Iron deficiency anemia, unspecified: Secondary | ICD-10-CM

## 2012-08-24 DIAGNOSIS — R1310 Dysphagia, unspecified: Secondary | ICD-10-CM

## 2012-08-24 DIAGNOSIS — N318 Other neuromuscular dysfunction of bladder: Secondary | ICD-10-CM

## 2012-08-24 DIAGNOSIS — K219 Gastro-esophageal reflux disease without esophagitis: Secondary | ICD-10-CM

## 2012-08-24 DIAGNOSIS — I5022 Chronic systolic (congestive) heart failure: Secondary | ICD-10-CM

## 2012-08-24 DIAGNOSIS — I10 Essential (primary) hypertension: Secondary | ICD-10-CM

## 2012-08-24 DIAGNOSIS — N184 Chronic kidney disease, stage 4 (severe): Secondary | ICD-10-CM

## 2012-08-24 DIAGNOSIS — N3281 Overactive bladder: Secondary | ICD-10-CM

## 2012-08-24 DIAGNOSIS — I509 Heart failure, unspecified: Secondary | ICD-10-CM | POA: Insufficient documentation

## 2012-08-24 DIAGNOSIS — I4891 Unspecified atrial fibrillation: Secondary | ICD-10-CM

## 2012-08-24 NOTE — Progress Notes (Signed)
Patient ID: Melissa Robinson, female   DOB: 14-Apr-1916, 79 y.o.   MRN: [id number]       PROGRESS NOTE  DATE: 08/24/2012  FACILITY: Nursing Home Location: [facility]  LEVEL OF CARE: SNF (31)  CHIEF COMPLAINT:  Follow-up Hospitalization   HISTORY OF PRESENT ILLNESS: This is a 79 year old female who has been admitted to [facility] on 08/23/12 from [facility] with discharge diagnoses of acute on chronic congestive heart failure with atrial fibrillation. She was also having progressive dysphagia prior to hospitalization and so once she got stabilized in the hospital she had EGD with distal esophagus dilatation. She has been admitted for a short-term rehabilitation  REASSESSMENT OF ONGOING PROBLEM(S):  HTN: Pt 's HTN remains stable.  Denies CP, sob, DOE, pedal edema, headaches, dizziness or visual disturbances.  No complications from the medications currently being used.  Last BP : 122/79  ATRIAL FIBRILLATION: the patients atrial fibrillation remains stable.  The patient denies DOE, tachycardia, orthopnea, transient neurological sx, pedal edema, palpitations, & PNDs.  No complications noted from the medications currently being used.  CHF:The patient does not relate significant weight changes, denies sob, DOE, orthopnea, PNDs, pedal edema, palpitations or chest pain.  CHF remains stable.  No complications form the medications being used.   PAST MEDICAL HISTORY : Reviewed.  No changes.  CURRENT MEDICATIONS: Reviewed per [facility]  REVIEW OF SYSTEMS:  GENERAL: no change in appetite, no fatigue, no weight changes, no fever, chills or weakness RESPIRATORY: no cough, SOB, DOE, wheezing, hemoptysis CARDIAC: no chest pain, edema or palpitations GI: no abdominal pain, diarrhea, constipation, heart burn, nausea or vomiting  PHYSICAL EXAMINATION  VS:  T 98.3       P 97      RR 20      BP 123/79     POX 96 %     WT 121 (Lb)  GENERAL: no acute distress, normal body habitus NECK:  supple, trachea midline, no neck masses, no thyroid tenderness, no thyromegaly LYMPHATICS: no LAN in the neck, no supraclavicular LAN RESPIRATORY: breathing is even & unlabored, BS CTAB CARDIAC: irregularly irregular, no murmur,no extra heart sounds, no edema GI: abdomen soft, normal BS, no masses, no tenderness, no hepatomegaly, no splenomegaly PSYCHIATRIC: the patient is alert & oriented to person, affect & behavior appropriate  LABS/RADIOLOGY: 08/23/12 sodium 139 potassium 2.9 glucose 107 BUN 58 creatinine 2.27 call she'll 9.5 WBC 8.3 hemoglobin 11.1 hematocrit 33.4   ASSESSMENT/PLAN:  Congestive heart failure, chronic - well-compensated  Anemia, iron deficiency - stable  Atrial fibrillation - rate controlled  Chronic renal insufficiency, stage IV - stable  GERD - stable  Hypertension - well controlled  Overactive bladder - stable  Dysphagia S/P EGD with distal esophagus dilatation - swallows without difficulty   CPT CODE: 14782

## 2012-08-25 ENCOUNTER — Encounter: Payer: Self-pay | Admitting: Internal Medicine

## 2012-08-25 ENCOUNTER — Non-Acute Institutional Stay (SKILLED_NURSING_FACILITY): Payer: Medicare Other | Admitting: Internal Medicine

## 2012-08-25 DIAGNOSIS — S72001A Fracture of unspecified part of neck of right femur, initial encounter for closed fracture: Secondary | ICD-10-CM | POA: Insufficient documentation

## 2012-08-25 DIAGNOSIS — N189 Chronic kidney disease, unspecified: Secondary | ICD-10-CM | POA: Insufficient documentation

## 2012-08-25 DIAGNOSIS — N184 Chronic kidney disease, stage 4 (severe): Secondary | ICD-10-CM

## 2012-08-25 DIAGNOSIS — D509 Iron deficiency anemia, unspecified: Secondary | ICD-10-CM

## 2012-08-25 DIAGNOSIS — K222 Esophageal obstruction: Secondary | ICD-10-CM

## 2012-08-25 DIAGNOSIS — C189 Malignant neoplasm of colon, unspecified: Secondary | ICD-10-CM | POA: Insufficient documentation

## 2012-08-25 DIAGNOSIS — I509 Heart failure, unspecified: Secondary | ICD-10-CM

## 2012-08-25 DIAGNOSIS — I4891 Unspecified atrial fibrillation: Secondary | ICD-10-CM

## 2012-08-25 DIAGNOSIS — K219 Gastro-esophageal reflux disease without esophagitis: Secondary | ICD-10-CM | POA: Insufficient documentation

## 2012-08-25 DIAGNOSIS — I5023 Acute on chronic systolic (congestive) heart failure: Secondary | ICD-10-CM

## 2012-08-25 DIAGNOSIS — M810 Age-related osteoporosis without current pathological fracture: Secondary | ICD-10-CM | POA: Insufficient documentation

## 2012-08-25 DIAGNOSIS — D649 Anemia, unspecified: Secondary | ICD-10-CM | POA: Insufficient documentation

## 2012-08-25 NOTE — Assessment & Plan Note (Addendum)
Pt seems fully recovered and is anxious to get her PT done, especially as regards to balance, and to get home.pt appears to be excellent candidate

## 2012-08-25 NOTE — Assessment & Plan Note (Signed)
On toprol and eliquis (renal dose)

## 2012-08-25 NOTE — Progress Notes (Signed)
MRN: [id number] Name: Melissa Robinson  Sex: female Age: 79 y.o. DOB: 02-25-1916  PSC #: [email or web address] Facility/Room:605 Level Of Care: SNF Provider: Merrilee Seashore D Emergency Contacts: Extended Emergency Contact Information Primary Emergency Contact: Vaughan,Carole Address: [address]          [place], [place] Home Phone: [phone] Work Phone: [phone] Relation: Other Secondary Emergency Contact: Windy Fast [place] Home Phone: [phone] Relation: Daughter  Code Status: DNR  Allergies: Review of patient's allergies indicates no known allergies.  Chief Complaint  Patient presents with  . nursing home ADMISSION    HPI: Patient is 79 y.o. female who was just hospitalized for acute on chronic CHF and dysphagia with esophageal dilitation for same who is now deconditioned and hear for PT.  Past Medical History  Diagnosis Date  . Hyperlipemia   . Neuropathy   . Atrial fibrillation     a. Noted during 04/2012 hospitalization.  . Peptic stricture of esophagus     a. Recurrent, s/p previous diltiation. b. Food impaction 04/2012 - had dilitation 05/2012, but dysphagia recurred 07/2012.  Marland Kitchen Hip fracture, right     a. 2012 from a fall.  . Mitral regurgitation     a. Mod by echo 2008.  Marland Kitchen Aortic insufficiency     a. Mild by echo 2008.  Marland Kitchen Urinary incontinence   . Gout   . Arthritis   . Sleep apnea   . Gait instability   . LBBB (left bundle branch block)   . Anemia     Previously reported as IDA in setting of colon CA, then anemia of chronic disease.  . Colon cancer     a. Stage III colon CA s/p R lap-assisted colectomy 05/2006.  Marland Kitchen CHF (congestive heart failure)     a. Echo 2008 in setting of acute illness: EF 35-40%, mod LVH, mild AI, moderate MR, LA mildly dilated, PA pressure .  Marland Kitchen GERD (gastroesophageal reflux disease)   . Hypertension   . CKD (chronic kidney disease)     a. Stage III.  Marland Kitchen Renal cyst, left   . Osteoporosis     a.  T10/11/12 compression fracture s/p kyphoplasty (12/10). b. R hip fracture s/p ORIF (1/12)-->Prolia 12/12.    Past Surgical History  Procedure Laterality Date  . Esophagogastroduodenoscopy    . Colonosopy    . Hip fracture surgery    . Esophagogastroduodenoscopy N/A 05/21/2012    Procedure: ESOPHAGOGASTRODUODENOSCOPY (EGD);  Surgeon: Hilarie Fredrickson, MD;  Location[facility] ENDOSCOPY;  Service: Endoscopy;  Laterality: N/A;  . Esophagogastroduodenoscopy (egd) with esophageal dilation N/A 08/22/2012    Procedure: ESOPHAGOGASTRODUODENOSCOPY (EGD) WITH ESOPHAGEAL DILATION;  Surgeon: Hilarie Fredrickson, MD;  Location: [facility] ENDOSCOPY;  Service: Endoscopy;  Laterality: N/A;      Medication List       This list is accurate as of: 08/25/12  2:48 PM.  Always use your most recent med list.               apixaban 2.5 MG Tabs tablet  Commonly known as:  ELIQUIS  Take 1 tablet (2.5 mg total) by mouth 2 (two) times daily.     CALCIUM-VITAMIN D PO  Take 1 tablet by mouth 2 (two) times daily.     cholecalciferol 400 UNITS Tabs  Commonly known as:  VITAMIN D  Take 400 Units by mouth daily.     cyanocobalamin 500 MCG tablet  Take 500 mcg by mouth daily.  denosumab 60 MG/ML Soln injection  Commonly known as:  PROLIA  Inject 60 mg into the skin every 6 (six) months. Administer in upper arm, thigh, or abdomen     furosemide 40 MG tablet  Commonly known as:  LASIX  Take 1 tablet (40 mg total) by mouth daily.     hydrALAZINE 25 MG tablet  Commonly known as:  APRESOLINE  Take 1 tablet (25 mg total) by mouth every 8 (eight) hours.     iron polysaccharides 150 MG capsule  Commonly known as:  NIFEREX  Take 150 mg by mouth daily.     isosorbide mononitrate 30 MG 24 hr tablet  Commonly known as:  IMDUR  Take 1 tablet (30 mg total) by mouth daily.     metoprolol succinate 50 MG 24 hr tablet  Commonly known as:  TOPROL-XL  Take 1 tablet (50 mg total) by mouth 2 (two) times daily. Take with or immediately  following a meal.     mirabegron ER 25 MG Tb24  Commonly known as:  MYRBETRIQ  Take 25 mg by mouth daily.     multivitamin with minerals Tabs  Take 1 tablet by mouth daily.     omeprazole 40 MG capsule  Commonly known as:  PRILOSEC  Tablet twice daily- 30 minutes before breakfast and supper     polyethylene glycol packet  Commonly known as:  MIRALAX / GLYCOLAX  Take 17 g by mouth daily as needed.     traMADol 50 MG tablet  Commonly known as:  ULTRAM  Take 50 mg by mouth every 8 (eight) hours as needed for pain.        No orders of the defined types were placed in this encounter.     There is no immunization history on file for this patient.  History  Substance Use Topics  . Smoking status: Never Smoker   . Smokeless tobacco: Not on file  . Alcohol Use: No    Family history is noncontributory    Review of Systems  DATA OBTAINED: from patient, family member GENERAL: Feels well no fevers, fatigue, appetite changes SKIN: No itching, rash or wounds EYES: No eye pain, redness, discharge EARS: No earache, tinnitus, change in hearing NOSE: No congestion, drainage or bleeding  MOUTH/THROAT: No mouth or tooth pain, No sore throat, No difficulty chewing or swallowing  RESPIRATORY: No cough, wheezing, SOB CARDIAC: No chest pain, palpitations, lower extremity edema  GI: No abdominal pain, No N/V/D or constipation, No heartburn or reflux  GU: No dysuria, frequency or urgency, or incontinence  MUSCULOSKELETAL: No unrelieved bone/joint pain NEUROLOGIC: Awake, alert, appropriate to situation, No change in mental status. Moves all four, no focal deficits PSYCHIATRIC: No overt anxiety or sadness. Sleeps well. No behavior issue.    Filed Vitals:   08/25/12 1340  BP: 118/76  Pulse: 67  Temp: 96.4 F (35.8 C)  Resp: 18    Physical Exam  GENERAL APPEARANCE: Alert, conversant. Appropriately groomed. No acute distress. Appears much younger than her 89 years SKIN: No  diaphoresis rash, or wounds HEAD: Normocephalic, atraumatic  EYES: Conjunctiva/lids clear. Pupils round, reactive. EOMs intact.  EARS: External exam WNL, canals clear. Hearing grossly normal.  NOSE: No deformity or discharge.  MOUTH/THROAT: Lips w/o lesions. Mouth RESPIRATORY: Breathing is even, unlabored. Lung sounds are clear   CARDIOVASCULAR: Heart RRR no murmurs, rubs or gallops. No peripheral edema.  ARTERIAL: radial pulse 2+, DP pulse 1+  VENOUS: No varicosities. No venous stasis skin  changes  GASTROINTESTINAL: Abdomen is soft, non-tender, not distended w/ normal bowel sounds. GENITOURINARY: Bladder non tender, not distended  MUSCULOSKELETAL: No abnormal joints or musculature NEUROLOGIC: Oriented X3. Cranial nerves 2-12 grossly intact. Moves all extremities no tremor. PSYCHIATRIC: Mood and affect appropriate to situation, no behavioral issues  Patient Active Problem List   Diagnosis Date Noted  . Anemia   . Colon cancer   . GERD (gastroesophageal reflux disease)   . CKD (chronic kidney disease)   . Osteoporosis   . Peptic stricture of esophagus   . Hip fracture, right   . Overactive bladder 08/24/2012  . CHF (congestive heart failure) 08/24/2012  . Stricture and stenosis of esophagus 08/22/2012  . Nonspecific (abnormal) findings on radiological and other examination of gastrointestinal tract 08/22/2012  . Acute respiratory failure 08/18/2012  . Acute on chronic systolic congestive heart failure 08/18/2012  . Atrial fibrillation 08/18/2012  . Food impaction of esophagus 05/21/2012  . Dysphagia 05/21/2012  . Paroxysmal atrial fibrillation 05/21/2012  . Chronic renal insufficiency, stage IV (severe) 05/21/2012  . Systolic CHF, chronic 05/21/2012  . Hypertension 05/21/2012  . CARCINOMA, COLON, HX OF 08/23/2007  . COLONIC POLYPS 08/19/2007  . HYPERLIPIDEMIA 08/19/2007  . ANEMIA, IRON DEFICIENCY 08/19/2007  . HYPERTENSION 08/19/2007  . HEMORRHOIDS 08/19/2007  . GERD  08/19/2007  . PEPTIC STRICTURE 08/19/2007  . SLEEP APNEA 08/19/2007  . NEPHROLITHIASIS, HX OF 08/19/2007    CBC    Component Value Date/Time   WBC 8.3 08/23/2012 0440   WBC 5.8 07/05/2008 1014   RBC 3.61* 08/23/2012 0440   RBC 3.80 07/05/2008 1014   HGB 11.1* 08/23/2012 0440   HGB 11.5* 07/05/2008 1014   HCT 33.4* 08/23/2012 0440   HCT 34.7* 07/05/2008 1014   PLT 189 08/23/2012 0440   PLT 160 07/05/2008 1014   MCV 92.5 08/23/2012 0440   MCV 91.3 07/05/2008 1014   LYMPHSABS 1.4 05/21/2012 0950   LYMPHSABS 2.1 07/05/2008 1014   MONOABS 0.4 05/21/2012 0950   MONOABS 0.4 07/05/2008 1014   EOSABS 0.1 05/21/2012 0950   EOSABS 0.1 07/05/2008 1014   BASOSABS 0.0 05/21/2012 0950   BASOSABS 0.0 07/05/2008 1014    CMP     Component Value Date/Time   NA 139 08/23/2012 0440   K 3.9 08/23/2012 0440   CL 99 08/23/2012 0440   CO2 28 08/23/2012 0440   GLUCOSE 107* 08/23/2012 0440   BUN 58* 08/23/2012 0440   CREATININE 2.27* 08/23/2012 0440   CALCIUM 9.5 08/23/2012 0440   PROT 6.7 08/17/2012 1606   ALBUMIN 3.4* 08/17/2012 1606   AST 32 08/17/2012 1606   ALT 34 08/17/2012 1606   ALKPHOS 65 08/17/2012 1606   BILITOT 0.8 08/17/2012 1606   GFRNONAA 17* 08/23/2012 0440   GFRAA 20* 08/23/2012 0440    Assessment and Plan  Atrial fibrillation On toprol and eliquis (renal dose)  Peptic stricture of esophagus Stable now  Acute on chronic systolic congestive heart failure Pt seems fully recovered and is anxious to get her PT done, especially as regards to balance, and to get home.    Margit Hanks, MD

## 2012-08-25 NOTE — Progress Notes (Signed)
This encounter was created in error - please disregard.

## 2012-08-25 NOTE — Assessment & Plan Note (Signed)
Stable now. 

## 2012-08-26 ENCOUNTER — Encounter: Payer: Self-pay | Admitting: *Deleted

## 2012-08-30 ENCOUNTER — Telehealth: Payer: Self-pay | Admitting: Cardiology

## 2012-08-30 ENCOUNTER — Encounter: Payer: Medicare Other | Admitting: Cardiology

## 2012-08-30 NOTE — Telephone Encounter (Signed)
Melissa Robinson is aware pt coming tomorrow for appt, she did not come today

## 2012-08-30 NOTE — Telephone Encounter (Signed)
New problem   [facility] stated to please fax over any order from today visit to [phone] if you have any.

## 2012-08-31 ENCOUNTER — Encounter: Payer: Self-pay | Admitting: Cardiology

## 2012-08-31 ENCOUNTER — Ambulatory Visit (INDEPENDENT_AMBULATORY_CARE_PROVIDER_SITE_OTHER): Payer: Medicare Other | Admitting: Cardiology

## 2012-08-31 VITALS — BP 120/65 | HR 99 | Ht 65.0 in | Wt 124.1 lb

## 2012-08-31 DIAGNOSIS — I4891 Unspecified atrial fibrillation: Secondary | ICD-10-CM

## 2012-08-31 DIAGNOSIS — I509 Heart failure, unspecified: Secondary | ICD-10-CM

## 2012-08-31 DIAGNOSIS — I5022 Chronic systolic (congestive) heart failure: Secondary | ICD-10-CM

## 2012-08-31 DIAGNOSIS — N189 Chronic kidney disease, unspecified: Secondary | ICD-10-CM

## 2012-08-31 LAB — BASIC METABOLIC PANEL
BUN: 43 mg/dL — ABNORMAL HIGH (ref 6–23)
CO2: 28 mEq/L (ref 19–32)
Calcium: 9.5 mg/dL (ref 8.4–10.5)
Chloride: 105 mEq/L (ref 96–112)
Creatinine, Ser: 2.1 mg/dL — ABNORMAL HIGH (ref 0.4–1.2)
Glucose, Bld: 98 mg/dL (ref 70–99)

## 2012-08-31 NOTE — Patient Instructions (Addendum)
Your physician recommends that you have  lab work today--BMET.   Your physician recommends that you schedule a follow-up appointment in: 3 weeks with Dr Shirlee Latch. This is scheduled for Tuesday August 26,2014 at 1:30PM.

## 2012-08-31 NOTE — Progress Notes (Signed)
Patient ID: Melissa Robinson, female   DOB: [birth year], 79 y.o.   MRN: [id number] PCP: Dr. Clelia Croft  79 yo with chronic systolic CHF, persistent atrial fibrillation, and CKD presents for cardiology followup after recent admission for atrial fibrillation with acute on chronic systolic CHF.  She was admitted in 7/14 with dyspnea and volume overload.  She was noted to be in atrial fibrillation again (had a first episode that was transient in 4/14).  This time, the atrial fibrillation was persistent.  She was started on reduced-dose apixaban and diuresed.  Her renal function worsened a bit.  She lost significant weight and was converted over to po Lasix.  She had been living by herself and is now in a rehab facility.    Currently, she is doing well at rehab.  She walks with a walker.  She can walk > 100 yards without stopping but still has some dyspnea with longer distances.  No lightheadedness or syncope.  No chest pain.  No orthopnea/PND.   ECG: Atrial fibrillation, LBBB  Labs (7/14): K 3.9, creatinine 2.27, HCT 33.4  PMH: 1. Gout 2. Chronic LBBB 3. H/o OSA 4. Chronic systolic CHF: Cardiomyopathy of uncertain etiology, possibly nonischemic.  Echo (7/14) with EF 35-40%, diffuse hypokinesis, normal RV with mildly decreased RV systolic function, PA systolic pressure 46 mmHg.  5. GERD with recurrent esophageal strictures, last dilatation in 7/14.  6. Atrial fibrillation: Now persistent 7. Colon cancer: s/p lap-colectomy in 5/08.   8. Anemia of chronic disease 9. CKD 10. H/o hip fracture  SH: Currently in rehab at [facility].  Widow, 10 children, nonsmoker.   FH: No premature CAD  ROS: All systems reviewed and negative except as per HPI.   Current Outpatient Prescriptions  Medication Sig Dispense Refill  . apixaban (ELIQUIS) 2.5 MG TABS tablet Take 1 tablet (2.5 mg total) by mouth 2 (two) times daily.  60 tablet  6  . CALCIUM-VITAMIN D PO Take 1 tablet by mouth 2 (two) times daily.      .  cholecalciferol (VITAMIN D) 400 UNITS TABS Take 400 Units by mouth daily.      . cyanocobalamin 500 MCG tablet Take 500 mcg by mouth daily.      Marland Kitchen denosumab (PROLIA) 60 MG/ML SOLN injection Inject 60 mg into the skin every 6 (six) months. Administer in upper arm, thigh, or abdomen      . furosemide (LASIX) 40 MG tablet Take 1 tablet (40 mg total) by mouth daily.  30 tablet  6  . hydrALAZINE (APRESOLINE) 25 MG tablet Take 1 tablet (25 mg total) by mouth every 8 (eight) hours.  90 tablet  6  . iron polysaccharides (NIFEREX) 150 MG capsule Take 150 mg by mouth daily.      . isosorbide mononitrate (IMDUR) 30 MG 24 hr tablet Take 1 tablet (30 mg total) by mouth daily.  30 tablet  6  . metoprolol succinate (TOPROL-XL) 50 MG 24 hr tablet Take 1 tablet (50 mg total) by mouth 2 (two) times daily. Take with or immediately following a meal.  30 tablet  6  . mirabegron ER (MYRBETRIQ) 25 MG TB24 Take 25 mg by mouth daily.      . Multiple Vitamin (MULTIVITAMIN WITH MINERALS) TABS Take 1 tablet by mouth daily.      Marland Kitchen omeprazole (PRILOSEC) 40 MG capsule Tablet twice daily- 30 minutes before breakfast and supper  60 capsule  11  . polyethylene glycol (MIRALAX / GLYCOLAX) packet Take 17  g by mouth daily as needed.  14 each  0  . traMADol (ULTRAM) 50 MG tablet Take 50 mg by mouth every 8 (eight) hours as needed for pain.       No current facility-administered medications for this visit.    BP 120/65  Pulse 99  Ht 5\' 5"  (1.651 m)  Wt 56.3 kg (124 lb 1.9 oz)  BMI 20.65 kg/m2 General: NAD Neck: No JVD, no thyromegaly or thyroid nodule.  Lungs: Clear to auscultation bilaterally with normal respiratory effort. CV: Nondisplaced PMI.  Heart irregular S1/S2, no S3/S4, no murmur.  No peripheral edema.  No carotid bruit.  Normal pedal pulses.  Abdomen: Soft, nontender, no hepatosplenomegaly, no distention.  Skin: Intact without lesions or rashes.  Neurologic: Alert and oriented x 3.  Psych: Normal  affect. Extremities: No clubbing or cyanosis.   Assessment/Plan: 1. Atrial fibrillation: Now persistent.  Rate-controlled with Toprol XL 50 bid.  Will continue this dose for now.  She is on renally dosed apixaban.  I am going to continue this for now as well (no falls and steady on her feet) but will repeat BMET today.  For now, would follow rate control and anticoagulation strategy. However, if she continues to have significant exertional dyspnea, could consider DCCV after 1 month of apixaban.  I will see her back in 1 month to see how she is doing.  2. Chronic systolic CHF: EF 16-10% on last echo.  No known history of CAD.  Possible LBBB cardiomyopathy.  She looks near-euvolemic today.  - Continue Toprol XL at current dose.  - No ACEI with CKD, continue hydralazine/Imdur.  - Continue current Lasix dose.  - Will need daily weights at facility.  3. CKD: Repeat BMET today to see how creatinine is doing.   [name] 08/31/2012 11:44 AM

## 2012-09-08 ENCOUNTER — Non-Acute Institutional Stay (SKILLED_NURSING_FACILITY): Payer: Medicare Other | Admitting: Adult Health

## 2012-09-08 DIAGNOSIS — N318 Other neuromuscular dysfunction of bladder: Secondary | ICD-10-CM

## 2012-09-08 DIAGNOSIS — I10 Essential (primary) hypertension: Secondary | ICD-10-CM

## 2012-09-08 DIAGNOSIS — I509 Heart failure, unspecified: Secondary | ICD-10-CM

## 2012-09-08 DIAGNOSIS — I5022 Chronic systolic (congestive) heart failure: Secondary | ICD-10-CM

## 2012-09-08 DIAGNOSIS — N184 Chronic kidney disease, stage 4 (severe): Secondary | ICD-10-CM

## 2012-09-08 DIAGNOSIS — D509 Iron deficiency anemia, unspecified: Secondary | ICD-10-CM

## 2012-09-08 DIAGNOSIS — K219 Gastro-esophageal reflux disease without esophagitis: Secondary | ICD-10-CM

## 2012-09-08 DIAGNOSIS — R1310 Dysphagia, unspecified: Secondary | ICD-10-CM

## 2012-09-08 DIAGNOSIS — N3281 Overactive bladder: Secondary | ICD-10-CM

## 2012-09-08 DIAGNOSIS — I4891 Unspecified atrial fibrillation: Secondary | ICD-10-CM

## 2012-09-09 ENCOUNTER — Encounter: Payer: Self-pay | Admitting: Adult Health

## 2012-09-09 NOTE — Progress Notes (Signed)
Patient ID: Melissa Robinson, female   DOB: Apr 19, 1916, 79 y.o.   MRN: [id number]       PROGRESS NOTE  DATE: 09/08/2012   FACILITY: [facility]  LEVEL OF CARE: SNF (31)   CHIEF COMPLAINT:  Discharge Notes  HISTORY OF PRESENT ILLNESS:  This is a 79 year old female who is for discharge home with home health  PT, OT and nursing. She has been admitted to [facility] on 08/23/12 from [facility] with discharge diagnoses of acute on chronic congestive heart failure with atrial fibrillation. She was also having progressive dysphagia prior to hospitalization and so once she got stabilized in the hospital she had EGD with distal esophagus dilatation. Patient has completed SNF rehabilitation and therapy has cleared the patient for discharge.  Reassessment of ongoing problem(s):  CHF:The patient does not relate significant weight changes, denies sob, DOE, orthopnea, PNDs, pedal edema, palpitations or chest pain.  CHF remains stable.  No complications form the medications being used.  ATRIAL FIBRILLATION: the patients atrial fibrillation remains stable.  The patient denies DOE, tachycardia, orthopnea, transient neurological sx, pedal edema, palpitations, & PNDs.  No complications noted from the medications currently being used.   PAST MEDICAL HISTORY : Reviewed.  No changes.  CURRENT MEDICATIONS: Reviewed per [facility]  REVIEW OF SYSTEMS:  GENERAL: no change in appetite, no fatigue, no weight changes, no fever, chills or weakness RESPIRATORY: no cough, SOB, DOE, wheezing, hemoptysis CARDIAC: no chest pain, edema or palpitations GI: no abdominal pain, diarrhea, constipation, heart burn, nausea or vomiting  PHYSICAL EXAMINATION  VS:  T96.4       P 98       RR 18      BP 110/74            WT 128.2 (Lb)  GENERAL: no acute distress, normal body habitus NECK: supple, trachea midline, no neck masses, no thyroid tenderness, no thyromegaly LYMPHATICS: no LAN in the neck, no  supraclavicular LAN RESPIRATORY: breathing is even & unlabored, BS CTAB CARDIAC:irregularly irregular, no murmur,no extra heart sounds, no edema GI: abdomen soft, normal BS, no masses, no tenderness, no hepatomegaly, no splenomegaly PSYCHIATRIC: the patient is alert & oriented to person, affect & behavior appropriate  LABS/RADIOLOGY: 08/23/12 sodium 139 potassium 2.9 glucose 107 BUN 58 creatinine 2.27 call she'll 9.5 WBC 8.3 hemoglobin 11.1 hematocrit 33.4   ASSESSMENT/PLAN:  Congestive heart failure, chronic - well-compensated  Anemia, iron deficiency - stable  Atrial fibrillation - rate controlled  Chronic renal insufficiency, stage IV - stable  GERD - stable  Hypertension - well controlled  Overactive bladder - stable  Dysphagia S/P EGD with distal esophagus dilatation - no complaints in swallowing   I have filled out patient's discharge paperwork and written prescriptions.  Patient will receive home health PT, OT, ST and Nursing.    Total discharge time: Less than 30 minutes Discharge time involved coordination of the discharge process with Child psychotherapist, nursing staff and therapy department. Medical justification for home health services verified.  He CPT CODE: 40981

## 2012-09-20 ENCOUNTER — Ambulatory Visit (INDEPENDENT_AMBULATORY_CARE_PROVIDER_SITE_OTHER): Payer: Medicare Other | Admitting: Cardiology

## 2012-09-20 ENCOUNTER — Other Ambulatory Visit: Payer: Self-pay | Admitting: *Deleted

## 2012-09-20 ENCOUNTER — Ambulatory Visit: Payer: Medicare Other | Admitting: Pharmacist

## 2012-09-20 ENCOUNTER — Encounter: Payer: Self-pay | Admitting: Cardiology

## 2012-09-20 ENCOUNTER — Ambulatory Visit (INDEPENDENT_AMBULATORY_CARE_PROVIDER_SITE_OTHER): Payer: Medicare Other | Admitting: Pharmacist

## 2012-09-20 VITALS — BP 128/70 | HR 86 | Ht 65.0 in | Wt 125.0 lb

## 2012-09-20 DIAGNOSIS — N189 Chronic kidney disease, unspecified: Secondary | ICD-10-CM

## 2012-09-20 DIAGNOSIS — I5022 Chronic systolic (congestive) heart failure: Secondary | ICD-10-CM

## 2012-09-20 DIAGNOSIS — I4891 Unspecified atrial fibrillation: Secondary | ICD-10-CM

## 2012-09-20 DIAGNOSIS — I509 Heart failure, unspecified: Secondary | ICD-10-CM

## 2012-09-20 LAB — CBC
HCT: 32 % — ABNORMAL LOW (ref 36.0–46.0)
MCHC: 33.5 g/dL (ref 30.0–36.0)
MCV: 93.2 fl (ref 78.0–100.0)
RBC: 3.43 Mil/uL — ABNORMAL LOW (ref 3.87–5.11)

## 2012-09-20 LAB — BASIC METABOLIC PANEL
Calcium: 10.1 mg/dL (ref 8.4–10.5)
GFR: 22.34 mL/min — ABNORMAL LOW (ref >60.00)
Glucose, Bld: 100 mg/dL — ABNORMAL HIGH (ref 70–99)
Potassium: 4 mEq/L (ref 3.5–5.1)
Sodium: 137 mEq/L (ref 135–145)

## 2012-09-20 NOTE — Patient Instructions (Addendum)

## 2012-09-20 NOTE — Telephone Encounter (Signed)
Error

## 2012-09-20 NOTE — Progress Notes (Signed)
Patient ID: Melissa Robinson, female   DOB: [birth year], 79 y.o.   MRN: [id number] PCP: Dr. Clelia Croft  79 yo with chronic systolic CHF, persistent atrial fibrillation, and CKD presents for cardiology followup after recent admission for atrial fibrillation with acute on chronic systolic CHF.  She was admitted in 7/14 with dyspnea and volume overload.  She was noted to be in atrial fibrillation again (had a first episode that was transient in 4/14).  This time, the atrial fibrillation was persistent.  She was started on reduced-dose apixaban and diuresed.  Her renal function worsened a bit.  She lost significant weight and was converted over to po Lasix.  She had been living by herself and is now in [facility] independent living.    She still has significant exertional dyspnea and tires easily.  Her daughter wonders if the process of recently moving into independent living may not have worn her out some.  She can walk round her room without trouble.  However, she gets out of breath walking to the dining room (>100 yards) and has been using a wheelchair to get there.  No orthopnea, PND, or chest pain. She remains in atrial fibrillation today. Weight is up about a pound since last appointment.   Labs (7/14): K 3.9, creatinine 2.27, HCT 33.4 Labs (8/14): K 3.5, creatinine 2.1  PMH: 1. Gout 2. Chronic LBBB 3. H/o OSA 4. Chronic systolic CHF: Cardiomyopathy of uncertain etiology, possibly nonischemic.  Echo (7/14) with EF 35-40%, diffuse hypokinesis, normal RV with mildly decreased RV systolic function, PA systolic pressure 46 mmHg.  5. GERD with recurrent esophageal strictures, last dilatation in 7/14.  6. Atrial fibrillation: Now persistent 7. Colon cancer: s/p lap-colectomy in 5/08.   8. Anemia of chronic disease 9. CKD 10. H/o hip fracture  SH: Lives in [facility] independent living.  Widow, 10 children, nonsmoker.   FH: No premature CAD  ROS: All systems reviewed and negative except as per HPI.    Current Outpatient Prescriptions  Medication Sig Dispense Refill  . apixaban (ELIQUIS) 2.5 MG TABS tablet Take 1 tablet (2.5 mg total) by mouth 2 (two) times daily.  60 tablet  6  . CALCIUM-VITAMIN D PO Take 1 tablet by mouth 2 (two) times daily.      . cholecalciferol (VITAMIN D) 400 UNITS TABS Take 400 Units by mouth daily.      . cyanocobalamin 500 MCG tablet Take 500 mcg by mouth daily.      Marland Kitchen denosumab (PROLIA) 60 MG/ML SOLN injection Inject 60 mg into the skin every 6 (six) months. Administer in upper arm, thigh, or abdomen      . furosemide (LASIX) 40 MG tablet Take 1 tablet (40 mg total) by mouth daily.  30 tablet  6  . hydrALAZINE (APRESOLINE) 25 MG tablet Take 1 tablet (25 mg total) by mouth every 8 (eight) hours.  90 tablet  6  . iron polysaccharides (NIFEREX) 150 MG capsule Take 150 mg by mouth daily.      . isosorbide mononitrate (IMDUR) 30 MG 24 hr tablet Take 1 tablet (30 mg total) by mouth daily.  30 tablet  6  . metoprolol succinate (TOPROL-XL) 50 MG 24 hr tablet Take 1 tablet (50 mg total) by mouth 2 (two) times daily. Take with or immediately following a meal.  30 tablet  6  . mirabegron ER (MYRBETRIQ) 25 MG TB24 Take 25 mg by mouth daily.      . Multiple Vitamin (MULTIVITAMIN WITH MINERALS) TABS  Take 1 tablet by mouth daily.      Marland Kitchen omeprazole (PRILOSEC) 40 MG capsule Tablet twice daily- 30 minutes before breakfast and supper  60 capsule  11  . polyethylene glycol (MIRALAX / GLYCOLAX) packet Take 17 g by mouth daily as needed.  14 each  0  . traMADol (ULTRAM) 50 MG tablet Take 50 mg by mouth every 8 (eight) hours as needed for pain.       No current facility-administered medications for this visit.    BP 128/70  Pulse 86  Ht 5\' 5"  (1.651 m)  Wt 56.7 kg (125 lb)  BMI 20.8 kg/m2  SpO2 95% General: NAD Neck: JVP 8 cm, no thyromegaly or thyroid nodule.  Lungs: Slight crackles at bases bilaterally CV: Nondisplaced PMI.  Heart irregular S1/S2, no S3/S4, no murmur.  No  peripheral edema.  No carotid bruit.  Normal pedal pulses.  Abdomen: Soft, nontender, no hepatosplenomegaly, no distention.  Skin: Intact without lesions or rashes.  Neurologic: Alert and oriented x 3.  Psych: Normal affect. Extremities: No clubbing or cyanosis.   Assessment/Plan: 1. Atrial fibrillation: Now persistent.  Rate-controlled with Toprol XL 50 bid.  Will continue this dose for now.  She is on renally dosed apixaban.  I am going to continue this for now as well (no falls and steady on her feet with walker).  Recent BMET was stable.  She continues to have significant exertional dyspnea and fatigue.  She is not happy with her current functional level.  I think that atrial fibrillation is playing a role in her symptoms.  We had a discussion today about pros and cons of DCCV.  She is going to see how she settles in at her independent living facility (just moved in).  If she continues to have a lot of dyspnea going up and down the halls, she says that she'd like to try DCCV.  She will call back in a week or two to let me know how she feels and whether she wants cardioversion.  I would probably start her on amiodarone in that case to try to keep her in NSR. She will have been on apixaban for > 1 month.   2. Chronic systolic CHF: EF 57-84% on last echo.  No known history of CAD.  Possible LBBB cardiomyopathy.  Minimally volume overloaded today.  NYHA class III symptoms are stable.  Weight is stable. - Continue Toprol XL at current dose.  - No ACEI with CKD, continue hydralazine/Imdur.  - Continue current Lasix dose for now.  - Low sodium diet.  3. CKD: Creatinine stably elevated.    [name] 09/20/2012

## 2012-09-20 NOTE — Patient Instructions (Addendum)
Dr Shirlee Latch has recommended you follow a 1500mg  sodium diet. I have given you an order for this.  I will call you in 2 weeks to see how you are feeling and if you have decided to have the cardioversion. Luana Shu  Your physician recommends that you schedule a follow-up appointment with Dr Shirlee Latch on Thursday September 25,2014 at 2:30PM.

## 2012-09-21 ENCOUNTER — Telehealth: Payer: Self-pay | Admitting: Cardiology

## 2012-09-21 ENCOUNTER — Telehealth: Payer: Self-pay | Admitting: Pharmacist

## 2012-09-21 NOTE — Telephone Encounter (Signed)
LMTCB

## 2012-09-21 NOTE — Telephone Encounter (Signed)
Spoke to patient's daughter - Jake Michaelis. Refer to telephone comments.

## 2012-09-21 NOTE — Telephone Encounter (Signed)
New Problem  Daughter states that the pt would like Dr. Shirlee Latch to write an order for physical therapy  // and fax it to [employer]  @  [email or web address].  Fax # [phone]

## 2012-09-21 NOTE — Telephone Encounter (Signed)
Pt's daughter, Enid Derry states that this has been taken care of and physical therapy has been continued from [facility]. No order needed from Dr Shirlee Latch.

## 2012-09-27 ENCOUNTER — Telehealth: Payer: Self-pay | Admitting: Internal Medicine

## 2012-09-27 ENCOUNTER — Emergency Department (HOSPITAL_COMMUNITY)
Admission: EM | Admit: 2012-09-27 | Discharge: 2012-09-27 | Disposition: A | Discharge status: Home / Self Care | Payer: Medicare Other | Attending: Emergency Medicine | Admitting: Emergency Medicine

## 2012-09-27 ENCOUNTER — Encounter (HOSPITAL_COMMUNITY)
Admission: EM | Disposition: A | Discharge status: Home / Self Care | Payer: Self-pay | Source: Home / Self Care | Attending: Emergency Medicine

## 2012-09-27 ENCOUNTER — Telehealth: Payer: Self-pay | Admitting: Cardiology

## 2012-09-27 ENCOUNTER — Emergency Department (HOSPITAL_COMMUNITY): Payer: Medicare Other

## 2012-09-27 ENCOUNTER — Encounter (HOSPITAL_COMMUNITY): Payer: Self-pay

## 2012-09-27 DIAGNOSIS — T18128A Food in esophagus causing other injury, initial encounter: Secondary | ICD-10-CM

## 2012-09-27 DIAGNOSIS — T18108A Unspecified foreign body in esophagus causing other injury, initial encounter: Secondary | ICD-10-CM | POA: Insufficient documentation

## 2012-09-27 DIAGNOSIS — K224 Dyskinesia of esophagus: Secondary | ICD-10-CM

## 2012-09-27 DIAGNOSIS — I4891 Unspecified atrial fibrillation: Secondary | ICD-10-CM | POA: Insufficient documentation

## 2012-09-27 DIAGNOSIS — IMO0002 Reserved for concepts with insufficient information to code with codable children: Secondary | ICD-10-CM | POA: Insufficient documentation

## 2012-09-27 DIAGNOSIS — Z85038 Personal history of other malignant neoplasm of large intestine: Secondary | ICD-10-CM | POA: Insufficient documentation

## 2012-09-27 DIAGNOSIS — N186 End stage renal disease: Secondary | ICD-10-CM | POA: Insufficient documentation

## 2012-09-27 DIAGNOSIS — R0602 Shortness of breath: Secondary | ICD-10-CM | POA: Insufficient documentation

## 2012-09-27 DIAGNOSIS — I509 Heart failure, unspecified: Secondary | ICD-10-CM | POA: Insufficient documentation

## 2012-09-27 DIAGNOSIS — Z7901 Long term (current) use of anticoagulants: Secondary | ICD-10-CM | POA: Insufficient documentation

## 2012-09-27 DIAGNOSIS — Z79899 Other long term (current) drug therapy: Secondary | ICD-10-CM | POA: Insufficient documentation

## 2012-09-27 HISTORY — PX: ESOPHAGOGASTRODUODENOSCOPY: SHX5428

## 2012-09-27 HISTORY — DX: Dyskinesia of esophagus: K22.4

## 2012-09-27 LAB — CBC WITH DIFFERENTIAL/PLATELET
Basophils Absolute: 0.1 10*3/uL (ref 0.0–0.1)
Basophils Relative: 1 % (ref 0–1)
Hemoglobin: 11.8 g/dL — ABNORMAL LOW (ref 12.0–15.0)
Lymphocytes Relative: 19 % (ref 12–46)
MCHC: 32.5 g/dL (ref 30.0–36.0)
Neutro Abs: 3.3 10*3/uL (ref 1.7–7.7)
Neutrophils Relative %: 67 % (ref 43–77)
RDW: 14 % (ref 11.5–15.5)
WBC: 5 10*3/uL (ref 4.0–10.5)

## 2012-09-27 LAB — COMPREHENSIVE METABOLIC PANEL
ALT: 16 U/L (ref 0–35)
AST: 28 U/L (ref 0–37)
Albumin: 4 g/dL (ref 3.5–5.2)
Alkaline Phosphatase: 52 U/L (ref 39–117)
CO2: 24 mEq/L (ref 19–32)
Chloride: 100 mEq/L (ref 96–112)
Potassium: 3.7 mEq/L (ref 3.5–5.1)
Total Bilirubin: 1.1 mg/dL (ref 0.3–1.2)

## 2012-09-27 LAB — TROPONIN I: Troponin I: <0.3 ng/mL (ref <0.30)

## 2012-09-27 SURGERY — EGD (ESOPHAGOGASTRODUODENOSCOPY)
Anesthesia: Moderate Sedation

## 2012-09-27 MED ORDER — FENTANYL CITRATE 0.05 MG/ML IJ SOLN
INTRAMUSCULAR | Status: AC
  Filled 2012-09-27: qty 2

## 2012-09-27 MED ORDER — BUTAMBEN-TETRACAINE-BENZOCAINE 2-2-14 % EX AERO
INHALATION_SPRAY | CUTANEOUS | Status: DC | PRN
  Administered 2012-09-27: 2 via TOPICAL

## 2012-09-27 MED ORDER — LORAZEPAM 2 MG/ML IJ SOLN
0.5000 mg | Freq: Once | INTRAMUSCULAR | Status: AC
  Administered 2012-09-27: 0.5 mg via INTRAVENOUS
  Filled 2012-09-27: qty 1

## 2012-09-27 MED ORDER — MIDAZOLAM HCL 10 MG/2ML IJ SOLN
INTRAMUSCULAR | Status: AC
  Filled 2012-09-27: qty 2

## 2012-09-27 MED ORDER — FENTANYL CITRATE 0.05 MG/ML IJ SOLN
INTRAMUSCULAR | Status: DC | PRN
  Administered 2012-09-27: 25 ug via INTRAVENOUS

## 2012-09-27 MED ORDER — SODIUM CHLORIDE 0.9 % IV SOLN: INTRAVENOUS | Status: DC

## 2012-09-27 MED ORDER — MIDAZOLAM HCL 10 MG/2ML IJ SOLN
INTRAMUSCULAR | Status: DC | PRN
  Administered 2012-09-27: 2 mg via INTRAVENOUS

## 2012-09-27 NOTE — Consult Note (Signed)
Primary Care Physician:  [name], MD Primary Gastroenterologist:  Dr. [name]  Reason for Consultation:  Dysphagia  HPI: Melissa Robinson is a 79 y.o. female who is known to Dr. [name].  She has PMH of atrial fibrillation on Eloquis, history of colon cancer, CKD, and CHF.  She also has history of dysphagia secondary to distal esophageal stricture as well as esophageal dysmotility.  Her last EGD with dil was 7/28 with a 54 Fr Maloney.    She presented to [facility] ED earlier today with complaints of not being able to swallow her pills or water this AM.  She says that after her last EGD she was feeling well.  Yesterday, she noticed that she had some problem swallowing lunch when she was eating a bowl of cereal and has not eaten since then.  This AM she had a hard time swallowing liquids and her pills so she has not taken them.  She does not feel like she has a food impaction because she has experienced one of those before.  When she takes a sip of water she then belches and brings up frothy material.  Her daughter is here with her in the ED.  I spoke with Dr. Radford Pax who says that she is stable from a CV standpoint at this time.  He says that she was very anxious when she came in so she was given some Ativan.  Her daughter says that this swallowing issue does cause her to have anxiety, and the patient sometimes feels like its hard to tell if her symptoms are GI or cardiac related.  Just saw cardiology last week and they were talking about cardioversion at some point.  Her previous esophagram shows severe dysmotility.   Past Medical History  Diagnosis Date  . Hyperlipemia   . Neuropathy   . Atrial fibrillation     a. Noted during 04/2012 hospitalization.  . Peptic stricture of esophagus     a. Recurrent, s/p previous diltiation. b. Food impaction 04/2012 - had dilitation 05/2012, but dysphagia recurred 07/2012.  Marland Kitchen Hip fracture, right     a. 2012 from a fall.  . Mitral regurgitation     a. Mod by echo  2008.  Marland Kitchen Aortic insufficiency     a. Mild by echo 2008.  Marland Kitchen Urinary incontinence   . Gout   . Arthritis   . Sleep apnea   . Gait instability   . LBBB (left bundle branch block)   . Anemia     Previously reported as IDA in setting of colon CA, then anemia of chronic disease.  . Colon cancer     a. Stage III colon CA s/p R lap-assisted colectomy 05/2006.  Marland Kitchen CHF (congestive heart failure)     a. Echo 2008 in setting of acute illness: EF 35-40%, mod LVH, mild AI, moderate MR, LA mildly dilated, PA pressure .  Marland Kitchen GERD (gastroesophageal reflux disease)   . Hypertension   . CKD (chronic kidney disease)     a. Stage III.  Marland Kitchen Renal cyst, left   . Osteoporosis     a. T10/11/12 compression fracture s/p kyphoplasty (12/10). b. R hip fracture s/p ORIF (1/12)-->Prolia 12/12.    Past Surgical History  Procedure Laterality Date  . Esophagogastroduodenoscopy    . Colonosopy    . Hip fracture surgery    . Esophagogastroduodenoscopy N/A 05/21/2012    Procedure: ESOPHAGOGASTRODUODENOSCOPY (EGD);  Surgeon: Hilarie Fredrickson, MD;  Location: [facility] ENDOSCOPY;  Service: Endoscopy;  Laterality:  N/A;  . Esophagogastroduodenoscopy (egd) with esophageal dilation N/A 08/22/2012    Procedure: ESOPHAGOGASTRODUODENOSCOPY (EGD) WITH ESOPHAGEAL DILATION;  Surgeon: Hilarie Fredrickson, MD;  Location: [facility] ENDOSCOPY;  Service: Endoscopy;  Laterality: N/A;    Prior to Admission medications   Medication Sig Start Date End Date Taking? Authorizing Provider  apixaban (ELIQUIS) 2.5 MG TABS tablet Take 1 tablet (2.5 mg total) by mouth 2 (two) times daily. 08/23/12  Yes [name], MD  CALCIUM-VITAMIN D PO Take 1 tablet by mouth 2 (two) times daily.   Yes Historical Provider, MD  cholecalciferol (VITAMIN D) 400 UNITS TABS Take 400 Units by mouth daily.   Yes Historical Provider, MD  cyanocobalamin 500 MCG tablet Take 500 mcg by mouth daily.   Yes Historical Provider, MD  furosemide (LASIX) 40 MG tablet Take 1 tablet (40 mg total) by  mouth daily. 08/23/12  Yes W Buren Kos, MD  hydrALAZINE (APRESOLINE) 25 MG tablet Take 1 tablet (25 mg total) by mouth every 8 (eight) hours. 08/23/12  Yes [name], MD  iron polysaccharides (NIFEREX) 150 MG capsule Take 150 mg by mouth daily.   Yes Historical Provider, MD  isosorbide mononitrate (IMDUR) 30 MG 24 hr tablet Take 1 tablet (30 mg total) by mouth daily. 08/23/12  Yes [name], MD  metoprolol succinate (TOPROL-XL) 50 MG 24 hr tablet Take 1 tablet (50 mg total) by mouth 2 (two) times daily. Take with or immediately following a meal. 08/23/12  Yes W Buren Kos, MD  mirabegron ER (MYRBETRIQ) 25 MG TB24 Take 25 mg by mouth daily.   Yes Historical Provider, MD  Multiple Vitamin (MULTIVITAMIN WITH MINERALS) TABS Take 1 tablet by mouth daily.   Yes Historical Provider, MD  omeprazole (PRILOSEC) 40 MG capsule Tablet twice daily- 30 minutes before breakfast and supper 05/27/12  Yes Hilarie Fredrickson, MD  polyethylene glycol [facility] / GLYCOLAX) packet Take 17 g by mouth daily as needed. 08/23/12  Yes [name], MD  traMADol (ULTRAM) 50 MG tablet Take 50 mg by mouth every 8 (eight) hours as needed for pain.   Yes Historical Provider, MD  denosumab (PROLIA) 60 MG/ML SOLN injection Inject 60 mg into the skin every 6 (six) months. Administer in upper arm, thigh, or abdomen    Historical Provider, MD    No current facility-administered medications for this encounter.   Current Outpatient Prescriptions  Medication Sig Dispense Refill  . apixaban (ELIQUIS) 2.5 MG TABS tablet Take 1 tablet (2.5 mg total) by mouth 2 (two) times daily.  60 tablet  6  . CALCIUM-VITAMIN D PO Take 1 tablet by mouth 2 (two) times daily.      . cholecalciferol (VITAMIN D) 400 UNITS TABS Take 400 Units by mouth daily.      . cyanocobalamin 500 MCG tablet Take 500 mcg by mouth daily.      . furosemide (LASIX) 40 MG tablet Take 1 tablet (40 mg total) by mouth daily.  30 tablet  6  . hydrALAZINE (APRESOLINE) 25 MG  tablet Take 1 tablet (25 mg total) by mouth every 8 (eight) hours.  90 tablet  6  . iron polysaccharides (NIFEREX) 150 MG capsule Take 150 mg by mouth daily.      . isosorbide mononitrate (IMDUR) 30 MG 24 hr tablet Take 1 tablet (30 mg total) by mouth daily.  30 tablet  6  . metoprolol succinate (TOPROL-XL) 50 MG 24 hr tablet Take 1 tablet (50 mg total) by  mouth 2 (two) times daily. Take with or immediately following a meal.  30 tablet  6  . mirabegron ER (MYRBETRIQ) 25 MG TB24 Take 25 mg by mouth daily.      . Multiple Vitamin (MULTIVITAMIN WITH MINERALS) TABS Take 1 tablet by mouth daily.      Marland Kitchen omeprazole (PRILOSEC) 40 MG capsule Tablet twice daily- 30 minutes before breakfast and supper  60 capsule  11  . polyethylene glycol (MIRALAX / GLYCOLAX) packet Take 17 g by mouth daily as needed.  14 each  0  . traMADol (ULTRAM) 50 MG tablet Take 50 mg by mouth every 8 (eight) hours as needed for pain.      Marland Kitchen denosumab (PROLIA) 60 MG/ML SOLN injection Inject 60 mg into the skin every 6 (six) months. Administer in upper arm, thigh, or abdomen        Allergies as of 09/27/2012  . (No Known Allergies)    Family History  Problem Relation Age of Onset  . Heart disease Mother   . Breast cancer    . Cancer      Head/neck cancer    History   Social History  . Marital Status: Widowed    Spouse Name: N/A    Number of Children: N/A  . Years of Education: N/A   Occupational History  . Not on file.   Social History Main Topics  . Smoking status: Never Smoker   . Smokeless tobacco: Not on file  . Alcohol Use: No  . Drug Use: No  . Sexual Activity: Not on file   Other Topics Concern  . Not on file   Social History Narrative  . No narrative on file    Review of Systems: Ten point ROS is O/W negative except as mentioned in HPI.  Physical Exam: Vital signs in last 24 hours: Temp:  [97.7 F (36.5 C)] 97.7 F (36.5 C) (09/02 1113) Pulse Rate:  [92-107] 92 (09/02 1147) Resp:  [18-29]  29 (09/02 1147) BP: (131-134)/(73-82) 131/82 mmHg (09/02 1147) SpO2:  [98 %-100 %] 99 % (09/02 1147)   General:   Alert, elderly white female in NAD, but slightly anxious. Head:  Normocephalic and atraumatic. Eyes:  Sclera clear, no icterus.  Conjunctiva pink. Ears:  Normal auditory acuity. Mouth:  No deformity or lesions.   Lungs:  Slight crackles at bases B/L. Heart:  Irregularly irregular but rate controlled. Abdomen:  Soft, non-distended.  BS present.  Non-tender.   Rectal:  Deferred  Msk:  Symmetrical without gross deformities. Pulses:  Normal pulses noted. Extremities:  Without clubbing or edema. Neurologic:  Alert and  oriented x4;  grossly normal neurologically. Skin:  Intact without significant lesions or rashes. Psych:  Alert and cooperative. Normal mood and affect.  Lab Results:  Recent Labs  09/27/12 1200  WBC 5.0  HGB 11.8*  HCT 36.3  PLT 181   BMET  Recent Labs  09/27/12 1200  NA 140  K 3.7  CL 100  CO2 24  GLUCOSE 106*  BUN 33*  CREATININE 2.09*  CALCIUM 9.8   LFT  Recent Labs  09/27/12 1200  PROT 6.8  ALBUMIN 4.0  AST 28  ALT 16  ALKPHOS 52  BILITOT 1.1   Studies/Results: Dg Chest 2 View  09/27/2012   *RADIOLOGY REPORT*  Clinical Data: Shortness of breath  CHEST - 2 VIEW  Comparison: 08/18/2012 the  Findings: Cardiomegaly noted with chronic bronchitic changes and interstitial prominence.  No definite CHF or pneumonia.  No large effusion or pneumothorax.  Atherosclerosis of the aorta.  Bones are osteopenic with diffuse degenerative changes of the spine.  Lower thoracic vertebral augmentation changes noted.  Chronic lower thoracic compression deformities.  IMPRESSION: Cardiomegaly with chronic bronchitic and interstitial prominence. No current CHF or pneumonia.   Original Report Authenticated By: Judie Petit. Miles Costain, M.D.    IMPRESSION:  -Dysphagia:  Very likely secondary to her esophageal dysmotility, but has history of stricture as well.  Possible  food impaction. -Atrial fibrillation:  On Eloquis and rate controlled. -CKD -CHF:  Stable.  PLAN: -EGD today to evaluate for food impaction.   ZEHR, JESSICA D.  09/27/2012, 1:56 PM  Pager number [phone]   [facility] Attending  I have also seen and assessed the patient and agree with the above note. Sounds like she may have a food impaction in the setting of esophageal dysmotility. Plan for esophagoscopy and removal of food impaction if present. The risks and benefits as well as alternatives of endoscopic procedure(s) have been discussed and reviewed. All questions answered. The patient agrees to proceed.  Iva Boop, MD, [facility] [phone] (pager) 09/27/2012 4:21 PM

## 2012-09-27 NOTE — Telephone Encounter (Signed)
LMTCB

## 2012-09-27 NOTE — Telephone Encounter (Signed)
New Problem  Melissa Robinson //[facility] [phone] Did admit her for services for phys therapy// nursing and OT// Lyla Son ask's that call her directly for additional information

## 2012-09-27 NOTE — Telephone Encounter (Signed)
Pts daughter states that her mother started having difficulty swallowing yesterday around lunch time. Pt has had her esophagus dilated in the past. States that now she cannot even swallow water or her pills. Instructed daughter to take her to [facility] ER. Daughter verbalized understanding. Spoke with Doug Sou PA and she is aware. Dr. [name] aware.

## 2012-09-27 NOTE — Op Note (Signed)
[facility] [address] [place], [address]   ENDOSCOPY PROCEDURE REPORT  PATIENT: Melissa Robinson, Melissa Robinson  MR#: [id number] BIRTHDATE: [birth year] , 89  yrs. old GENDER: Female ENDOSCOPIST: Iva Boop, MD, [facility] PROCEDURE DATE:  09/27/2012 PROCEDURE:  Esophagoscopy  with foreign body (food impaction) removal ASA CLASS:     Class III INDICATIONS:  Therapeutic procedure.  Remove food impaction MEDICATIONS: Fentanyl 25 mcg IV and Versed 2 mg IV TOPICAL ANESTHETIC: Cetacaine Spray  DESCRIPTION OF PROCEDURE: After the risks benefits and alternatives of the procedure were thoroughly explained, informed consent was obtained.  The    endoscope was introduced through the mouth and advanced to the stomach body. Without limitations.  The instrument was slowly withdrawn as the mucosa was fully examined.       ESOPHAGUS: Food impaction in mid and distal esophagus successfuly removed by gently advancing the scope and irrigating several times. Some stasis changes of the mucosa with slight contact friability. Not much esophageal motility observed. STOMACH: The mucosa of the stomach appeared normal.  Retroflexed views revealed no abnormalities.     The scope was then withdrawn from the patient and the procedure completed.  COMPLICATIONS: There were no complications. ENDOSCOPIC IMPRESSION: Food impaction of the esophagus - successfully removed She has severe esophageal dysmotility - no significant stricture now  RECOMMENDATIONS: Dysphagia 3 diet - instruction given Follow-up with Dr.  [name] as needed   eSigned:  Iva Boop, MD, [facility] 09/27/2012 4:58 PM   XB:JYNW [name], MD, [name], MD, and The Patient

## 2012-09-27 NOTE — ED Notes (Signed)
Pt c/o throat constriction since yesterday, unable to swallow, pt c/o SOB, states that's from her CHF and Afib; pt in no distress, states has had her throat stretch x3 for same

## 2012-09-28 ENCOUNTER — Encounter (HOSPITAL_COMMUNITY): Payer: Self-pay | Admitting: Internal Medicine

## 2012-09-29 NOTE — Telephone Encounter (Signed)
Start amiodarone 200 mg bid and will plan on DCCV in 2 wks.

## 2012-09-29 NOTE — Telephone Encounter (Signed)
I spoke with the pt's daughter Enid Derry and the pt would like to proceed with DCCV at this time.  In reviewing Dr Alford Highland note it looks like he wanted to have the pt start amiodarone if she did want to proceed with DCCV.  I will forward this message to Dr Shirlee Latch for amiodarone instructions and for time frame between starting amiodarone and having DCCV.

## 2012-09-29 NOTE — Telephone Encounter (Signed)
Follow up    Would like to schedule procedure Dr. Shirlee Latch has recommend.

## 2012-09-30 ENCOUNTER — Telehealth: Payer: Self-pay | Admitting: *Deleted

## 2012-09-30 MED ORDER — AMIODARONE HCL 200 MG PO TABS: 200.0000 mg | ORAL_TABLET | Freq: Every day | ORAL | Status: DC

## 2012-09-30 MED ORDER — AMIODARONE HCL 200 MG PO TABS: 200.0000 mg | ORAL_TABLET | Freq: Two times a day (BID) | ORAL | Status: DC

## 2012-09-30 NOTE — Telephone Encounter (Signed)
DCCV scheduled for 10/06/12 10:15AM. LM for daughter, [name] date and time of cardioversion, will follow up with her the first of the week.

## 2012-09-30 NOTE — Telephone Encounter (Signed)
Spoke with patient's daughter, [name]. She is aware pt should start amiodarone 200mg  bid. She is going to check with other family members about a good day for DCCV and call me back.

## 2012-09-30 NOTE — Telephone Encounter (Signed)
Prescription to pharmacy

## 2012-10-03 ENCOUNTER — Encounter: Payer: Self-pay | Admitting: *Deleted

## 2012-10-03 NOTE — Telephone Encounter (Signed)
Follow up  Returning call/// do not call the cell.

## 2012-10-03 NOTE — Telephone Encounter (Signed)
LMTCB for Carol 

## 2012-10-03 NOTE — Telephone Encounter (Signed)
LMTCB

## 2012-10-03 NOTE — Telephone Encounter (Signed)
Follow up  ° ° ° °Returning call back to nurse  °

## 2012-10-03 NOTE — Telephone Encounter (Signed)
Spoke with [name], confirmed TEE time and date, 10/06/12 10:15AM, instructions reviewed.

## 2012-10-03 NOTE — ED Provider Notes (Signed)
CSN: [id number]     Arrival date & time 09/27/12  1111 History   First MD Initiated Contact with Patient 09/27/12 1121     Chief Complaint  Patient presents with  . Shortness of Breath  . Oral Swelling    hx of stretched throat    HPI Pt c/o throat constriction since yesterday, unable to swallow, pt c/o SOB, states that's from her CHF and Afib; pt in no distress, states has had her throat stretch x3 for same  Past Medical History  Diagnosis Date  . Hyperlipemia   . Neuropathy   . Atrial fibrillation     a. Noted during 04/2012 hospitalization.  . Peptic stricture of esophagus     a. Recurrent, s/p previous diltiation. b. Food impaction 04/2012 - had dilitation 05/2012, but dysphagia recurred 07/2012.  Marland Kitchen Hip fracture, right     a. 2012 from a fall.  . Mitral regurgitation     a. Mod by echo 2008.  Marland Kitchen Aortic insufficiency     a. Mild by echo 2008.  Marland Kitchen Urinary incontinence   . Gout   . Arthritis   . Sleep apnea   . Gait instability   . LBBB (left bundle branch block)   . Anemia     Previously reported as IDA in setting of colon CA, then anemia of chronic disease.  . Colon cancer     a. Stage III colon CA s/p R lap-assisted colectomy 05/2006.  Marland Kitchen CHF (congestive heart failure)     a. Echo 2008 in setting of acute illness: EF 35-40%, mod LVH, mild AI, moderate MR, LA mildly dilated, PA pressure .  Marland Kitchen GERD (gastroesophageal reflux disease)   . Hypertension   . CKD (chronic kidney disease)     a. Stage III.  Marland Kitchen Renal cyst, left   . Osteoporosis     a. T10/11/12 compression fracture s/p kyphoplasty (12/10). b. R hip fracture s/p ORIF (1/12)-->Prolia 12/12.  . Esophageal dysmotility 05/21/2012   Past Surgical History  Procedure Laterality Date  . Esophagogastroduodenoscopy    . Colonosopy    . Hip fracture surgery    . Esophagogastroduodenoscopy N/A 05/21/2012    Procedure: ESOPHAGOGASTRODUODENOSCOPY (EGD);  Surgeon: Hilarie Fredrickson, MD;  Location[facility] ENDOSCOPY;  Service: Endoscopy;   Laterality: N/A;  . Esophagogastroduodenoscopy (egd) with esophageal dilation N/A 08/22/2012    Procedure: ESOPHAGOGASTRODUODENOSCOPY (EGD) WITH ESOPHAGEAL DILATION;  Surgeon: Hilarie Fredrickson, MD;  Location: [facility] ENDOSCOPY;  Service: Endoscopy;  Laterality: N/A;  . Esophagogastroduodenoscopy N/A 09/27/2012    Procedure: ESOPHAGOGASTRODUODENOSCOPY (EGD);  Surgeon: Iva Boop, MD;  Location: [facility] ENDOSCOPY;  Service: Endoscopy;  Laterality: N/A;   Family History  Problem Relation Age of Onset  . Heart disease Mother   . Breast cancer    . Cancer      Head/neck cancer   History  Substance Use Topics  . Smoking status: Never Smoker   . Smokeless tobacco: Not on file  . Alcohol Use: No   OB History   Grav Para Term Preterm Abortions TAB SAB Ect Mult Living                 Review of Systems  Unable to perform ROS: Age    Allergies  Review of patient's allergies indicates no known allergies.  Home Medications   Current Outpatient Rx  Name  Route  Sig  Dispense  Refill  . amiodarone (PACERONE) 200 MG tablet   Oral   Take 1 tablet (  200 mg total) by mouth 2 (two) times daily.   60 tablet   3    BP 122/74  Pulse 108  Temp(Src) 98.2 F (36.8 C) (Oral)  Resp 25  SpO2 93% Physical Exam  Nursing note and vitals reviewed. Constitutional: She is oriented to person, place, and time. She appears well-developed and well-nourished. She appears distressed (anxious).  HENT:  Head: Normocephalic and atraumatic.  Able to swalow liquids without complete obstruction.  Partial obstruction may be possibility  Eyes: Pupils are equal, round, and reactive to light.  Neck: Normal range of motion.  Cardiovascular: Normal rate and intact distal pulses.   Pulmonary/Chest: No respiratory distress.  Abdominal: Normal appearance. She exhibits no distension.  Musculoskeletal: Normal range of motion.  Neurological: She is alert and oriented to person, place, and time. No cranial nerve deficit.  Skin:  Skin is warm and dry. No rash noted.  Psychiatric: She has a normal mood and affect. Her behavior is normal.    ED Course  Procedures (including critical care time) Labs Review Labs Reviewed  CBC WITH DIFFERENTIAL - Abnormal; Notable for the following:    Hemoglobin 11.8 (*)    Monocytes Relative 13 (*)    All other components within normal limits  COMPREHENSIVE METABOLIC PANEL - Abnormal; Notable for the following:    Glucose, Bld 106 (*)    BUN 33 (*)    Creatinine, Ser 2.09 (*)    GFR calc non Af Amer 19 (*)    GFR calc Af Amer 22 (*)    All other components within normal limits  PRO B NATRIURETIC PEPTIDE - Abnormal; Notable for the following:    Pro B Natriuretic peptide (BNP) 10669.0 (*)    All other components within normal limits  TROPONIN I   Imaging Review No results found.  GI saw the patient and admitted for evaluation of partial obstruction MDM   1. Esophageal dysmotility   2. Food impaction of esophagus, initial encounter       Nelia Shi, MD 10/03/12 1057

## 2012-10-05 ENCOUNTER — Telehealth: Payer: Self-pay | Admitting: Cardiology

## 2012-10-05 NOTE — Telephone Encounter (Signed)
Pt's daughter called back , she said pt is having a Cardioversion tomorrow with Dr. Shirlee Latch; Pt  started taking Amiodarone 200 mg twice a day on 09/30/12. pt is having side effects from this medication; pt is nauseas, very weak has not eating much, she does not feel well  . Daughter states she has to stay with pt  today. Will send message to Dr. Shirlee Latch for recommendations.

## 2012-10-05 NOTE — Telephone Encounter (Signed)
Need to discuss new med.  Patient is having problems with sleeping and not feeling well.  Please call [phone].

## 2012-10-05 NOTE — Telephone Encounter (Signed)
Cut to 100 mg once daily and see if she can tolerate this, otherwise will stop it.  Nausea can be a side effect.

## 2012-10-05 NOTE — Telephone Encounter (Signed)
Pt's daughter aware of MD's recommendations. Pt is to take Amiodarone 100 mg once daily, and let the office  know how she does. Pt's daughter verbalized understanding.

## 2012-10-05 NOTE — Telephone Encounter (Signed)
Left pt a message to call back. 

## 2012-10-06 ENCOUNTER — Encounter (HOSPITAL_COMMUNITY)
Admission: EM | Disposition: A | Discharge status: Home / Self Care | Payer: Self-pay | Source: Home / Self Care | Attending: Emergency Medicine

## 2012-10-06 ENCOUNTER — Encounter (HOSPITAL_COMMUNITY): Payer: Self-pay | Admitting: Certified Registered Nurse Anesthetist

## 2012-10-06 ENCOUNTER — Ambulatory Visit (HOSPITAL_COMMUNITY)
Admission: RE | Admit: 2012-10-06 | Discharge: 2012-10-06 | Disposition: A | Discharge status: Home / Self Care | Payer: Medicare Other | Source: Ambulatory Visit | Attending: Cardiology | Admitting: Cardiology

## 2012-10-06 ENCOUNTER — Ambulatory Visit (HOSPITAL_COMMUNITY): Admission: RE | Admit: 2012-10-06 | Payer: Medicare Other | Source: Ambulatory Visit | Admitting: Cardiology

## 2012-10-06 ENCOUNTER — Ambulatory Visit (HOSPITAL_COMMUNITY): Payer: Medicare Other | Admitting: Certified Registered Nurse Anesthetist

## 2012-10-06 ENCOUNTER — Encounter (HOSPITAL_COMMUNITY): Payer: Self-pay

## 2012-10-06 ENCOUNTER — Encounter (HOSPITAL_COMMUNITY)
Admission: RE | Disposition: A | Discharge status: Home / Self Care | Payer: Self-pay | Source: Ambulatory Visit | Attending: Cardiology

## 2012-10-06 DIAGNOSIS — I491 Atrial premature depolarization: Secondary | ICD-10-CM | POA: Insufficient documentation

## 2012-10-06 DIAGNOSIS — I4891 Unspecified atrial fibrillation: Secondary | ICD-10-CM

## 2012-10-06 DIAGNOSIS — D638 Anemia in other chronic diseases classified elsewhere: Secondary | ICD-10-CM | POA: Insufficient documentation

## 2012-10-06 DIAGNOSIS — K219 Gastro-esophageal reflux disease without esophagitis: Secondary | ICD-10-CM | POA: Insufficient documentation

## 2012-10-06 DIAGNOSIS — I509 Heart failure, unspecified: Secondary | ICD-10-CM | POA: Insufficient documentation

## 2012-10-06 DIAGNOSIS — I5022 Chronic systolic (congestive) heart failure: Secondary | ICD-10-CM | POA: Insufficient documentation

## 2012-10-06 DIAGNOSIS — I447 Left bundle-branch block, unspecified: Secondary | ICD-10-CM | POA: Insufficient documentation

## 2012-10-06 DIAGNOSIS — N189 Chronic kidney disease, unspecified: Secondary | ICD-10-CM | POA: Insufficient documentation

## 2012-10-06 HISTORY — PX: CARDIOVERSION: SHX1299

## 2012-10-06 SURGERY — CARDIOVERSION
Anesthesia: Monitor Anesthesia Care

## 2012-10-06 MED ORDER — SODIUM CHLORIDE 0.9 % IJ SOLN: 3.0000 mL | Freq: Two times a day (BID) | INTRAMUSCULAR | Status: DC

## 2012-10-06 MED ORDER — SODIUM CHLORIDE 0.9 % IV SOLN: 250.0000 mL | INTRAVENOUS | Status: DC

## 2012-10-06 MED ORDER — SODIUM CHLORIDE 0.9 % IJ SOLN: 3.0000 mL | INTRAMUSCULAR | Status: DC | PRN

## 2012-10-06 MED ORDER — PROPOFOL 10 MG/ML IV BOLUS
INTRAVENOUS | Status: DC | PRN
  Administered 2012-10-06: 45 mg via INTRAVENOUS

## 2012-10-06 MED ORDER — LIDOCAINE HCL (CARDIAC) 20 MG/ML IV SOLN
INTRAVENOUS | Status: DC | PRN
  Administered 2012-10-06: 45 mg via INTRAVENOUS

## 2012-10-06 MED ORDER — SODIUM CHLORIDE 0.9 % IV SOLN
INTRAVENOUS | Status: DC
  Administered 2012-10-06: 10:00:00 via INTRAVENOUS

## 2012-10-06 MED ORDER — AMIODARONE HCL 200 MG PO TABS: 100.0000 mg | ORAL_TABLET | Freq: Every day | ORAL | Status: DC

## 2012-10-06 MED ORDER — METOPROLOL SUCCINATE ER 50 MG PO TB24: 50.0000 mg | ORAL_TABLET | Freq: Every day | ORAL | Status: DC

## 2012-10-06 NOTE — Transfer of Care (Signed)
Immediate Anesthesia Transfer of Care Note  Patient: Melissa Robinson  Procedure(s) Performed: Procedure(s): CARDIOVERSION (N/A)  Patient Location: PACU  Anesthesia Type:MAC  Level of Consciousness: awake, alert  and patient cooperative  Airway & Oxygen Therapy: Patient Spontanous Breathing and Patient connected to nasal cannula oxygen  Post-op Assessment: Report given to PACU RN, Post -op Vital signs reviewed and stable and Patient moving all extremities X 4  Post vital signs: Reviewed and stable  Complications: No apparent anesthesia complications

## 2012-10-06 NOTE — Procedures (Signed)
Electrical Cardioversion Procedure Note Melissa Robinson [id number] 11-08-1916  Procedure: Electrical Cardioversion Indications:  Atrial Fibrillation. Patient had been on apixaban for > 1 month prior to procedure.  Since going into atrial fibrillation, she has had considerably increased dyspnea and wanted to try to see if regaining NSR would help symptoms.   Procedure Details Consent: Risks of procedure as well as the alternatives and risks of each were explained to the (patient/caregiver).  Consent for procedure obtained. Time Out: Verified patient identification, verified procedure, site/side was marked, verified correct patient position, special equipment/implants available, medications/allergies/relevent history reviewed, required imaging and test results available.  Performed  Patient placed on cardiac monitor, pulse oximetry, supplemental oxygen as necessary.  Sedation given: Propofol 45 mg IV per anesthesiology.  Pacer pads placed anterior and posterior chest.  Cardioverted 1 time(s).  Cardioverted at 200J.  Evaluation Findings: Post procedure EKG shows: NSR with frequent PACs.  Complications: None Patient did tolerate procedure well.   [name] 10/06/2012, 10:25 AM

## 2012-10-06 NOTE — Preoperative (Signed)
Beta Blockers   Reason not to administer Beta Blockers:Not Applicable, took metoprolol today. 

## 2012-10-06 NOTE — H&P (View-Only) (Signed)
Patient ID: Melissa Robinson, female   DOB: 01/29/1916, 79 y.o.   MRN: [id number] PCP: Dr. Shaw  79 yo with chronic systolic CHF, persistent atrial fibrillation, and CKD presents for cardiology followup after recent admission for atrial fibrillation with acute on chronic systolic CHF.  She was admitted in 7/14 with dyspnea and volume overload.  She was noted to be in atrial fibrillation again (had a first episode that was transient in 4/14).  This time, the atrial fibrillation was persistent.  She was started on reduced-dose apixaban and diuresed.  Her renal function worsened a bit.  She lost significant weight and was converted over to po Lasix.  She had been living by herself and is now in [facility] independent [facility].    She still has significant exertional dyspnea and tires easily.  Her daughter wonders if the process of recently moving into independent living may not have worn her out some.  She can walk round her room without trouble.  However, she gets out of breath walking to the dining room (>100 yards) and has been using a wheelchair to get there.  No orthopnea, PND, or chest pain. She remains in atrial fibrillation today. Weight is up about a pound since last appointment.   Labs (7/14): K 3.9, creatinine 2.27, HCT 33.4 Labs (8/14): K 3.5, creatinine 2.1  PMH: 1. Gout 2. Chronic LBBB 3. H/o OSA 4. Chronic systolic CHF: Cardiomyopathy of uncertain etiology, possibly nonischemic.  Echo (7/14) with EF 35-40%, diffuse hypokinesis, normal RV with mildly decreased RV systolic function, PA systolic pressure 46 mmHg.  5. GERD with recurrent esophageal strictures, last dilatation in 7/14.  6. Atrial fibrillation: Now persistent 7. Colon cancer: s/p lap-colectomy in 5/08.   8. Anemia of chronic disease 9. CKD 10. H/o hip fracture  SH: Lives in [facility] independent [facility].  Widow, 10 children, nonsmoker.   FH: No premature CAD  ROS: All systems reviewed and negative except as per HPI.    Current Outpatient Prescriptions  Medication Sig Dispense Refill  . apixaban (ELIQUIS) 2.5 MG TABS tablet Take 1 tablet (2.5 mg total) by mouth 2 (two) times daily.  60 tablet  6  . CALCIUM-VITAMIN D PO Take 1 tablet by mouth 2 (two) times daily.      . cholecalciferol (VITAMIN D) 400 UNITS TABS Take 400 Units by mouth daily.      . cyanocobalamin 500 MCG tablet Take 500 mcg by mouth daily.      . denosumab (PROLIA) 60 MG/ML SOLN injection Inject 60 mg into the skin every 6 (six) months. Administer in upper arm, thigh, or abdomen      . furosemide (LASIX) 40 MG tablet Take 1 tablet (40 mg total) by mouth daily.  30 tablet  6  . hydrALAZINE (APRESOLINE) 25 MG tablet Take 1 tablet (25 mg total) by mouth every 8 (eight) hours.  90 tablet  6  . iron polysaccharides (NIFEREX) 150 MG capsule Take 150 mg by mouth daily.      . isosorbide mononitrate (IMDUR) 30 MG 24 hr tablet Take 1 tablet (30 mg total) by mouth daily.  30 tablet  6  . metoprolol succinate (TOPROL-XL) 50 MG 24 hr tablet Take 1 tablet (50 mg total) by mouth 2 (two) times daily. Take with or immediately following a meal.  30 tablet  6  . mirabegron ER (MYRBETRIQ) 25 MG TB24 Take 25 mg by mouth daily.      . Multiple Vitamin (MULTIVITAMIN WITH MINERALS) TABS   Take 1 tablet by mouth daily.      . omeprazole (PRILOSEC) 40 MG capsule Tablet twice daily- 30 minutes before breakfast and supper  60 capsule  11  . polyethylene glycol (MIRALAX / GLYCOLAX) packet Take 17 g by mouth daily as needed.  14 each  0  . traMADol (ULTRAM) 50 MG tablet Take 50 mg by mouth every 8 (eight) hours as needed for pain.       No current facility-administered medications for this visit.    BP 128/70  Pulse 86  Ht 5' 5" (1.651 m)  Wt 56.7 kg (125 lb)  BMI 20.8 kg/m2  SpO2 95% General: NAD Neck: JVP 8 cm, no thyromegaly or thyroid nodule.  Lungs: Slight crackles at bases bilaterally CV: Nondisplaced PMI.  Heart irregular S1/S2, no S3/S4, no murmur.  No  peripheral edema.  No carotid bruit.  Normal pedal pulses.  Abdomen: Soft, nontender, no hepatosplenomegaly, no distention.  Skin: Intact without lesions or rashes.  Neurologic: Alert and oriented x 3.  Psych: Normal affect. Extremities: No clubbing or cyanosis.   Assessment/Plan: 1. Atrial fibrillation: Now persistent.  Rate-controlled with Toprol XL 50 bid.  Will continue this dose for now.  She is on renally dosed apixaban.  I am going to continue this for now as well (no falls and steady on her feet with walker).  Recent BMET was stable.  She continues to have significant exertional dyspnea and fatigue.  She is not happy with her current functional level.  I think that atrial fibrillation is playing a role in her symptoms.  We had a discussion today about pros and cons of DCCV.  She is going to see how she settles in at her independent living facility (just moved in).  If she continues to have a lot of dyspnea going up and down the halls, she says that she'd like to try DCCV.  She will call back in a week or two to let me know how she feels and whether she wants cardioversion.  I would probably start her on amiodarone in that case to try to keep her in NSR. She will have been on apixaban for > 1 month.   2. Chronic systolic CHF: EF 35-40% on last echo.  No known history of CAD.  Possible LBBB cardiomyopathy.  Minimally volume overloaded today.  NYHA class III symptoms are stable.  Weight is stable. - Continue Toprol XL at current dose.  - No ACEI with CKD, continue hydralazine/Imdur.  - Continue current Lasix dose for now.  - Low sodium diet.  3. CKD: Creatinine stably elevated.    Jibril Mcminn 09/20/2012    

## 2012-10-06 NOTE — Anesthesia Preprocedure Evaluation (Addendum)
Anesthesia Evaluation  Patient identified by MRN, date of birth, ID band Patient awake    Reviewed: Allergy & Precautions, H&P , NPO status , Patient's Chart, lab work & pertinent test results, reviewed documented beta blocker date and time   History of Anesthesia Complications Negative for: history of anesthetic complications  Airway Mallampati: I TM Distance: >3 FB Neck ROM: Full    Dental  (+) Edentulous Upper and Dental Advisory Given   Pulmonary sleep apnea ,  breath sounds clear to auscultation  Pulmonary exam normal       Cardiovascular hypertension, Pt. on medications and Pt. on home beta blockers +CHF + dysrhythmias Atrial Fibrillation Rhythm:Irregular Rate:Normal  EF 35%   Neuro/Psych    GI/Hepatic GERD-  Medicated and Controlled,  Endo/Other  negative endocrine ROS  Renal/GU CRFRenal diseaseCRF Stage IV. Makes urine, No hx of HD.     Musculoskeletal   Abdominal   Peds  Hematology   Anesthesia Other Findings   Reproductive/Obstetrics                          Anesthesia Physical Anesthesia Plan  ASA: III  Anesthesia Plan: General   Post-op Pain Management:    Induction: Intravenous  Airway Management Planned: Mask  Additional Equipment:   Intra-op Plan:   Post-operative Plan:   Informed Consent:   Plan Discussed with: CRNA and Surgeon  Anesthesia Plan Comments:         Anesthesia Quick Evaluation

## 2012-10-06 NOTE — Interval H&P Note (Signed)
History and Physical Interval Note:  10/06/2012 10:21 AM  Melissa Robinson  has presented today for surgery, with the diagnosis of afib  The various methods of treatment have been discussed with the patient and family. After consideration of risks, benefits and other options for treatment, the patient has consented to  Procedure(s): CARDIOVERSION (N/A) as a surgical intervention .  The patient's history has been reviewed, patient examined, no change in status, stable for surgery.  I have reviewed the patient's chart and labs.  Questions were answered to the patient's satisfaction.    Patient has been taking apixaban without missing a dose for > 1 month.    Melissa Robinson [employer]

## 2012-10-06 NOTE — Anesthesia Procedure Notes (Signed)
Procedure Name: MAC Date/Time: 10/06/2012 1:10 AM Performed by: Angelica Pou Pre-anesthesia Checklist: Patient identified, Emergency Drugs available, Suction available, Patient being monitored and Timeout performed Patient Re-evaluated:Patient Re-evaluated prior to inductionOxygen Delivery Method: Ambu bag Preoxygenation: Pre-oxygenation with 100% oxygen Intubation Type: IV induction Ventilation: Mask ventilation without difficulty

## 2012-10-06 NOTE — Anesthesia Postprocedure Evaluation (Signed)
  Anesthesia Post-op Note  Patient: Melissa Robinson  Procedure(s) Performed: Procedure(s): CARDIOVERSION (N/A)  Patient Location: PACU and Endoscopy Unit  Anesthesia Type:General  Level of Consciousness: awake  Airway and Oxygen Therapy: Patient Spontanous Breathing  Post-op Pain: none  Post-op Assessment: Post-op Vital signs reviewed  Post-op Vital Signs: stable  Complications: No apparent anesthesia complications

## 2012-10-06 NOTE — Progress Notes (Signed)
Kept patient an extra hour in recovery per Dr. Traci Sermon request

## 2012-10-07 ENCOUNTER — Encounter (HOSPITAL_COMMUNITY): Payer: Self-pay | Admitting: Cardiology

## 2012-10-20 ENCOUNTER — Encounter: Payer: Self-pay | Admitting: Cardiology

## 2012-10-20 ENCOUNTER — Ambulatory Visit (INDEPENDENT_AMBULATORY_CARE_PROVIDER_SITE_OTHER): Payer: Medicare Other | Admitting: Cardiology

## 2012-10-20 VITALS — BP 140/88 | HR 46 | Ht 65.0 in | Wt 122.0 lb

## 2012-10-20 DIAGNOSIS — N189 Chronic kidney disease, unspecified: Secondary | ICD-10-CM

## 2012-10-20 DIAGNOSIS — I5022 Chronic systolic (congestive) heart failure: Secondary | ICD-10-CM

## 2012-10-20 DIAGNOSIS — I4891 Unspecified atrial fibrillation: Secondary | ICD-10-CM

## 2012-10-20 DIAGNOSIS — I509 Heart failure, unspecified: Secondary | ICD-10-CM

## 2012-10-20 DIAGNOSIS — Z79899 Other long term (current) drug therapy: Secondary | ICD-10-CM

## 2012-10-20 LAB — BASIC METABOLIC PANEL
BUN: 32 mg/dL — ABNORMAL HIGH (ref 6–23)
Calcium: 9.8 mg/dL (ref 8.4–10.5)
GFR: 22.45 mL/min — ABNORMAL LOW (ref >60.00)
Glucose, Bld: 107 mg/dL — ABNORMAL HIGH (ref 70–99)
Potassium: 4.3 mEq/L (ref 3.5–5.1)

## 2012-10-20 LAB — HEPATIC FUNCTION PANEL
ALT: 20 U/L (ref 0–35)
AST: 31 U/L (ref 0–37)
Alkaline Phosphatase: 45 U/L (ref 39–117)
Bilirubin, Direct: 0.1 mg/dL (ref 0.0–0.3)
Total Bilirubin: 0.8 mg/dL (ref 0.3–1.2)

## 2012-10-20 LAB — CBC WITH DIFFERENTIAL/PLATELET
Basophils Absolute: 0 10*3/uL (ref 0.0–0.1)
HCT: 35.4 % — ABNORMAL LOW (ref 36.0–46.0)
Lymphs Abs: 1.2 10*3/uL (ref 0.7–4.0)
Monocytes Relative: 11.4 % (ref 3.0–12.0)
Platelets: 161 10*3/uL (ref 150.0–400.0)
RDW: 15.6 % — ABNORMAL HIGH (ref 11.5–14.6)

## 2012-10-20 MED ORDER — APIXABAN 2.5 MG PO TABS: 2.5000 mg | ORAL_TABLET | Freq: Two times a day (BID) | ORAL | Status: AC

## 2012-10-20 MED ORDER — AMIODARONE HCL 200 MG PO TABS: 100.0000 mg | ORAL_TABLET | Freq: Every day | ORAL | Status: DC

## 2012-10-20 NOTE — Patient Instructions (Addendum)
You will need lab work today: cbc, bmp, tsh, lft We will call you with your results  Your physician recommends that you continue on your current medications as directed. Please refer to the Current Medication list given to you today.   Your physician recommends that you schedule a follow-up appointment in: 3 months with Dr. Shirlee Latch

## 2012-10-22 NOTE — Progress Notes (Signed)
Patient ID: Melissa Robinson, female   DOB: 1917/01/05, 79 y.o.   MRN: [id number] PCP: Dr. Clelia Croft  79 yo with chronic systolic CHF, persistent atrial fibrillation, and CKD presents for cardiology followup after recent admission for atrial fibrillation with acute on chronic systolic CHF.  She was admitted in 7/14 with dyspnea and volume overload.  She was noted to be in atrial fibrillation again (had a first episode that was transient in 4/14).  This time, the atrial fibrillation was persistent.  She was started on reduced-dose apixaban and diuresed.  Her renal function worsened a bit.  She lost significant weight and was converted over to po Lasix.  She had been living by herself and is now in [facility] independent [facility].  Earlier this month, I cardioverted her to NSR.  She remains in NSR today.   Since cardioversion, she does appear to be doing a bit better.  She is still fatigued/weak, but is now able to walk up the hall to the dining room with her walker without much trouble.  Weight is down 3 lbs.  She is not having any more nausea now that amiodarone has been decreased to 100 mg daily.    ECG: NSR, IVCD, PACs  Labs (7/14): K 3.9, creatinine 2.27, HCT 33.4 Labs (8/14): K 3.5, creatinine 2.1 Labs (9/14): K 3.7, creatinine 2.09, BNP 10669  PMH: 1. Gout 2. Chronic LBBB 3. H/o OSA 4. Chronic systolic CHF: Cardiomyopathy of uncertain etiology, possibly nonischemic.  Echo (7/14) with EF 35-40%, diffuse hypokinesis, normal RV with mildly decreased RV systolic function, PA systolic pressure 46 mmHg.  5. GERD with recurrent esophageal strictures, last dilatation in 7/14.  6. Atrial fibrillation: Paroxysmal.  DCCV to NSR in 9/14, on Eliquis and amiodarone.  7. Colon cancer: s/p lap-colectomy in 5/08.   8. Anemia of chronic disease 9. CKD 10. H/o hip fracture  SH: Lives in [facility] independent [facility].  Widow, 10 children, nonsmoker.   FH: No premature CAD  ROS: All systems reviewed and negative  except as per HPI.   Current Outpatient Prescriptions  Medication Sig Dispense Refill  . amiodarone (PACERONE) 200 MG tablet Take 0.5 tablets (100 mg total) by mouth daily.  45 tablet  3  . apixaban (ELIQUIS) 2.5 MG TABS tablet Take 1 tablet (2.5 mg total) by mouth 2 (two) times daily.  180 tablet  3  . CALCIUM-VITAMIN D PO Take 1 tablet by mouth 2 (two) times daily.      . cholecalciferol (VITAMIN D) 400 UNITS TABS Take 400 Units by mouth daily.      . cyanocobalamin 500 MCG tablet Take 500 mcg by mouth daily.      Marland Kitchen denosumab (PROLIA) 60 MG/ML SOLN injection Inject 60 mg into the skin every 6 (six) months. Administer in upper arm, thigh, or abdomen      . furosemide (LASIX) 40 MG tablet Take 1 tablet (40 mg total) by mouth daily.  30 tablet  6  . hydrALAZINE (APRESOLINE) 25 MG tablet Take 1 tablet (25 mg total) by mouth every 8 (eight) hours.  90 tablet  6  . iron polysaccharides (NIFEREX) 150 MG capsule Take 150 mg by mouth daily.      . isosorbide mononitrate (IMDUR) 30 MG 24 hr tablet Take 1 tablet (30 mg total) by mouth daily.  30 tablet  6  . metoprolol succinate (TOPROL-XL) 50 MG 24 hr tablet Take 1 tablet (50 mg total) by mouth daily. Take with or immediately following a  meal.  30 tablet  6  . mirabegron ER (MYRBETRIQ) 25 MG TB24 Take 25 mg by mouth daily.      . Multiple Vitamin (MULTIVITAMIN WITH MINERALS) TABS Take 1 tablet by mouth daily.      Marland Kitchen omeprazole (PRILOSEC) 40 MG capsule Tablet twice daily- 30 minutes before breakfast and supper  60 capsule  11  . polyethylene glycol (MIRALAX / GLYCOLAX) packet Take 17 g by mouth daily as needed.  14 each  0  . traMADol (ULTRAM) 50 MG tablet Take 50 mg by mouth every 8 (eight) hours as needed for pain.       No current facility-administered medications for this visit.    BP 140/88  Pulse 46  Ht 5\' 5"  (1.651 m)  Wt 55.339 kg (122 lb)  BMI 20.3 kg/m2 General: NAD Neck: JVP 7-8 cm, no thyromegaly or thyroid nodule.  Lungs: Slight  crackles at bases bilaterally CV: Nondisplaced PMI.  Heart mildly irregular S1/S2, no S3/S4, no murmur.  No peripheral edema.  No carotid bruit.  Normal pedal pulses.  Abdomen: Soft, nontender, no hepatosplenomegaly, no distention.  Skin: Intact without lesions or rashes.  Neurologic: Alert and oriented x 3.  Psych: Normal affect. Extremities: No clubbing or cyanosis.   Assessment/Plan: 1. Atrial fibrillation: NSR today after DCCV.  She is tolerating amiodarone at lower dose without nausea.  - Continue Eliquis at reduced dose, CBC and BMET today.  - Continue amiodarone at 100 mg daily.  Will check TSH and LFTs today.  She will need a yearly eye exam.  2. Chronic systolic CHF: EF 21-30% on last echo.  No known history of CAD.  Possible LBBB cardiomyopathy.  She does not appear volume overloaded today.  Still NYHA class III symptoms but improved since cardioversion.  - Continue Toprol XL at current dose.  - No ACEI with CKD, continue hydralazine/Imdur.  - Continue current Lasix dose for now.  - Low sodium diet.  3. CKD: Creatinine stably elevated.  BMET today.   [name] 10/22/2012

## 2012-10-27 ENCOUNTER — Emergency Department (HOSPITAL_COMMUNITY): Payer: Medicare Other

## 2012-10-27 ENCOUNTER — Encounter (HOSPITAL_COMMUNITY): Payer: Self-pay | Admitting: *Deleted

## 2012-10-27 ENCOUNTER — Emergency Department (HOSPITAL_COMMUNITY)
Admission: EM | Admit: 2012-10-27 | Discharge: 2012-10-27 | Disposition: A | Discharge status: Home / Self Care | Payer: Medicare Other | Attending: Emergency Medicine | Admitting: Emergency Medicine

## 2012-10-27 ENCOUNTER — Other Ambulatory Visit: Payer: Self-pay | Admitting: *Deleted

## 2012-10-27 DIAGNOSIS — R0602 Shortness of breath: Secondary | ICD-10-CM | POA: Insufficient documentation

## 2012-10-27 DIAGNOSIS — Z862 Personal history of diseases of the blood and blood-forming organs and certain disorders involving the immune mechanism: Secondary | ICD-10-CM | POA: Insufficient documentation

## 2012-10-27 DIAGNOSIS — Q619 Cystic kidney disease, unspecified: Secondary | ICD-10-CM | POA: Insufficient documentation

## 2012-10-27 DIAGNOSIS — W19XXXA Unspecified fall, initial encounter: Secondary | ICD-10-CM

## 2012-10-27 DIAGNOSIS — K219 Gastro-esophageal reflux disease without esophagitis: Secondary | ICD-10-CM | POA: Insufficient documentation

## 2012-10-27 DIAGNOSIS — Z8669 Personal history of other diseases of the nervous system and sense organs: Secondary | ICD-10-CM | POA: Insufficient documentation

## 2012-10-27 DIAGNOSIS — Z8781 Personal history of (healed) traumatic fracture: Secondary | ICD-10-CM | POA: Insufficient documentation

## 2012-10-27 DIAGNOSIS — I509 Heart failure, unspecified: Secondary | ICD-10-CM | POA: Insufficient documentation

## 2012-10-27 DIAGNOSIS — N183 Chronic kidney disease, stage 3 unspecified: Secondary | ICD-10-CM | POA: Insufficient documentation

## 2012-10-27 DIAGNOSIS — M810 Age-related osteoporosis without current pathological fracture: Secondary | ICD-10-CM | POA: Insufficient documentation

## 2012-10-27 DIAGNOSIS — I129 Hypertensive chronic kidney disease with stage 1 through stage 4 chronic kidney disease, or unspecified chronic kidney disease: Secondary | ICD-10-CM | POA: Insufficient documentation

## 2012-10-27 DIAGNOSIS — I4891 Unspecified atrial fibrillation: Secondary | ICD-10-CM | POA: Insufficient documentation

## 2012-10-27 DIAGNOSIS — Z8739 Personal history of other diseases of the musculoskeletal system and connective tissue: Secondary | ICD-10-CM | POA: Insufficient documentation

## 2012-10-27 DIAGNOSIS — Z043 Encounter for examination and observation following other accident: Secondary | ICD-10-CM | POA: Insufficient documentation

## 2012-10-27 DIAGNOSIS — R5381 Other malaise: Secondary | ICD-10-CM | POA: Insufficient documentation

## 2012-10-27 DIAGNOSIS — Z85038 Personal history of other malignant neoplasm of large intestine: Secondary | ICD-10-CM | POA: Insufficient documentation

## 2012-10-27 DIAGNOSIS — R55 Syncope and collapse: Secondary | ICD-10-CM

## 2012-10-27 DIAGNOSIS — D649 Anemia, unspecified: Secondary | ICD-10-CM | POA: Insufficient documentation

## 2012-10-27 DIAGNOSIS — Z7902 Long term (current) use of antithrombotics/antiplatelets: Secondary | ICD-10-CM | POA: Insufficient documentation

## 2012-10-27 DIAGNOSIS — R42 Dizziness and giddiness: Secondary | ICD-10-CM

## 2012-10-27 DIAGNOSIS — Z8639 Personal history of other endocrine, nutritional and metabolic disease: Secondary | ICD-10-CM | POA: Insufficient documentation

## 2012-10-27 LAB — CBC WITH DIFFERENTIAL/PLATELET
Basophils Absolute: 0 10*3/uL (ref 0.0–0.1)
HCT: 36.2 % (ref 36.0–46.0)
Lymphocytes Relative: 18 % (ref 12–46)
Monocytes Absolute: 0.4 10*3/uL (ref 0.1–1.0)
Neutro Abs: 3.9 10*3/uL (ref 1.7–7.7)
Platelets: 131 10*3/uL — ABNORMAL LOW (ref 150–400)
RDW: 14.1 % (ref 11.5–15.5)
WBC: 5.4 10*3/uL (ref 4.0–10.5)

## 2012-10-27 LAB — BASIC METABOLIC PANEL
CO2: 25 mEq/L (ref 19–32)
Chloride: 104 mEq/L (ref 96–112)
Potassium: 4.3 mEq/L (ref 3.5–5.1)
Sodium: 140 mEq/L (ref 135–145)

## 2012-10-27 LAB — GLUCOSE, CAPILLARY: Glucose-Capillary: 99 mg/dL (ref 70–99)

## 2012-10-27 LAB — APTT: aPTT: 32 seconds (ref 24–37)

## 2012-10-27 NOTE — ED Notes (Signed)
PT lives alone and reports falling this AM. Pt denies LOC . Pt also reports feeling dizzy for months and has been seen multiple time . Unsure of the cause of dizziness.On arrival to ED PT reported [facility].

## 2012-10-27 NOTE — ED Notes (Signed)
Notified of [facility]

## 2012-10-27 NOTE — ED Provider Notes (Signed)
CSN: [id number]     Arrival date & time 10/27/12  4098 History   First MD Initiated Contact with Patient 10/27/12 1006     Chief Complaint  Patient presents with  . Fall   (Consider location/radiation/quality/duration/timing/severity/associated sxs/prior Treatment) Patient is a 79 y.o. female presenting with fall. The history is provided by the patient. No language interpreter was used.  Fall This is a new problem. The current episode started today. Associated symptoms include fatigue. Pertinent negatives include no abdominal pain, arthralgias, chest pain, coughing, fever, headaches, nausea, neck pain, numbness, visual change, vomiting or weakness. Associated symptoms comments: dizziness. The symptoms are aggravated by standing. She has tried nothing for the symptoms.    Past Medical History  Diagnosis Date  . Hyperlipemia   . Neuropathy   . Atrial fibrillation     a. Noted during 04/2012 hospitalization.  . Peptic stricture of esophagus     a. Recurrent, s/p previous diltiation. b. Food impaction 04/2012 - had dilitation 05/2012, but dysphagia recurred 07/2012.  Marland Kitchen Hip fracture, right     a. 2012 from a fall.  . Mitral regurgitation     a. Mod by echo 2008.  Marland Kitchen Aortic insufficiency     a. Mild by echo 2008.  Marland Kitchen Urinary incontinence   . Gout   . Arthritis   . Sleep apnea   . Gait instability   . LBBB (left bundle branch block)   . Anemia     Previously reported as IDA in setting of colon CA, then anemia of chronic disease.  . Colon cancer     a. Stage III colon CA s/p R lap-assisted colectomy 05/2006.  Marland Kitchen CHF (congestive heart failure)     a. Echo 2008 in setting of acute illness: EF 35-40%, mod LVH, mild AI, moderate MR, LA mildly dilated, PA pressure .  Marland Kitchen GERD (gastroesophageal reflux disease)   . Hypertension   . CKD (chronic kidney disease)     a. Stage III.  Marland Kitchen Renal cyst, left   . Osteoporosis     a. T10/11/12 compression fracture s/p kyphoplasty (12/10). b. R hip  fracture s/p ORIF (1/12)-->Prolia 12/12.  . Esophageal dysmotility 05/21/2012   Past Surgical History  Procedure Laterality Date  . Esophagogastroduodenoscopy    . Colonosopy    . Hip fracture surgery    . Esophagogastroduodenoscopy N/A 05/21/2012    Procedure: ESOPHAGOGASTRODUODENOSCOPY (EGD);  Surgeon: Hilarie Fredrickson, MD;  Location: [facility] ENDOSCOPY;  Service: Endoscopy;  Laterality: N/A;  . Esophagogastroduodenoscopy (egd) with esophageal dilation N/A 08/22/2012    Procedure: ESOPHAGOGASTRODUODENOSCOPY (EGD) WITH ESOPHAGEAL DILATION;  Surgeon: Hilarie Fredrickson, MD;  Location: [facility] ENDOSCOPY;  Service: Endoscopy;  Laterality: N/A;  . Esophagogastroduodenoscopy N/A 09/27/2012    Procedure: ESOPHAGOGASTRODUODENOSCOPY (EGD);  Surgeon: Iva Boop, MD;  Location: [facility] ENDOSCOPY;  Service: Endoscopy;  Laterality: N/A;  . Cardioversion N/A 10/06/2012    Procedure: CARDIOVERSION;  Surgeon: Laurey Morale, MD;  Location: [facility] ENDOSCOPY;  Service: Cardiovascular;  Laterality: N/A;   Family History  Problem Relation Age of Onset  . Heart disease Mother   . Breast cancer    . Cancer      Head/neck cancer   History  Substance Use Topics  . Smoking status: Never Smoker   . Smokeless tobacco: Not on file  . Alcohol Use: No   OB History   Grav Para Term Preterm Abortions TAB SAB Ect Mult Living  Review of Systems  Constitutional: Positive for fatigue. Negative for fever.  HENT: Negative for neck pain.   Respiratory: Positive for shortness of breath. Negative for cough and wheezing.   Cardiovascular: Negative for chest pain and palpitations.  Gastrointestinal: Negative for nausea, vomiting and abdominal pain.  Musculoskeletal: Negative for arthralgias.  Skin: Negative for wound.  Neurological: Positive for dizziness. Negative for seizures, syncope, speech difficulty, weakness, numbness and headaches.  All other systems reviewed and are negative.    Allergies  Review of patient's  allergies indicates no known allergies.  Home Medications   Current Outpatient Rx  Name  Route  Sig  Dispense  Refill  . amiodarone (PACERONE) 200 MG tablet   Oral   Take 0.5 tablets (100 mg total) by mouth daily.   45 tablet   3   . apixaban (ELIQUIS) 2.5 MG TABS tablet   Oral   Take 1 tablet (2.5 mg total) by mouth 2 (two) times daily.   180 tablet   3   . furosemide (LASIX) 40 MG tablet   Oral   Take 1 tablet (40 mg total) by mouth daily.   30 tablet   6   . hydrALAZINE (APRESOLINE) 25 MG tablet   Oral   Take 1 tablet (25 mg total) by mouth every 8 (eight) hours.   90 tablet   6   . iron polysaccharides (NIFEREX) 150 MG capsule   Oral   Take 150 mg by mouth daily.         . isosorbide mononitrate (IMDUR) 30 MG 24 hr tablet   Oral   Take 1 tablet (30 mg total) by mouth daily.   30 tablet   6   . metoprolol succinate (TOPROL-XL) 50 MG 24 hr tablet   Oral   Take 1 tablet (50 mg total) by mouth daily. Take with or immediately following a meal.   30 tablet   6   . mirabegron ER (MYRBETRIQ) 25 MG TB24   Oral   Take 25 mg by mouth daily.         Marland Kitchen omeprazole (PRILOSEC) 40 MG capsule      Tablet twice daily- 30 minutes before breakfast and supper   60 capsule   11   . traMADol (ULTRAM) 50 MG tablet   Oral   Take 50 mg by mouth every 8 (eight) hours as needed for pain.         Marland Kitchen CALCIUM-VITAMIN D PO   Oral   Take 1 tablet by mouth 2 (two) times daily.         . cholecalciferol (VITAMIN D) 400 UNITS TABS   Oral   Take 400 Units by mouth daily.         . cyanocobalamin 500 MCG tablet   Oral   Take 500 mcg by mouth daily.         Marland Kitchen denosumab (PROLIA) 60 MG/ML SOLN injection   Subcutaneous   Inject 60 mg into the skin every 6 (six) months. Administer in upper arm, thigh, or abdomen         . Multiple Vitamin (MULTIVITAMIN WITH MINERALS) TABS   Oral   Take 1 tablet by mouth daily.         . polyethylene glycol (MIRALAX / GLYCOLAX)  packet   Oral   Take 17 g by mouth daily as needed.   14 each   0    BP 154/79  Pulse 63  Temp(Src) 97.5 F (  36.4 C) (Oral)  Resp 22  SpO2 98% Physical Exam  Vitals reviewed. Constitutional: She is oriented to person, place, and time. She appears well-developed. No distress.  HENT:  Head: Atraumatic.  Mouth/Throat: Oropharynx is clear and moist.  Eyes: EOM are normal. Pupils are equal, round, and reactive to light.  Cardiovascular: Normal rate, regular rhythm and normal heart sounds.   Pulmonary/Chest: Effort normal and breath sounds normal. She exhibits no tenderness.  Abdominal: Soft. Bowel sounds are normal. She exhibits no distension. There is no tenderness.  Musculoskeletal: Normal range of motion. She exhibits no tenderness.       Right hip: She exhibits no bony tenderness.       Left hip: She exhibits no bony tenderness.       Cervical back: She exhibits no bony tenderness.       Thoracic back: She exhibits no bony tenderness.       Lumbar back: She exhibits no bony tenderness.  Neurological: She is alert and oriented to person, place, and time.  Skin: Skin is warm and dry.    ED Course  Procedures (including critical care time) Labs Review Labs Reviewed  CBC WITH DIFFERENTIAL  BASIC METABOLIC PANEL  APTT   Imaging Review No results found.  Date: 10/27/2012  Rate: 59  Rhythm: normal sinus rhythm  QRS Axis: normal  Intervals: QT prolonged  ST/T Wave abnormalities: normal  Conduction Disutrbances:left bundle branch block  Narrative Interpretation: sinus rhythm with PACs, LBBB  Old EKG Reviewed: changes noted, sinus rhythm has replaced atrial fibrillation   MDM   1. Fall, initial encounter   2. Dizzy     79 y/o female with history of Atrial fibrillation s/p cardioversion 9/11 and on apixaban, CHF, CKD presenting with fall at assisted living facility. She reports one month of persistent dizziness. Also had fall on Tuesday. Denies LOC or pain. SOB which  has been persistent for the last few months. No home oxygen use. AAOx 3, hemodynamically stable. No evidence of trauma on exam.   Ct head without acute intracranial injury. CXR normal with chronic changes. Cr at baseline. Dizziness of one month duration with no changes. Doubt CVA, carotid/vertebral dissection, TIA.  Seen by Adolph Pollack in ED with recommendation for holter monitor and d/c hydralazine. Discussed with patient and family who are in agreement. Will see Dr. Shirlee Latch for Holter monitor. Return precautions discussed and they voiced understanding.   Labs and imaging reviewed in my medical decision making. Patient    Abagail Kitchens, MD 10/27/12 1700

## 2012-10-27 NOTE — ED Notes (Signed)
Pt lives at [facility]

## 2012-10-28 ENCOUNTER — Encounter (INDEPENDENT_AMBULATORY_CARE_PROVIDER_SITE_OTHER): Payer: Medicare Other

## 2012-10-28 ENCOUNTER — Encounter: Payer: Self-pay | Admitting: Radiology

## 2012-10-28 DIAGNOSIS — R55 Syncope and collapse: Secondary | ICD-10-CM

## 2012-10-28 NOTE — Progress Notes (Signed)
Patient ID: Melissa Robinson, female   DOB: November 21, 1916, 79 y.o.   MRN: [id number] E Cardio Verite 30 day monitor applied

## 2012-10-31 ENCOUNTER — Inpatient Hospital Stay (HOSPITAL_COMMUNITY): Payer: Medicare Other

## 2012-10-31 ENCOUNTER — Inpatient Hospital Stay (HOSPITAL_COMMUNITY)
Admission: AD | Admit: 2012-10-31 | Discharge: 2012-11-04 | DRG: 149 | Disposition: A | Discharge status: Skilled Nursing Facility | Payer: Medicare Other | Source: Ambulatory Visit | Attending: Internal Medicine | Admitting: Internal Medicine

## 2012-10-31 ENCOUNTER — Encounter (HOSPITAL_COMMUNITY): Payer: Self-pay | Admitting: Internal Medicine

## 2012-10-31 DIAGNOSIS — R42 Dizziness and giddiness: Principal | ICD-10-CM | POA: Diagnosis present

## 2012-10-31 DIAGNOSIS — I4891 Unspecified atrial fibrillation: Secondary | ICD-10-CM | POA: Diagnosis present

## 2012-10-31 DIAGNOSIS — T462X5A Adverse effect of other antidysrhythmic drugs, initial encounter: Secondary | ICD-10-CM | POA: Diagnosis present

## 2012-10-31 DIAGNOSIS — IMO0002 Reserved for concepts with insufficient information to code with codable children: Secondary | ICD-10-CM

## 2012-10-31 DIAGNOSIS — I480 Paroxysmal atrial fibrillation: Secondary | ICD-10-CM | POA: Diagnosis present

## 2012-10-31 DIAGNOSIS — I129 Hypertensive chronic kidney disease with stage 1 through stage 4 chronic kidney disease, or unspecified chronic kidney disease: Secondary | ICD-10-CM | POA: Diagnosis present

## 2012-10-31 DIAGNOSIS — Z85038 Personal history of other malignant neoplasm of large intestine: Secondary | ICD-10-CM

## 2012-10-31 DIAGNOSIS — N318 Other neuromuscular dysfunction of bladder: Secondary | ICD-10-CM | POA: Diagnosis present

## 2012-10-31 DIAGNOSIS — G4730 Sleep apnea, unspecified: Secondary | ICD-10-CM | POA: Diagnosis present

## 2012-10-31 DIAGNOSIS — E440 Moderate protein-calorie malnutrition: Secondary | ICD-10-CM | POA: Diagnosis present

## 2012-10-31 DIAGNOSIS — N184 Chronic kidney disease, stage 4 (severe): Secondary | ICD-10-CM | POA: Diagnosis present

## 2012-10-31 DIAGNOSIS — I5023 Acute on chronic systolic (congestive) heart failure: Secondary | ICD-10-CM | POA: Diagnosis not present

## 2012-10-31 DIAGNOSIS — T465X5A Adverse effect of other antihypertensive drugs, initial encounter: Secondary | ICD-10-CM | POA: Diagnosis present

## 2012-10-31 DIAGNOSIS — R296 Repeated falls: Secondary | ICD-10-CM

## 2012-10-31 DIAGNOSIS — Z66 Do not resuscitate: Secondary | ICD-10-CM | POA: Diagnosis not present

## 2012-10-31 DIAGNOSIS — K219 Gastro-esophageal reflux disease without esophagitis: Secondary | ICD-10-CM | POA: Diagnosis present

## 2012-10-31 DIAGNOSIS — R269 Unspecified abnormalities of gait and mobility: Secondary | ICD-10-CM | POA: Diagnosis present

## 2012-10-31 DIAGNOSIS — I10 Essential (primary) hypertension: Secondary | ICD-10-CM | POA: Diagnosis present

## 2012-10-31 DIAGNOSIS — N390 Urinary tract infection, site not specified: Secondary | ICD-10-CM | POA: Diagnosis present

## 2012-10-31 DIAGNOSIS — R0682 Tachypnea, not elsewhere classified: Secondary | ICD-10-CM | POA: Diagnosis not present

## 2012-10-31 DIAGNOSIS — T463X5A Adverse effect of coronary vasodilators, initial encounter: Secondary | ICD-10-CM | POA: Diagnosis present

## 2012-10-31 DIAGNOSIS — Z9181 History of falling: Secondary | ICD-10-CM

## 2012-10-31 DIAGNOSIS — M810 Age-related osteoporosis without current pathological fracture: Secondary | ICD-10-CM | POA: Diagnosis present

## 2012-10-31 LAB — URINALYSIS, ROUTINE W REFLEX MICROSCOPIC
Glucose, UA: NEGATIVE mg/dL
Nitrite: NEGATIVE
Protein, ur: NEGATIVE mg/dL
Urobilinogen, UA: 0.2 mg/dL (ref 0.0–1.0)
pH: 5.5 (ref 5.0–8.0)

## 2012-10-31 LAB — CBC
HCT: 37.8 % (ref 36.0–46.0)
Hemoglobin: 12.1 g/dL (ref 12.0–15.0)
MCH: 30.5 pg (ref 26.0–34.0)
MCHC: 32 g/dL (ref 30.0–36.0)
RDW: 14.2 % (ref 11.5–15.5)

## 2012-10-31 LAB — URINE MICROSCOPIC-ADD ON

## 2012-10-31 LAB — COMPREHENSIVE METABOLIC PANEL
ALT: 13 U/L (ref 0–35)
AST: 26 U/L (ref 0–37)
Albumin: 3.4 g/dL — ABNORMAL LOW (ref 3.5–5.2)
Alkaline Phosphatase: 46 U/L (ref 39–117)
CO2: 27 mEq/L (ref 19–32)
Calcium: 9.7 mg/dL (ref 8.4–10.5)
GFR calc non Af Amer: 22 mL/min — ABNORMAL LOW (ref >90)
Sodium: 142 mEq/L (ref 135–145)
Total Protein: 6.5 g/dL (ref 6.0–8.3)

## 2012-10-31 MED ORDER — SODIUM CHLORIDE 0.9 % IV SOLN
INTRAVENOUS | Status: DC
  Administered 2012-10-31: 16:00:00 via INTRAVENOUS

## 2012-10-31 MED ORDER — ADULT MULTIVITAMIN W/MINERALS CH
1.0000 | ORAL_TABLET | Freq: Every day | ORAL | Status: DC
  Administered 2012-10-31 – 2012-11-04 (×5): 1 via ORAL
  Filled 2012-10-31 (×5): qty 1

## 2012-10-31 MED ORDER — POLYETHYLENE GLYCOL 3350 17 G PO PACK
17.0000 g | PACK | Freq: Every day | ORAL | Status: DC | PRN
  Filled 2012-10-31: qty 1

## 2012-10-31 MED ORDER — APIXABAN 2.5 MG PO TABS
2.5000 mg | ORAL_TABLET | Freq: Two times a day (BID) | ORAL | Status: DC
  Administered 2012-10-31 – 2012-11-04 (×7): 2.5 mg via ORAL
  Filled 2012-10-31 (×9): qty 1

## 2012-10-31 MED ORDER — PANTOPRAZOLE SODIUM 40 MG PO TBEC
40.0000 mg | DELAYED_RELEASE_TABLET | Freq: Every day | ORAL | Status: DC
  Administered 2012-10-31 – 2012-11-03 (×4): 40 mg via ORAL
  Filled 2012-10-31 (×4): qty 1

## 2012-10-31 MED ORDER — SODIUM CHLORIDE 0.9 % IJ SOLN
3.0000 mL | Freq: Two times a day (BID) | INTRAMUSCULAR | Status: DC
  Administered 2012-11-01 – 2012-11-03 (×6): 3 mL via INTRAVENOUS

## 2012-10-31 MED ORDER — ONDANSETRON HCL 4 MG PO TABS: 4.0000 mg | ORAL_TABLET | Freq: Four times a day (QID) | ORAL | Status: DC | PRN

## 2012-10-31 MED ORDER — POLYSACCHARIDE IRON COMPLEX 150 MG PO CAPS
150.0000 mg | ORAL_CAPSULE | Freq: Every day | ORAL | Status: DC
  Administered 2012-10-31 – 2012-11-04 (×5): 150 mg via ORAL
  Filled 2012-10-31 (×7): qty 1

## 2012-10-31 MED ORDER — METOPROLOL SUCCINATE ER 50 MG PO TB24
50.0000 mg | ORAL_TABLET | Freq: Every day | ORAL | Status: DC
  Administered 2012-10-31 – 2012-11-03 (×4): 50 mg via ORAL
  Filled 2012-10-31 (×5): qty 1

## 2012-10-31 MED ORDER — AMIODARONE HCL 100 MG PO TABS
100.0000 mg | ORAL_TABLET | Freq: Every day | ORAL | Status: DC
  Filled 2012-10-31: qty 1

## 2012-10-31 MED ORDER — CYANOCOBALAMIN 500 MCG PO TABS
500.0000 ug | ORAL_TABLET | Freq: Every day | ORAL | Status: DC
  Administered 2012-10-31 – 2012-11-04 (×5): 500 ug via ORAL
  Filled 2012-10-31 (×5): qty 1

## 2012-10-31 MED ORDER — ACETAMINOPHEN 325 MG PO TABS
650.0000 mg | ORAL_TABLET | Freq: Four times a day (QID) | ORAL | Status: DC | PRN
  Administered 2012-11-02: 650 mg via ORAL
  Filled 2012-10-31: qty 2

## 2012-10-31 MED ORDER — ACETAMINOPHEN 650 MG RE SUPP: 650.0000 mg | Freq: Four times a day (QID) | RECTAL | Status: DC | PRN

## 2012-10-31 MED ORDER — MIRABEGRON ER 25 MG PO TB24
25.0000 mg | ORAL_TABLET | Freq: Every day | ORAL | Status: DC
  Administered 2012-10-31 – 2012-11-04 (×5): 25 mg via ORAL
  Filled 2012-10-31 (×6): qty 1

## 2012-10-31 MED ORDER — CHOLECALCIFEROL 10 MCG (400 UNIT) PO TABS
400.0000 [IU] | ORAL_TABLET | Freq: Every day | ORAL | Status: DC
  Administered 2012-10-31 – 2012-11-04 (×5): 400 [IU] via ORAL
  Filled 2012-10-31 (×5): qty 1

## 2012-10-31 MED ORDER — ONDANSETRON HCL 4 MG/2ML IJ SOLN: 4.0000 mg | Freq: Four times a day (QID) | INTRAMUSCULAR | Status: DC | PRN

## 2012-10-31 NOTE — Consult Note (Signed)
Advanced Heart Failure Team Consult Note  Referring Physician: Dr Clelia Croft Primary Physician: Dr Clelia Croft Primary Cardiologist:  Dr Shirlee Latch  Reason for Consultation: Hypotension   HPI:   The Heart Failure Team was asked to provide a consult for hypotension by Dr Clelia Croft.   Melissa Robinson is a 79 year old with a history of chronic systolic CHF, persistent atrial fibrillation on chronic apixaban and amiodarone, S/P DC-CV 10/06/2012 ,  Gout, anemia, colon cancer S/P colectomy 2008 and CKD creatinine baseline 2.1-2.2.    She was last seen by Dr Shirlee Latch 10/22/12. At that time she was stable with BP 140/80. Maintaining a SR.  Weight 122 pounds.   Evaluated in [facility] ED 10/27/12 due to dizziness and a fall. CT of the head was ok. Creatine 1.79 Hgb 11.7.  Holter monitor per Dr Shirlee Latch and hydralazine was stopped.    Today she presented to her PCP with dizziness sitting and standing. Orthostatic BP  obtained with no significant drop. Blood Pressure #1: 150 / 70 mm Hg , laying Pulse: 62 Blood Pressure #2: 150 / 70 mm Hg , sitting Pulse: 52  Blood Pressure #3: 140 / 70 mm Hg , standing Pulse: 62   Dr Clelia Croft stopped Imdur and lasix.uring office visit and she was a direct admit.   Complaining of dizziness sitting and standing. Denies SOB/PND/Orthopnea/CP . Weight at home has been 125-126 pounds. Says she has not had any medications today.    SH: Lives in [facility]. Widow, 10 children, nonsmoker.   FH: No premature CAD    Review of Systems: [y] = yes, [ ]  = no   General: Weight gain [ ] ; Weight loss [ ] ; Anorexia [ ] ; Fatigue [Y ]; Fever [ ] ; Chills [ ] ; Weakness [ ]   Cardiac: Chest pain/pressure [ ] ; Resting SOB [ ] ; Exertional SOB [ ] ; Orthopnea [ ] ; Pedal Edema [ ] ; Palpitations [ ] ; Syncope [ ] ; Presyncope [Y ]; Paroxysmal nocturnal dyspnea[ ]   Pulmonary: Cough [ ] ; Wheezing[ ] ; Hemoptysis[ ] ; Sputum [ ] ; Snoring [ ]   GI: Vomiting[ ] ; Dysphagia[ ] ; Melena[ ] ; Hematochezia [ ] ; Heartburn[ ] ;  Abdominal pain [ ] ; Constipation [ ] ; Diarrhea [ ] ; BRBPR [ ]   GU: Hematuria[ ] ; Dysuria [ ] ; Nocturia[ ]   Vascular: Pain in legs with walking [ ] ; Pain in feet with lying flat [ ] ; Non-healing sores [ ] ; Stroke [ ] ; TIA [ ] ; Slurred speech [ ] ;  Neuro: Headaches[ ] ; Vertigo[ ] ; Seizures[ ] ; Paresthesias[ ] ;Blurred vision [ ] ; Diplopia [ ] ; Vision changes [ ]   Ortho/Skin: Arthritis [ Y]; Joint pain [ ] ; Muscle pain [ ] ; Joint swelling [ ] ; Back Pain [ ] ; Rash [ ]   Psych: Depression[ ] ; Anxiety[ ]   Heme: Bleeding problems [ ] ; Clotting disorders [ ] ; Anemia [ ]   Endocrine: Diabetes [ ] ; Thyroid dysfunction[ ]   Home Medications Prior to Admission medications   Medication Sig Start Date End Date Taking? Authorizing Provider  amiodarone (PACERONE) 200 MG tablet Take 0.5 tablets (100 mg total) by mouth daily. 10/20/12   Laurey Morale, MD  apixaban (ELIQUIS) 2.5 MG TABS tablet Take 1 tablet (2.5 mg total) by mouth 2 (two) times daily. 10/20/12   Laurey Morale, MD  CALCIUM-VITAMIN D PO Take 1 tablet by mouth 2 (two) times daily.    Historical Provider, MD  cholecalciferol (VITAMIN D) 400 UNITS TABS Take 400 Units by mouth daily.    Historical Provider, MD  cyanocobalamin 500 MCG  tablet Take 500 mcg by mouth daily.    Historical Provider, MD  denosumab (PROLIA) 60 MG/ML SOLN injection Inject 60 mg into the skin every 6 (six) months. Administer in upper arm, thigh, or abdomen    Historical Provider, MD  furosemide (LASIX) 40 MG tablet Take 1 tablet (40 mg total) by mouth daily. 08/23/12   [name], MD  iron polysaccharides (NIFEREX) 150 MG capsule Take 150 mg by mouth daily.    Historical Provider, MD  isosorbide mononitrate (IMDUR) 30 MG 24 hr tablet Take 1 tablet (30 mg total) by mouth daily. 08/23/12   [name], MD  metoprolol succinate (TOPROL-XL) 50 MG 24 hr tablet Take 1 tablet (50 mg total) by mouth daily. Take with or immediately following a meal. 10/06/12   Laurey Morale, MD   mirabegron ER (MYRBETRIQ) 25 MG TB24 Take 25 mg by mouth daily.    Historical Provider, MD  Multiple Vitamin (MULTIVITAMIN WITH MINERALS) TABS Take 1 tablet by mouth daily.    Historical Provider, MD  omeprazole (PRILOSEC) 40 MG capsule Tablet twice daily- 30 minutes before breakfast and supper 05/27/12   Hilarie Fredrickson, MD  polyethylene glycol [facility] / GLYCOLAX) packet Take 17 g by mouth daily as needed. 08/23/12   [name], MD    Past Medical History: Past Medical History  Diagnosis Date  . Hyperlipemia   . Neuropathy   . Atrial fibrillation     a. Noted during 04/2012 hospitalization.  . Peptic stricture of esophagus     a. Recurrent, s/p previous diltiation. b. Food impaction 04/2012 - had dilitation 05/2012, but dysphagia recurred 07/2012.  Marland Kitchen Hip fracture, right     a. 2012 from a fall.  . Mitral regurgitation     a. Mod by echo 2008.  Marland Kitchen Aortic insufficiency     a. Mild by echo 2008.  Marland Kitchen Urinary incontinence   . Gout   . Arthritis   . Sleep apnea   . Gait instability   . LBBB (left bundle branch block)   . Anemia     Previously reported as IDA in setting of colon CA, then anemia of chronic disease.  . Colon cancer     a. Stage III colon CA s/p R lap-assisted colectomy 05/2006.  Marland Kitchen CHF (congestive heart failure)     a. Echo 2008 in setting of acute illness: EF 35-40%, mod LVH, mild AI, moderate MR, LA mildly dilated, PA pressure .  Marland Kitchen GERD (gastroesophageal reflux disease)   . Hypertension   . CKD (chronic kidney disease)     a. Stage III.  Marland Kitchen Renal cyst, left   . Osteoporosis     a. T10/11/12 compression fracture s/p kyphoplasty (12/10). b. R hip fracture s/p ORIF (1/12)-->Prolia 12/12.  . Esophageal dysmotility 05/21/2012    Past Surgical History: Past Surgical History  Procedure Laterality Date  . Esophagogastroduodenoscopy    . Colonosopy    . Hip fracture surgery    . Esophagogastroduodenoscopy N/A 05/21/2012    Procedure: ESOPHAGOGASTRODUODENOSCOPY (EGD);   Surgeon: Hilarie Fredrickson, MD;  Location[facility] ENDOSCOPY;  Service: Endoscopy;  Laterality: N/A;  . Esophagogastroduodenoscopy (egd) with esophageal dilation N/A 08/22/2012    Procedure: ESOPHAGOGASTRODUODENOSCOPY (EGD) WITH ESOPHAGEAL DILATION;  Surgeon: Hilarie Fredrickson, MD;  Location: [facility] ENDOSCOPY;  Service: Endoscopy;  Laterality: N/A;  . Esophagogastroduodenoscopy N/A 09/27/2012    Procedure: ESOPHAGOGASTRODUODENOSCOPY (EGD);  Surgeon: Iva Boop, MD;  Location: [facility] ENDOSCOPY;  Service: Endoscopy;  Laterality: N/A;  . Cardioversion N/A 10/06/2012    Procedure: CARDIOVERSION;  Surgeon: Laurey Morale, MD;  Location: [facility] ENDOSCOPY;  Service: Cardiovascular;  Laterality: N/A;    Family History: Family History  Problem Relation Age of Onset  . Heart disease Mother   . Breast cancer    . Cancer      Head/neck cancer    Social History: History   Social History  . Marital Status: Widowed    Spouse Name: N/A    Number of Children: N/A  . Years of Education: N/A   Social History Main Topics  . Smoking status: Never Smoker   . Smokeless tobacco: None  . Alcohol Use: No  . Drug Use: No  . Sexual Activity: None   Other Topics Concern  . None   Social History Narrative  . None    Allergies:  No Known Allergies  Objective:    Vital Signs:   Temp:  [97.5 F (36.4 C)] 97.5 F (36.4 C) (10/06 1500) Pulse Rate:  [74] 74 (10/06 1500) BP: (147)/(48) 147/48 mmHg (10/06 1500) SpO2:  [97 %] 97 % (10/06 1500) Last BM Date: 10/31/12  Weight change:       Physical Exam: General:  Elderly appearing. No resp difficulty Sitting in chair. Daughter at bedside.  HEENT: normal Neck: supple. JVP 7-8 cm. Carotids 2+ bilat; no bruits. No lymphadenopathy or thryomegaly appreciated. Cor: PMI nondisplaced. Regular rate & rhythm. No rubs, gallops or murmurs. Lungs: clear Abdomen: soft, nontender, nondistended. No hepatosplenomegaly. No bruits or masses. Good bowel sounds. Extremities: no  cyanosis, clubbing, rash, edema Neuro: alert & orientedx3, cranial nerves grossly intact. moves all 4 extremities w/o difficulty. Affect pleasant Skin: L upper back echymotic  Telemetry: SR  Labs: Basic Metabolic Panel:  Recent Labs Lab 10/27/12 1150  NA 140  K 4.3  CL 104  CO2 25  GLUCOSE 97  BUN 20  CREATININE 1.79*  CALCIUM 9.2    Liver Function Tests: No results found for this basename: AST, ALT, ALKPHOS, BILITOT, PROT, ALBUMIN,  in the last 168 hours No results found for this basename: LIPASE, AMYLASE,  in the last 168 hours No results found for this basename: AMMONIA,  in the last 168 hours  CBC:  Recent Labs Lab 10/27/12 1150  WBC 5.4  NEUTROABS 3.9  HGB 11.7*  HCT 36.2  MCV 95.0  PLT 131*    Cardiac Enzymes: No results found for this basename: CKTOTAL, CKMB, CKMBINDEX, TROPONINI,  in the last 168 hours  BNP: BNP (last 3 results)  Recent Labs  08/22/12 0555 08/23/12 0440 09/27/12 1200  PROBNP 9449.0* 9169.0* 10669.0*    CBG:  Recent Labs Lab 10/27/12 1054  GLUCAP 99    Coagulation Studies: No results found for this basename: LABPROT, INR,  in the last 72 hours  Other results: EKG: From PCP NSR with PACs 64 bpm Imaging: No results found.   Medications:     Current Medications: . amiodarone  100 mg Oral Daily  . apixaban  2.5 mg Oral BID  . cholecalciferol  400 Units Oral Daily  . cyanocobalamin  500 mcg Oral Daily  . iron polysaccharides  150 mg Oral Q breakfast  . metoprolol succinate  50 mg Oral Daily  . mirabegron ER  25 mg Oral Daily  . multivitamin with minerals  1 tablet Oral Daily  . pantoprazole  40 mg Oral Daily  . sodium chloride  3 mL Intravenous Q12H    Infusions: .  sodium chloride       Assessment/Plan:  Melissa Robinson at 79 year old admitted today with izziness noted at her PCP. She was not orthostatic and she has adequate blood pressure hopefully dizziness will improve off Imdur and lasix. Labs are  currently pending.   1. Dizziness Hydralazine stopped 10/27/12  Imdur and lasix stopped today by PCP.  Check urine to make sure its not a UTI.   2. A fib : S/P successful DC-CV Maintaining SR. Continue renal dose apixaban . Stop amiodarone as there is ~10% chance it can cause the dizziness. .   3.  Chronic systolic CHF: EF 40-98% on last echo. Volume status stable. Keep off lasix for now. Continue metoprolol succinate 50 mg daily. She will not be placed on ace or spiro due to CKD. Keep off hydralazine and Imdur for now.   4. CKD: creatinine baseline 2.1-2.2   Disposition: Per primary team. SW aware she is from assisted living.    Length of Stay: 0  CLEGG,AMY 10/31/2012, 3:16 PM  Advanced Heart Failure Team Pager [phone] (M-F; 7a - 4p)  Please contact [facility] for night-coverage after hours (4p -7a ) and weekends on [email or web address]  Patient seen with NP, agree with the above note.  Patient has had trouble recently with "dizziness."  It is quite significant.  She notices it when she stands up or bends over.  She was not orthostatic today, and BP has been consistently high normal.  HR is in the 50s-60s.  Her Lasix was held today and she is being gently hydrated.  She remains in NSR.    It is possible that the dizziness is related to amiodarone.  This is a known side effect (non-orthostatic dizziness).  She has been taking 100 mg daily (had nausea on 200 mg daily).  I think that we need to stop amiodarone.  She can continue Toprol XL and Eliquis.  If atrial fibrillation recurs, will need to follow rate control strategy.  I do not think that she is profoundly dry though will make sure creatinine is not significantly elevated from her baseline.  Can hold Lasix for now but would not hydrate too aggressively.   [name] 10/31/2012 4:36 PM

## 2012-10-31 NOTE — H&P (Signed)
Admission H&P  Vital Signs  Entered weight:  117  lbs., 6 oz.  Calculated Weight: 117.38 lbs., ( 53.24 kg) Height: 62.50 in., ( 158.75 cm) Pulse rhythm: irregular  Blood Pressure #1: 150 / 70 mm Hg , laying Pulse: 62  Blood Pressure #2: 150 / 70 mm Hg , sitting Pulse: 52  Blood Pressure #3: 140 / 70 mm Hg , standing Pulse: 62  BMI: 21.13 BSA: 1.53 Wt Chg: -5.62 lbs since 08/17/2012  Vitals entered by: Leeann Must  CNA on October 31, 2012 12:35 PM  Pulse Oximetry  O2 Saturation: 98 %      Falls Assessment  During the past 12 months, have you fallen 2 or more times? No Have you been injured due to a fall? No   History of Present Illness  History from: patient Reason for visit: Routine Follow-up Chief Complaint: pt is still having issues with dizziness History of Present Illness: Has fallen twice in past 2 weeks- first time when getting out of bathtub; 2nd time dizzy when standing up and fell.  Went to ER both times, most recently 10/2 where head CT negative, labs normal.  Evaluated by Cardiology who recommended that she stop Hydralazine at that time.  Has been wearing heart monitor since 10/3.  Remains very weak.  Today feels even worse with increased dizziness when standing up or leaning over.    Had cardioversion 2-3 weeks ago with successful conversion to NSR.  Felt better initially after that but then developed increased weakness.  IN ED- BUN 20, Cr 1.79, Hg 11.7.  Recent TSh normal at cardiologist.   Review of Systems  General:       Complains of fatigue, weight loss.        Denies fevers, chills, sweats.   Ears/Nose/Throat:       Complains of decreased hearing.        Denies nasal congestion.   Cardiovascular:       Complains of dyspnea on exertion.        Denies chest pains, orthopnea, PND, peripheral edema.   Respiratory:       Denies cough, wheezing.   Gastrointestinal:       Denies diarrhea, constipation, heartburn.   Musculoskeletal:       Denies joint pain.    Heme/Lymphatic:       Denies bleeding.    Past History Past Medical History: paroxysmal Afib-->DCCV 9/14 Recurrent esophageal stricture s/p dilation (5/14) HTN Osteoporosis--> T10/11/12 compression fracture s/p kyphoplasty (12/10), Right hip fracture s/p ORIF (1/12)-->Prolia 12/12- L renal cyst Chronic renal insufficiency, stage 3 Gait instability Dysphagia-->EGD with dilatation (5/14) Hypertriglyceridemia CHF (EF 35% in 5/08-->35-40% in 7/14) Colon cancer, stage III s/p hemicolectomy Sleep apnea Arthritis Cholelithiasis Gout GERD Urinary urge incontinence Anemia Surgical History (reviewed - no changes required): Sm bowel resection Compression Fx (T11/T12), T10-->kyphoplasty (12/10) Rotator cuff L renal cyst aspiration EGD w/ dilation (5/14) Family History (reviewed - no changes required): Father:  depression, suicide. Mother deceased at 74, heart dz. Siblings:  M, throat cancer.  S, breast cancer. Social History (reviewed - no changes required): "[place]" DR. Zayin Valadez'S FIRST PATIENT AT [employer] Widow (2003- husband was internist), 10 children. Education:  nursing school, [employer]. 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  Family History Summary:     Reviewed history Last on 08/17/2012 and no changes required:10/31/2012 Mother Baker Pierini.) - Has Family History of Heart Disease - Entered On: 08/17/2012  General Comments - FH: Father:  depression, suicide. Mother deceased  at 79, heart dz. Siblings:  M, throat cancer.  S, breast cancer.  Social History:    Reviewed history from 12/06/2008 and no changes required:       "Saavi"       DR. Viraaj Vorndran'S FIRST PATIENT AT [employer]       Widow (2003- husband was internist), 10 children.       Education:  nursing school, [employer].       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   Physical Exam  General appearance: frail, weak, pale appearing  Eyes  External: sunken orbits Pupils: equal, round, reactive to light and accommodation  Ears, Nose and Throat   External ears: normal, no lesions or deformities External nose: normal, no lesions or deformities Otoscopic: canals clear, tympanic membranes intact, no fluid Nasal: mucosa, septum, and turbinates normal Dental: dentures Pharynx: tongue normal, protrudes mid line,  posterior pharynx without erythema or exudate  Neck  Neck: supple, no masses, trachea midline Thyroid: no nodules, masses, tenderness, or enlargement  Respiratory  Respiratory effort: tachypneic Auscultation: bilateral crackles 1/3 way up  Cardiovascular  Auscultation: regular rate and rhythm with frequent ectopy Periph. circulation: no cyanosis, clubbing; trace edema  Gastrointestinal  Abdomen: soft, non-tender, no masses, bowel sounds normal Liver and spleen: no enlargement or nodularity  Lymphatic  Neck: no cervical adenopathy  Musculoskeletal  Gait and station: difficulty sitting upright due to dizziness Digits and nails: no clubbing, cyanosis, petechiae, or nodes Head and neck: normal alignment and mobility Spine, ribs, pelvis: normal alignment and mobility, no deformity RUE: normal ROM and strength, no joint enlargement or tenderness LUE: normal ROM and strength, no joint enlargement or tenderness RLE: normal ROM and strength, no joint enlargement or tenderness LLE: normal ROM and strength, no joint enlargement or tenderness  Mental Status Exam  Judgment, insight: intact Orientation: oriented to time, place, and person Memory: intact Mood and affect: Normal Mood   Impression & Recommendations:  Problem # 1:  Orthostatic dizziness (ICD-780.4) ([id number]) Her symptoms are most consistent with orthostatic dizziness given the onset with standing.  No other symptoms to suggest vertigo.  Will admit to the hospital for stabilization given her recurrent falls and severity of symptoms.  She appears to be a little hypovolemic in the setting of her diuretic therapy.  We'll hold diuretics and gently hydrate.   Monitor closely for volume overload.  Problem # 2:  Congestive heart failure, systolic, chronic (ICD-428.0) (ICD10-I50.22) This recent ejection fraction was 35-40% in 7/14.  Will hold diuretics and continue to hold hydralazine and Imdur due to her dizziness.  Monitor volume status closely.  Will discuss with cardiology. Her updated medication list for this problem includes:    Lasix 40 Mg Tabs (Furosemide) ..... One po every day    Metoprolol Succinate Er 50 Mg Xr24h-tab (Metoprolol succinate) ..... One po every day   Problem # 3:  Atrial fibrillation, paroxysmal (ICD-427.31) (ICD10-I48.0) EKG today shows sinus rhythm with PACs after recent cardioversion.  Her rate is in the 50s and 60s.  This may be contributing some to her dizziness. Her updated medication list for this problem includes:    Amiodarone Hcl 100 Mg Tabs (Amiodarone hcl) ..... One tab twice a day    Metoprolol Succinate Er 50 Mg Xr24h-tab (Metoprolol succinate) ..... One po every day  Orders: EKG 12 Leads ([id number])   Problem # 4:  RENAL DISEASE, CHRONIC, STAGE IV (ICD-585.4) (ICD10-N18.4) Most recent creatinine was 1.79 which is at  baseline.  Problem # 5:  HYPERTENSION (ICD-401.9) (ICD10-I10) Monitor blood pressure with changes in her regimen. The following medications were removed from the medication list:    Hydralazine Hcl 25 Mg Tabs (Hydralazine hcl) ..... One po three times a day  Her updated medication list for this problem includes:    Lasix 40 Mg Tabs (Furosemide) ..... One po every day    Metoprolol Succinate Er 50 Mg Xr24h-tab (Metoprolol succinate) ..... One po every day   Other Orders: Pulse Ox ([id number]) Fluvirin Intramuscular Injectable ([id number])   FOLLOW UP PLANS    Next office visit in : 2 day(s)  For: Follow Up  ]  Adult Questionnaire  1) Is the patient sick today?  No 2) Does the patient have allergies to medications, food, a vaccine component, or latex?  No 3) Vaccine information  given and explained to patient?  Yes  Vaccines Administered/Entered: Vaccination Group: Influenza Series: 2 Vaccination: Fluzone Quadrivalent Intramuscular Suspension 0.5 ML Administered Date: 10/31/2012 12:44 PM VFC Eligibility: Not VFC Eligible Dose/Route/Site : 0.5 ml / IM / Right Deltoid Mfr/Lot#/Exp.Date: Sheran Lawless / [id number] / 07/25/2013 VIS Date : 08/21/2011 Administered by : Gerilyn Pilgrim  CNA, Tresa Endo  Comments : pt tolerated well    Electronically signed by Donnie Mesa MD on 10/31/2012 at 1:42 PM

## 2012-10-31 NOTE — Progress Notes (Signed)
Clinical Social Work Department BRIEF PSYCHOSOCIAL ASSESSMENT 10/31/2012  Patient:  Melissa Robinson, Melissa Robinson     Account Number:  [id number]     Admit date:  10/31/2012  Clinical Social Worker:  Carren Rang  Date/Time:  10/31/2012 03:20 PM  Referred by:  Care Management  Date Referred:  10/31/2012 Referred for  ALF Placement   Other Referral:   Interview type:  Other - See comment Other interview type:   CSW spoke with patient and daughter at bedside    PSYCHOSOCIAL DATA Living Status:  FACILITY Admitted from facility:  [facility] Level of care:  Independent Living Primary support name:  Jake Michaelis Primary support relationship to patient:  CHILD, ADULT Degree of support available:   Good    CURRENT CONCERNS Current Concerns  Post-Acute Placement   Other Concerns:    SOCIAL WORK ASSESSMENT / PLAN CSW received referral from CM who stated patient is from a facility. CSW went into room and introduced self to patient and daughter at bedside. Daughter stated that patient is from [facility]. Daughter stated that the plan is to return back to Independent Living, unless PT recommends short term rehab, then the preference is [facility]. CSW is awaiting pt/ot evaluation before searching for SNF beds. CSW will complete FL2 for MD signature   Assessment/plan status:  Psychosocial Support/Ongoing Assessment of Needs Other assessment/ plan:   Information/referral to community resources:   CSW contact information/SNF    PATIENT'S/FAMILY'S RESPONSE TO PLAN OF CARE: Patient and daughter are agreeable to SNF if PT/OT recommends that route.       Maree Krabbe, MSW, [facility] [phone]

## 2012-10-31 NOTE — ED Provider Notes (Signed)
I saw and evaluated the patient, reviewed the resident's note and I agree with the findings and plan and agree with their ECG interpretation. Patient with fall and lightheadedness. Workup reassuring. May require monitoring as an outpatient. Will discharge  Melissa Robinson. Rubin Payor, MD 10/31/12 1445

## 2012-11-01 DIAGNOSIS — E440 Moderate protein-calorie malnutrition: Secondary | ICD-10-CM | POA: Diagnosis present

## 2012-11-01 DIAGNOSIS — R42 Dizziness and giddiness: Secondary | ICD-10-CM | POA: Diagnosis present

## 2012-11-01 DIAGNOSIS — I5023 Acute on chronic systolic (congestive) heart failure: Secondary | ICD-10-CM

## 2012-11-01 DIAGNOSIS — I509 Heart failure, unspecified: Secondary | ICD-10-CM

## 2012-11-01 DIAGNOSIS — R296 Repeated falls: Secondary | ICD-10-CM

## 2012-11-01 LAB — BASIC METABOLIC PANEL
BUN: 26 mg/dL — ABNORMAL HIGH (ref 6–23)
Chloride: 104 mEq/L (ref 96–112)
Creatinine, Ser: 1.71 mg/dL — ABNORMAL HIGH (ref 0.50–1.10)
GFR calc Af Amer: 29 mL/min — ABNORMAL LOW (ref >90)
GFR calc non Af Amer: 25 mL/min — ABNORMAL LOW (ref >90)
Sodium: 140 mEq/L (ref 135–145)

## 2012-11-01 LAB — PRO B NATRIURETIC PEPTIDE: Pro B Natriuretic peptide (BNP): 19847 pg/mL — ABNORMAL HIGH (ref 0–450)

## 2012-11-01 MED ORDER — ENSURE COMPLETE PO LIQD
237.0000 mL | ORAL | Status: DC
  Administered 2012-11-03: 237 mL via ORAL

## 2012-11-01 NOTE — Progress Notes (Signed)
UR Completed.  Melissa Robinson [phone] 11/01/2012

## 2012-11-01 NOTE — Evaluation (Signed)
I have reviewed this note and agree with all findings. Kati Earl Zellmer, PT, DPT Pager: [phone]   

## 2012-11-01 NOTE — Progress Notes (Signed)
Advanced Heart Failure Rounding Note   Subjective:   Yesterday she was admitted due to dizziness. Amiodarone stopped. Placed on IV fluids.    Feeling better this am. Denies SOB/Orthopnea.     Objective:   Weight Range:  Vital Signs:   Temp:  [97.5 F (36.4 C)-98.2 F (36.8 C)] 98.2 F (36.8 C) (10/07 0351) Pulse Rate:  [59-105] 105 (10/07 0351) Resp:  [18] 18 (10/07 0351) BP: (124-171)/(48-116) 138/89 mmHg (10/07 0351) SpO2:  [96 %-98 %] 96 % (10/07 0351) Weight:  [119 lb 4.3 oz (54.1 kg)-122 lb (55.339 kg)] 119 lb 4.3 oz (54.1 kg) (10/07 0351) Last BM Date: 10/31/12  Weight change: Filed Weights   10/31/12 1726 11/01/12 0351  Weight: 122 lb (55.339 kg) 119 lb 4.3 oz (54.1 kg)    Intake/Output:   Intake/Output Summary (Last 24 hours) at 11/01/12 0710 Last data filed at 11/01/12 0600  Gross per 24 hour  Intake  687.5 ml  Output    150 ml  Net  537.5 ml     Physical Exam: General:  Elderly  appearing. No resp difficulty HEENT: normal Neck: supple. JVP 7-8 . Carotids 2+ bilat; no bruits. No lymphadenopathy or thryomegaly appreciated. Cor: PMI nondisplaced. Regular rate & rhythm. No rubs, gallops or murmurs. Lungs: clear Abdomen: soft, nontender, nondistended. No hepatosplenomegaly. No bruits or masses. Good bowel sounds. Extremities: no cyanosis, clubbing, rash, edema Neuro: alert & orientedx3, cranial nerves grossly intact. moves all 4 extremities w/o difficulty. Affect pleasant  Telemetry: NSR  Labs: Basic Metabolic Panel:  Recent Labs Lab 10/27/12 1150 10/31/12 1600  NA 140 142  K 4.3 3.8  CL 104 104  CO2 25 27  GLUCOSE 97 152*  BUN 20 27*  CREATININE 1.79* 1.89*  CALCIUM 9.2 9.7    Liver Function Tests:  Recent Labs Lab 10/31/12 1600  AST 26  ALT 13  ALKPHOS 46  BILITOT 0.5  PROT 6.5  ALBUMIN 3.4*   No results found for this basename: LIPASE, AMYLASE,  in the last 168 hours No results found for this basename: AMMONIA,  in the last  168 hours  CBC:  Recent Labs Lab 10/27/12 1150 10/31/12 1600  WBC 5.4 5.3  NEUTROABS 3.9  --   HGB 11.7* 12.1  HCT 36.2 37.8  MCV 95.0 95.2  PLT 131* 158    Cardiac Enzymes: No results found for this basename: CKTOTAL, CKMB, CKMBINDEX, TROPONINI,  in the last 168 hours  BNP: BNP (last 3 results)  Recent Labs  08/23/12 0440 09/27/12 1200 10/31/12 1600  PROBNP 9169.0* 10669.0* 16383.0*     Other results:  EKG:   Imaging: X-ray Chest Pa And Lateral   11/01/2012   *RADIOLOGY REPORT*  Clinical Data: Follow up congestive heart failure.  CHEST - 2 VIEW  Comparison: Chest radiograph performed 10/27/2012  Findings: The lungs are hyperexpanded, with flattening of the hemidiaphragms, compatible with COPD.  Chronic interstitial prominence is again noted, with bronchitic changes.  Mild vascular congestion is noted.  There is no evidence of pleural effusion or pneumothorax.  The heart is normal in size; the mediastinal contour is within normal limits.  No acute osseous abnormalities are seen.  Multiple chronic compression deformities are again noted along the lower thoracic spine.  IMPRESSION: Findings of COPD, with chronic lung changes again noted.  Mild cardiomegaly.  Mild vascular congestion, without definite evidence of pulmonary edema.   Original Report Authenticated By: Tonia Ghent, M.D.      Medications:  Scheduled Medications: . apixaban  2.5 mg Oral BID  . cholecalciferol  400 Units Oral Daily  . cyanocobalamin  500 mcg Oral Daily  . iron polysaccharides  150 mg Oral Q breakfast  . metoprolol succinate  50 mg Oral Daily  . mirabegron ER  25 mg Oral Daily  . multivitamin with minerals  1 tablet Oral Daily  . pantoprazole  40 mg Oral Daily  . sodium chloride  3 mL Intravenous Q12H     Infusions: . sodium chloride 50 mL/hr at 10/31/12 1615     PRN Medications:  acetaminophen, acetaminophen, ondansetron (ZOFRAN) IV, ondansetron, polyethylene  glycol   Assessment/Plan Discussion   1. Dizziness  Hydralazine stopped 10/27/12 Imdur and lasix stopped today by PCP.  Plan to stop IV fluids.  2. A fib : S/P successful DC-CV Maintaining SR. Continue renal dose apixaban . Stop amiodarone as there is ~10% chance it can cause the dizziness. .  3. Chronic systolic CHF: EF 29-56% on last echo. Volume status stable. Keep off lasix for now. Continue metoprolol succinate 50 mg daily. She will not be placed on ace or spiro due to CKD. Keep off hydralazine and Imdur for now.  4. CKD: creatinine baseline 2.1-2.2  5. UTI    Length of Stay: 1   CLEGG,AMY 11/01/2012, 7:10 AM  Advanced Heart Failure Team Pager [phone] (M-F; 7a - 4p)  Please contact [facility] for night-coverage after hours (4p -7a ) and weekends on [email or web address]  Seems to be doing better this morning.  Less dizzy when got up to use bathroom.  Will leave off amiodarone as this may have caused her symptoms.  Stop IV fluid today, suspect will need to go back on Lasix before long.   [name] 11/01/2012 7:38 AM

## 2012-11-01 NOTE — Progress Notes (Signed)
Clinical Social Work Department CLINICAL SOCIAL WORK PLACEMENT NOTE 11/01/2012  Patient:  Melissa Robinson, Melissa Robinson  Account Number:  [id number] Admit date:  10/31/2012  Clinical Social Worker:  Carren Rang  Date/time:  11/01/2012 12:49 PM  Clinical Social Work is seeking post-discharge placement for this patient at the following level of care:   SKILLED NURSING   (*CSW will update this form in Epic as items are completed)   10/31/2012  Patient/family provided with [facility] Department of Clinical Social Work's list of facilities offering this level of care within the geographic area requested by the patient (or if unable, by the patient's family).  10/31/2012  Patient/family informed of their freedom to choose among providers that offer the needed level of care, that participate in Medicare, Medicaid or managed care program needed by the patient, have an available bed and are willing to accept the patient.  10/31/2012  Patient/family informed of [facility]' ownership interest in [facility], as well as of the fact that they are under no obligation to receive care at this facility.  PASARR submitted to EDS on  PASARR number received from EDS on   FL2 transmitted to all facilities in geographic area requested by pt/family on  11/01/2012 FL2 transmitted to all facilities within larger geographic area on   Patient informed that his/her managed care company has contracts with or will negotiate with  certain facilities, including the following:     Patient/family informed of bed offers received:   Patient chooses bed at  Physician recommends and patient chooses bed at    Patient to be transferred to  on   Patient to be transferred to facility by   The following physician request were entered in Epic:   Additional Comments:  Maree Krabbe, MSW, [employer] [phone]

## 2012-11-01 NOTE — Evaluation (Signed)
Occupational Therapy Evaluation Patient Details Name: Melissa Robinson MRN: [id number] DOB: 1916-08-24 Today's Date: 11/01/2012 Time: 4098-1191 OT Time Calculation (min): 39 min  OT Assessment / Plan / Recommendation History of present illness Has fallen twice in past 2 weeks- first time when getting out of bathtub; 2nd time dizzy when standing up and fell.  Went to ER both times, most recently 10/2 where head CT negative, labs normal.  Evaluated by Cardiology who recommended that she stop Hydralazine at that time.  Has been wearing heart monitor since 10/3.  Remains very weak.  Today feels even worse with increased dizziness when standing up or leaning over.     Clinical Impression   Pt admitted with dizziness and falls.  Pt presents to OT with generalized weakness.  She did complain of dizziness with initial stand, but it resolved within 10-15 seconds.  Unable to elicit dizziness with head turns or visual assessment.  Pt with dyspnea 3/4 with minimal activity BP 165/85; HR 74; 02 97%.  Pt took greater than 10 mins to recover.   By her report she has been functioning marginally at [facility] since she moved there ~1 month ago.  Feel she will need SNF level rehab at discharge, and question if she may need to consider assisted living long term.   SHe will benefit from continued OT to maximize safety and independence with BADLs and to facilitate discharge to next venue of care.     OT Assessment  Patient needs continued OT Services    Follow Up Recommendations  SNF;Supervision/Assistance - 24 hour    Barriers to Discharge Decreased caregiver support    Equipment Recommendations  None recommended by OT    Recommendations for Other Services    Frequency  Min 2X/week    Precautions / Restrictions Precautions Precautions: Fall Restrictions Weight Bearing Restrictions: No   Pertinent Vitals/Pain     ADL  Eating/Feeding: Independent Where Assessed - Eating/Feeding: Chair Grooming: Minimal  assistance Where Assessed - Grooming: Supported sitting Upper Body Bathing: Moderate assistance Where Assessed - Upper Body Bathing: Supported sitting Lower Body Bathing: Moderate assistance Where Assessed - Lower Body Bathing: Supported sit to stand Upper Body Dressing: Minimal assistance Where Assessed - Upper Body Dressing: Supported sitting Lower Body Dressing: Moderate assistance Where Assessed - Lower Body Dressing: Supported sit to Pharmacist, hospital: Minimal assistance Toilet Transfer Method: Sit to stand;Stand pivot Acupuncturist: Comfort height toilet;Grab bars Toileting - Clothing Manipulation and Hygiene: Minimal assistance Where Assessed - Engineer, mining and Hygiene: Standing Equipment Used: Rolling walker Transfers/Ambulation Related to ADLs: min A with rolling walker.  Dyspnea 3/4.  Min verbal cues for walker safety ADL Comments: Pt fatigues rapidly    OT Diagnosis: Generalized weakness  OT Problem List: Decreased strength;Decreased activity tolerance;Impaired balance (sitting and/or standing);Decreased knowledge of use of DME or AE;Cardiopulmonary status limiting activity OT Treatment Interventions: Self-care/ADL training;Energy conservation;DME and/or AE instruction;Therapeutic activities;Patient/family education;Balance training   OT Goals(Current goals can be found in the care plan section) Acute Rehab OT Goals Patient Stated Goal: to be able to walk 1,000' to dining hall without assistance. OT Goal Formulation: With patient Time For Goal Achievement: 11/08/12 Potential to Achieve Goals: Good ADL Goals Pt Will Perform Grooming: with supervision;standing Pt Will Perform Upper Body Bathing: with set-up;sitting Pt Will Perform Lower Body Bathing: with supervision;sit to/from stand Pt Will Perform Upper Body Dressing: with set-up;sitting Pt Will Perform Lower Body Dressing: with supervision;sit to/from stand Pt Will Transfer to Toilet:  with supervision;regular height toilet;grab bars;ambulating Pt Will Perform Toileting - Clothing Manipulation and hygiene: with supervision;sit to/from stand  Visit Information  Last OT Received On: 11/01/12 Assistance Needed: +1 History of Present Illness: Has fallen twice in past 2 weeks- first time when getting out of bathtub; 2nd time dizzy when standing up and fell.  Went to ER both times, most recently 10/2 where head CT negative, labs normal.  Evaluated by Cardiology who recommended that she stop Hydralazine at that time.  Has been wearing heart monitor since 10/3.  Remains very weak.  Today feels even worse with increased dizziness when standing up or leaning over.         Prior Functioning     Home Living Family/patient expects to be discharged to:: Private residence Living Arrangements: Alone Available Help at Discharge: Available PRN/intermittently;Family Type of Home: Independent living facility Home Access: Level entry;Elevator Home Layout: One level Home Equipment: Tub bench;Walker - 4 wheels Additional Comments: Pt reports she has been at [facility] x 1 month and was a [employer] prior to that. Prior Function Level of Independence: Independent with assistive device(s) Comments: Pt and pt's son report her room is about 1,000' from dining hall and she's been requiring assistance to ambulate with rollator to dining hall due to feeling weak. Communication Communication: No difficulties Dominant Hand: Right         Vision/Perception Vision - History Patient Visual Report: No change from baseline Vision - Assessment Vision Assessment: Vision tested Ocular Range of Motion: Within Functional Limits Tracking/Visual Pursuits: Able to track stimulus in all quads without difficulty Saccades: Within functional limits Additional Comments: Saccades, head turns, pursuits did not elicit dizziness   Cognition  Cognition Arousal/Alertness: Awake/alert Behavior During  Therapy: WFL for tasks assessed/performed Overall Cognitive Status: Within Functional Limits for tasks assessed    Extremity/Trunk Assessment Upper Extremity Assessment Upper Extremity Assessment: RUE deficits/detail RUE Deficits / Details: long standing shoulder deficits.  Shoulder elevation ~50* Lower Extremity Assessment Lower Extremity Assessment: Generalized weakness;RLE deficits/detail;LLE deficits/detail RLE Deficits / Details: Pt able to move LE's against gravity but reports feeling weak during amb. LLE Deficits / Details: Pt able to move LE's against gravity but reports feeling weak during amb. Cervical / Trunk Assessment Cervical / Trunk Assessment: Kyphotic     Mobility Bed Mobility Bed Mobility: Not assessed Details for Bed Mobility Assistance: pt sitting in chair upon arrival. Transfers Transfers: Sit to Stand;Stand to Sit Sit to Stand: 4: Min assist;From chair/3-in-1;With armrests Stand to Sit: To chair/3-in-1;With armrests;4: Min guard Details for Transfer Assistance: Min A due to weakness and pt c/o dizziness upon standing, sat pt back down and waited for dizziness to subside before standing again, dizziness resolved within 5 seconds of standing second time.      Exercise     Balance Balance Balance Assessed: Yes Static Sitting Balance Static Sitting - Balance Support: Feet supported;No upper extremity supported Static Sitting - Level of Assistance: 6: Modified independent (Device/Increase time) Static Sitting - Comment/# of Minutes: fatigues and has to move into supported sitting position Static Standing Balance Static Standing - Balance Support: Bilateral upper extremity supported;Right upper extremity supported Static Standing - Level of Assistance: 4: Min assist Static Standing - Comment/# of Minutes: dizziness   End of Session OT - End of Session Equipment Utilized During Treatment: Rolling walker Activity Tolerance: Patient limited by lethargy Patient  left: in chair;with call bell/phone within reach;with family/visitor present Nurse Communication: Mobility status;Other (comment) (vitals, dyspnea)  GO  Jeani Hawking M 11/01/2012, 12:55 PM

## 2012-11-01 NOTE — Progress Notes (Signed)
Pt. telemetry box stopped working per central telementry.  Changed pt. Cords, stickers, and batteries in 2W18 and it was till not reporting data.  Pt. Box change to 2w04 and central telementry notified of box change.  RN will continue to monitor.   Thane Edu, RN

## 2012-11-01 NOTE — Evaluation (Signed)
Physical Therapy Evaluation Patient Details Name: Melissa Robinson MRN: [id number] DOB: 08-Mar-1916 Today's Date: 11/01/2012 Time: 3086-5784 PT Time Calculation (min): 17 min  PT Assessment / Plan / Recommendation History of Present Illness  Has fallen twice in past 2 weeks- first time when getting out of bathtub; 2nd time dizzy when standing up and fell.  Went to ER both times, most recently 10/2 where head CT negative, labs normal.  Evaluated by Cardiology who recommended that she stop Hydralazine at that time.  Has been wearing heart monitor since 10/3.  Remains very weak.  Today feels even worse with increased dizziness when standing up or leaning over.    Clinical Impression  Pt admitted with dizziness. Pt currently with functional limitations due to deficits listed below (see PT Problem List). Pt able to ambulate 100' today with RW and min guard, pt's primary limiting factor in participation is fatigue.  PT recommends ST-SNF to increase strength and improve endurance. Pt will benefit from skilled PT to increase their independence and safety with mobility to allow discharge.    PT Assessment  Patient needs continued PT services    Follow Up Recommendations  SNF    Does the patient have the potential to tolerate intense rehabilitation      Barriers to Discharge Decreased caregiver support      Equipment Recommendations  None recommended by PT    Recommendations for Other Services     Frequency Min 3X/week    Precautions / Restrictions Precautions Precautions: Fall Restrictions Weight Bearing Restrictions: No   Pertinent Vitals/Pain No c/o of pain during session.Pt reports dizziness upon standing, took seated rest break and dizziness resolved within 5 seconds upon standing the second time. SaO2 after ambulation on room air: 97% and HR: 79 bpm with no c/o of SOB or dizziness.      Mobility  Bed Mobility Bed Mobility: Not assessed Details for Bed Mobility Assistance: pt  sitting in chair upon arrival. Transfers Transfers: Sit to Stand;Stand to Sit Sit to Stand: 4: Min assist;From chair/3-in-1;With armrests Stand to Sit: To chair/3-in-1;With armrests;4: Min guard Details for Transfer Assistance: Min A due to weakness and pt c/o dizziness upon standing, sat pt back down and waited for dizziness to subside before standing again, dizziness resolved within 5 seconds of standing second time.  Ambulation/Gait Ambulation/Gait Assistance: 4: Min guard Ambulation Distance (Feet): 100 Feet Assistive device: Rolling walker Ambulation/Gait Assistance Details: Min guard to ensure safety due to reported dizziness upon standing and weakness, pt reports feeling better during amb. SaO2 on room air after amb: 97% and HR: 79 bpm. Gait Pattern: Step-through pattern;Trunk flexed;Decreased stride length;Decreased dorsiflexion - left;Decreased dorsiflexion - right;Left flexed knee in stance;Right flexed knee in stance Gait velocity: Decreased    Exercises     PT Diagnosis: Difficulty walking;Generalized weakness  PT Problem List: Decreased strength;Decreased activity tolerance;Decreased mobility;Decreased knowledge of use of DME PT Treatment Interventions: DME instruction;Gait training;Functional mobility training;Therapeutic activities;Therapeutic exercise;Patient/family education     PT Goals(Current goals can be found in the care plan section) Acute Rehab PT Goals Patient Stated Goal: to be able to walk 1,000' to dining hall without assistance. PT Goal Formulation: With patient/family Time For Goal Achievement: 11/15/12 Potential to Achieve Goals: Good  Visit Information  Last PT Received On: 11/01/12 Assistance Needed: +1 History of Present Illness: Has fallen twice in past 2 weeks- first time when getting out of bathtub; 2nd time dizzy when standing up and fell.  Went to ER both times,  most recently 10/2 where head CT negative, labs normal.  Evaluated by Cardiology who  recommended that she stop Hydralazine at that time.  Has been wearing heart monitor since 10/3.  Remains very weak.  Today feels even worse with increased dizziness when standing up or leaning over.         Prior Functioning  Home Living Family/patient expects to be discharged to:: Private residence Living Arrangements: Alone Available Help at Discharge: Available PRN/intermittently;Family Type of Home: Independent living facility Home Access: Level entry;Elevator Home Layout: One level Home Equipment: Tub bench;Walker - 4 wheels Additional Comments: Pt reports she has been at [facility] x 1 month and was a [employer] prior to that. Prior Function Level of Independence: Independent with assistive device(s) Comments: Pt and pt's son report her room is about 1,000' from dining hall and she's been requiring assistance to ambulate with rollator to dining hall due to feeling weak. Communication Communication: No difficulties Dominant Hand: Right    Cognition  Cognition Arousal/Alertness: Awake/alert Behavior During Therapy: WFL for tasks assessed/performed Overall Cognitive Status: Within Functional Limits for tasks assessed    Extremity/Trunk Assessment Upper Extremity Assessment Upper Extremity Assessment: RUE deficits/detail RUE Deficits / Details: long standing shoulder deficits.  Shoulder elevation ~50* Lower Extremity Assessment Lower Extremity Assessment: Generalized weakness;RLE deficits/detail;LLE deficits/detail RLE Deficits / Details: Pt able to move LE's against gravity but reports feeling weak during amb. LLE Deficits / Details: Pt able to move LE's against gravity but reports feeling weak during amb. Cervical / Trunk Assessment Cervical / Trunk Assessment: Kyphotic   Balance Balance Balance Assessed: Yes Static Sitting Balance Static Sitting - Balance Support: Feet supported;No upper extremity supported Static Sitting - Level of Assistance: 6: Modified independent  (Device/Increase time) Static Sitting - Comment/# of Minutes: fatigues and has to move into supported sitting position Static Standing Balance Static Standing - Balance Support: Bilateral upper extremity supported;Right upper extremity supported Static Standing - Level of Assistance: 4: Min assist Static Standing - Comment/# of Minutes: dizziness  End of Session PT - End of Session Equipment Utilized During Treatment: Gait belt Activity Tolerance: Patient limited by fatigue Patient left: in chair;with call bell/phone within reach  GP     Sol Blazing 11/01/2012, 11:58 AM

## 2012-11-01 NOTE — Progress Notes (Signed)
INITIAL NUTRITION ASSESSMENT  DOCUMENTATION CODES Per approved criteria  -Moderate malnutrition of chronic illness   INTERVENTION: 1.  General healthful diet; encourage intake as needed. 2.  Supplements; Ensure Complete once daily.   NUTRITION DIAGNOSIS: Inadequate oral intake related to poor appetite as evidenced by pt report.   Monitor:  1.  Food/Beverage; pt meeting >/=90% estimated needs with tolerance. 2.  Wt/wt change; monitor trends  Reason for Assessment: MST  79 y.o. female  Admitting Dx: Dizziness  ASSESSMENT: RD met with patient who reports her appetite was decreased PTA but has since improved. Pt states that her wt is stable at 118-119 lbs however per chart review she was previously stable at 125 lbs as recently as 1 month ago.  She denies nutrition-related concerns stating "I eat well, they prepare my meals at [facility]."  Pt is on a MVI which has been continued.   Nutrition Focused Physical Exam:  Subcutaneous Fat:  Orbital Region: WNL Upper Arm Region: mild wasting Thoracic and Lumbar Region: N/A  Muscle:  Temple Region: WNL Clavicle Bone Region: mild wasting Clavicle and Acromion Bone Region: WNL Scapular Bone Region: N/A Dorsal Hand: mild wasting Patellar Region: WNL Anterior Thigh Region: WNL Posterior Calf Region: WNL  Edema: none present  Pt with ~5% wt change over the past one month.  Pt meets criteria for moderate MALNUTRITION in the context of chronic as evidenced by 5% wt loss in 1 month with evidence of mild wasting on physical exam.  Height: Ht Readings from Last 1 Encounters:  10/31/12 5\' 5"  (1.651 m)    Weight: Wt Readings from Last 1 Encounters:  11/01/12 119 lb 4.3 oz (54.1 kg)    Ideal Body Weight: 125  % Ideal Body Weight: 95%  Wt Readings from Last 10 Encounters:  11/01/12 119 lb 4.3 oz (54.1 kg)  10/20/12 122 lb (55.339 kg)  09/20/12 125 lb (56.7 kg)  09/08/12 128 lb 3.2 oz (58.151 kg)  08/31/12 124 lb 1.9 oz (56.3  kg)  08/24/12 121 lb (54.885 kg)  08/23/12 120 lb 4.8 oz (54.568 kg)  08/23/12 120 lb 4.8 oz (54.568 kg)  05/27/12 125 lb (56.7 kg)  05/21/12 130 lb 1.6 oz (59.013 kg)    Usual Body Weight: 125 lbs  % Usual Body Weight: 95%  BMI:  Body mass index is 19.85 kg/(m^2).  Estimated Nutritional Needs: Kcal: 1300-1500 Protein: 55-65g Fluid: ~1.5 L/day  Skin: intact  Diet Order: Cardiac  EDUCATION NEEDS: -Education needs addressed   Intake/Output Summary (Last 24 hours) at 11/01/12 1725 Last data filed at 11/01/12 1300  Gross per 24 hour  Intake  987.5 ml  Output    351 ml  Net  636.5 ml    Last BM: 10/6  Labs:   Recent Labs Lab 10/27/12 1150 10/31/12 1600 11/01/12 0815  NA 140 142 140  K 4.3 3.8 4.3  CL 104 104 104  CO2 25 27 27   BUN 20 27* 26*  CREATININE 1.79* 1.89* 1.71*  CALCIUM 9.2 9.7 9.7  GLUCOSE 97 152* 110*    CBG (last 3)  No results found for this basename: GLUCAP,  in the last 72 hours  Scheduled Meds: . apixaban  2.5 mg Oral BID  . cholecalciferol  400 Units Oral Daily  . cyanocobalamin  500 mcg Oral Daily  . iron polysaccharides  150 mg Oral Q breakfast  . metoprolol succinate  50 mg Oral Daily  . mirabegron ER  25 mg Oral Daily  .  multivitamin with minerals  1 tablet Oral Daily  . pantoprazole  40 mg Oral Daily  . sodium chloride  3 mL Intravenous Q12H    Continuous Infusions:   Past Medical History  Diagnosis Date  . Hyperlipemia   . Neuropathy   . Atrial fibrillation     a. Noted during 04/2012 hospitalization.  . Peptic stricture of esophagus     a. Recurrent, s/p previous diltiation. b. Food impaction 04/2012 - had dilitation 05/2012, but dysphagia recurred 07/2012.  Marland Kitchen Hip fracture, right     a. 2012 from a fall.  . Mitral regurgitation     a. Mod by echo 2008.  Marland Kitchen Aortic insufficiency     a. Mild by echo 2008.  Marland Kitchen Urinary incontinence   . Gout   . Arthritis   . Sleep apnea   . Gait instability   . LBBB (left bundle  branch block)   . Anemia     Previously reported as IDA in setting of colon CA, then anemia of chronic disease.  . Colon cancer     a. Stage III colon CA s/p R lap-assisted colectomy 05/2006.  Marland Kitchen CHF (congestive heart failure)     a. Echo 2008 in setting of acute illness: EF 35-40%, mod LVH, mild AI, moderate MR, LA mildly dilated, PA pressure .  Marland Kitchen GERD (gastroesophageal reflux disease)   . Hypertension   . CKD (chronic kidney disease)     a. Stage III.  Marland Kitchen Renal cyst, left   . Osteoporosis     a. T10/11/12 compression fracture s/p kyphoplasty (12/10). b. R hip fracture s/p ORIF (1/12)-->Prolia 12/12.  . Esophageal dysmotility 05/21/2012    Past Surgical History  Procedure Laterality Date  . Esophagogastroduodenoscopy    . Colonosopy    . Hip fracture surgery    . Esophagogastroduodenoscopy N/A 05/21/2012    Procedure: ESOPHAGOGASTRODUODENOSCOPY (EGD);  Surgeon: Hilarie Fredrickson, MD;  Location: [facility] ENDOSCOPY;  Service: Endoscopy;  Laterality: N/A;  . Esophagogastroduodenoscopy (egd) with esophageal dilation N/A 08/22/2012    Procedure: ESOPHAGOGASTRODUODENOSCOPY (EGD) WITH ESOPHAGEAL DILATION;  Surgeon: Hilarie Fredrickson, MD;  Location: [facility] ENDOSCOPY;  Service: Endoscopy;  Laterality: N/A;  . Esophagogastroduodenoscopy N/A 09/27/2012    Procedure: ESOPHAGOGASTRODUODENOSCOPY (EGD);  Surgeon: Iva Boop, MD;  Location: [facility] ENDOSCOPY;  Service: Endoscopy;  Laterality: N/A;  . Cardioversion N/A 10/06/2012    Procedure: CARDIOVERSION;  Surgeon: Laurey Morale, MD;  Location: [facility] ENDOSCOPY;  Service: Cardiovascular;  Laterality: N/A;    Loyce Dys, MS RD LDN Clinical Inpatient Dietitian Pager: [phone] Weekend/After hours pager: [phone]

## 2012-11-01 NOTE — Progress Notes (Signed)
Subjective: Feels better.  Less dizzy.  Still requiring some assistance when transferring.  Eating and drinking well.    Objective: Vital signs in last 24 hours: Temp:  [97.5 F (36.4 C)-98.2 F (36.8 C)] 98.2 F (36.8 C) (10/07 0351) Pulse Rate:  [59-105] 105 (10/07 0351) Resp:  [18] 18 (10/07 0351) BP: (124-171)/(48-116) 138/89 mmHg (10/07 0351) SpO2:  [96 %-98 %] 96 % (10/07 0351) Weight:  [54.1 kg (119 lb 4.3 oz)-55.339 kg (122 lb)] 54.1 kg (119 lb 4.3 oz) (10/07 0351) Weight change:  Last BM Date: 10/31/12  CBG (last 3)  No results found for this basename: GLUCAP,  in the last 72 hours  Intake/Output from previous day: 10/06 0701 - 10/07 0700 In: 687.5 [I.V.:687.5] Out: 150 [Urine:150] Intake/Output this shift:    General appearance: alert and more comfortable, able to sit up without dizziness Eyes: no scleral icterus Throat: oropharynx moist without erythema Resp: bilateral basilar crackles unchanged Cardio: regular rate and rhythm and with frequent ectopy GI: soft, non-tender; bowel sounds normal; no masses,  no organomegaly Extremities: no clubbing, cyanosis or edema; decreased skin turgor   Lab Results:  Recent Labs  10/31/12 1600  NA 142  K 3.8  CL 104  CO2 27  GLUCOSE 152*  BUN 27*  CREATININE 1.89*  CALCIUM 9.7    Recent Labs  10/31/12 1600  AST 26  ALT 13  ALKPHOS 46  BILITOT 0.5  PROT 6.5  ALBUMIN 3.4*    Recent Labs  10/31/12 1600  WBC 5.3  HGB 12.1  HCT 37.8  MCV 95.2  PLT 158   Lab Results  Component Value Date   INR 1.11 01/28/2010   INR 1.09 12/19/2008   BNP    Component Value Date/Time   PROBNP 16383.0* 10/31/2012 1600     Studies/Results: X-ray Chest Pa And Lateral   11/01/2012   *RADIOLOGY REPORT*  Clinical Data: Follow up congestive heart failure.  CHEST - 2 VIEW  Comparison: Chest radiograph performed 10/27/2012  Findings: The lungs are hyperexpanded, with flattening of the hemidiaphragms, compatible with COPD.   Chronic interstitial prominence is again noted, with bronchitic changes.  Mild vascular congestion is noted.  There is no evidence of pleural effusion or pneumothorax.  The heart is normal in size; the mediastinal contour is within normal limits.  No acute osseous abnormalities are seen.  Multiple chronic compression deformities are again noted along the lower thoracic spine.  IMPRESSION: Findings of COPD, with chronic lung changes again noted.  Mild cardiomegaly.  Mild vascular congestion, without definite evidence of pulmonary edema.   Original Report Authenticated By: Tonia Ghent, M.D.     Medications: Scheduled: . apixaban  2.5 mg Oral BID  . cholecalciferol  400 Units Oral Daily  . cyanocobalamin  500 mcg Oral Daily  . iron polysaccharides  150 mg Oral Q breakfast  . metoprolol succinate  50 mg Oral Daily  . mirabegron ER  25 mg Oral Daily  . multivitamin with minerals  1 tablet Oral Daily  . pantoprazole  40 mg Oral Daily  . sodium chloride  3 mL Intravenous Q12H   Continuous: . sodium chloride 50 mL/hr at 10/31/12 1615    Assessment/Plan: Principal Problem: 1. Dizziness- improved this am with medications adjustments and gentle hydration.  Check orthostatics again.  Continue to hold Amiodarone, Lasix, hydralazine, and Imdur.  Unclear which is the culprit but can restart sequentially if needed and monitor for recurrent symptoms.    Active Problems: 2.  Systolic CHF, chronic- BNP elevated but no signs of volume overload.  Will discontinue IV fluids and monitor off of Lasix. 3. Paroxysmal atrial fibrillation- remains in NSR with PACs after recent DCCV.  Continue Toprol and Eliquis.  Risk of recurrent Afib off of Amiodarone but may be contributing to dizziness. 4. Chronic renal insufficiency, stage IV (severe)- stable.  Monitor without diuresis. 5. Recurrent falls- PT/OT evaluation.  Fall precautions. 6. HYPERTENSION- monitor for increased BP off Hydralazine and Imdur.   7. Overactive  Bladder- may have to stop Mybetriq if BP increases. 8. Disposition- possible discharge tomorrow if continued improvement with medication changes.  PT/OT evaluation for discharge recs- FL2 signed in the event that she has to return to SNF though I am hopeful she can return to independent apartment at [place].      LOS: 1 day   SHAW,W DOUGLAS 11/01/2012, 7:04 AM

## 2012-11-02 LAB — CBC
HCT: 39.4 % (ref 36.0–46.0)
Hemoglobin: 13.2 g/dL (ref 12.0–15.0)
MCHC: 33.5 g/dL (ref 30.0–36.0)
MCV: 94 fL (ref 78.0–100.0)
Platelets: 173 10*3/uL (ref 150–400)
RBC: 4.19 MIL/uL (ref 3.87–5.11)
RDW: 14.2 % (ref 11.5–15.5)
WBC: 10.1 10*3/uL (ref 4.0–10.5)

## 2012-11-02 LAB — URINE CULTURE

## 2012-11-02 LAB — BASIC METABOLIC PANEL
BUN: 30 mg/dL — ABNORMAL HIGH (ref 6–23)
Chloride: 103 mEq/L (ref 96–112)
GFR calc Af Amer: 31 mL/min — ABNORMAL LOW (ref >90)
GFR calc non Af Amer: 27 mL/min — ABNORMAL LOW (ref >90)
Potassium: 4.6 mEq/L (ref 3.5–5.1)
Sodium: 139 mEq/L (ref 135–145)

## 2012-11-02 LAB — PRO B NATRIURETIC PEPTIDE: Pro B Natriuretic peptide (BNP): 24260 pg/mL — ABNORMAL HIGH (ref 0–450)

## 2012-11-02 MED ORDER — FUROSEMIDE 10 MG/ML IJ SOLN
40.0000 mg | Freq: Once | INTRAMUSCULAR | Status: AC
  Administered 2012-11-02: 40 mg via INTRAVENOUS
  Filled 2012-11-02: qty 4

## 2012-11-02 MED ORDER — CIPROFLOXACIN HCL 250 MG PO TABS
250.0000 mg | ORAL_TABLET | Freq: Two times a day (BID) | ORAL | Status: DC
  Administered 2012-11-02 – 2012-11-04 (×5): 250 mg via ORAL
  Filled 2012-11-02 (×7): qty 1

## 2012-11-02 NOTE — Progress Notes (Addendum)
12:00pm: [facility] has confirmed a bed for dc. CSW informed family and they stated they wanted to go ahead and pursue dc with [facility].  Clinical Social Worker provided patient (and family at beside) with skilled nursing facility bed offers. Patient appears open to SNF option and gave CSW permission to follow up with [facility]. Patient stated she's been there before and really likes that facility. CSW followed up with [facility], left message with Admissions Coordinator and is awaiting phone call.  Will update further later  Maree Krabbe, MSW, [employer] [phone]

## 2012-11-02 NOTE — Progress Notes (Signed)
Advanced Heart Failure Rounding Note   Subjective:   Admitted due to dizziness. Amiodarone stopped. Placed on IV fluids.    Complaining of SOB at rest.      Objective:   Weight Range:  Vital Signs:   Temp:  [97.5 F (36.4 C)-98 F (36.7 C)] 97.5 F (36.4 C) (10/08 0544) Pulse Rate:  [55-108] 89 (10/08 0544) Resp:  [18] 18 (10/08 0544) BP: (133-168)/(65-84) 168/82 mmHg (10/08 0544) SpO2:  [90 %-99 %] 90 % (10/08 0557) Weight:  [123 lb 7.3 oz (56 kg)] 123 lb 7.3 oz (56 kg) (10/08 0544) Last BM Date: 10/31/12  Weight change: Filed Weights   10/31/12 1726 11/01/12 0351 11/02/12 0544  Weight: 122 lb (55.339 kg) 119 lb 4.3 oz (54.1 kg) 123 lb 7.3 oz (56 kg)    Intake/Output:   Intake/Output Summary (Last 24 hours) at 11/02/12 0718 Last data filed at 11/01/12 2102  Gross per 24 hour  Intake    423 ml  Output    201 ml  Net    222 ml     Physical Exam: General:  Elderly  appearing. SOB at rest HEENT: normal Neck: supple. JVP to jaw. Carotids 2+ bilat; no bruits. No lymphadenopathy or thryomegaly appreciated. Cor: PMI nondisplaced. Regular rate & rhythm. No rubs, gallops or murmurs. Lungs: RLL LLL crackles. Abdomen: soft, nontender, nondistended. No hepatosplenomegaly. No bruits or masses. Good bowel sounds. Extremities: no cyanosis, clubbing, rash, edema Neuro: alert & orientedx3, cranial nerves grossly intact. moves all 4 extremities w/o difficulty. Affect pleasant  Telemetry: NSR  Labs: Basic Metabolic Panel:  Recent Labs Lab 10/27/12 1150 10/31/12 1600 11/01/12 0815 11/02/12 0522  NA 140 142 140 139  K 4.3 3.8 4.3 4.6  CL 104 104 104 103  CO2 25 27 27 23   GLUCOSE 97 152* 110* 176*  BUN 20 27* 26* 30*  CREATININE 1.79* 1.89* 1.71* 1.58*  CALCIUM 9.2 9.7 9.7 9.5    Liver Function Tests:  Recent Labs Lab 10/31/12 1600  AST 26  ALT 13  ALKPHOS 46  BILITOT 0.5  PROT 6.5  ALBUMIN 3.4*   No results found for this basename: LIPASE, AMYLASE,  in  the last 168 hours No results found for this basename: AMMONIA,  in the last 168 hours  CBC:  Recent Labs Lab 10/27/12 1150 10/31/12 1600 11/02/12 0522  WBC 5.4 5.3 10.1  NEUTROABS 3.9  --   --   HGB 11.7* 12.1 13.2  HCT 36.2 37.8 39.4  MCV 95.0 95.2 94.0  PLT 131* 158 173    Cardiac Enzymes: No results found for this basename: CKTOTAL, CKMB, CKMBINDEX, TROPONINI,  in the last 168 hours  BNP: BNP (last 3 results)  Recent Labs  10/31/12 1600 11/01/12 0815 11/02/12 0522  PROBNP 16383.0* 19847.0* 24260.0*     Other results:  EKG:   Imaging: X-ray Chest Pa And Lateral   11/01/2012   *RADIOLOGY REPORT*  Clinical Data: Follow up congestive heart failure.  CHEST - 2 VIEW  Comparison: Chest radiograph performed 10/27/2012  Findings: The lungs are hyperexpanded, with flattening of the hemidiaphragms, compatible with COPD.  Chronic interstitial prominence is again noted, with bronchitic changes.  Mild vascular congestion is noted.  There is no evidence of pleural effusion or pneumothorax.  The heart is normal in size; the mediastinal contour is within normal limits.  No acute osseous abnormalities are seen.  Multiple chronic compression deformities are again noted along the lower thoracic spine.  IMPRESSION: Findings  of COPD, with chronic lung changes again noted.  Mild cardiomegaly.  Mild vascular congestion, without definite evidence of pulmonary edema.   Original Report Authenticated By: Tonia Ghent, M.D.     Medications:     Scheduled Medications: . apixaban  2.5 mg Oral BID  . cholecalciferol  400 Units Oral Daily  . ciprofloxacin  250 mg Oral BID  . cyanocobalamin  500 mcg Oral Daily  . feeding supplement (ENSURE COMPLETE)  237 mL Oral Q24H  . furosemide  40 mg Intravenous Once  . iron polysaccharides  150 mg Oral Q breakfast  . metoprolol succinate  50 mg Oral Daily  . mirabegron ER  25 mg Oral Daily  . multivitamin with minerals  1 tablet Oral Daily  .  pantoprazole  40 mg Oral Daily  . sodium chloride  3 mL Intravenous Q12H    Infusions:    PRN Medications: acetaminophen, acetaminophen, ondansetron (ZOFRAN) IV, ondansetron, polyethylene glycol   Assessment/Plan Discussion   1. Dizziness: ? Related to amiodarone (not uncommon CNS side effect).  Hydralazine stopped 10/27/12 Imdur and lasix stopped today by PCP.  Yesterday IV fluids stopped.   2. A fib : S/P successful DC-CV Maintaining SR. Continue renal dose apixaban.  Amiodarone has been stopped as there is ~10% chance it can cause the dizziness.   3. Chronic systolic CHF: EF 04-54% on last echo. Volume elevated. Weight up 4 pounds. Receiving 40 mg IV lasix now. Hopefully can transition to po tomorrow.  Continue metoprolol succinate 50 mg daily. She will not be placed on ace or spiro due to CKD. Keep off hydralazine and Imdur for now.  4. CKD: creatinine baseline 2.1-2.2, creatinine 1.58 today.   Length of Stay: 2   CLEGG,AMY 11/02/2012, 7:18 AM  Advanced Heart Failure Team Pager [phone] (M-F; 7a - 4p)  Please contact [facility] for night-coverage after hours (4p -7a ) and weekends on [email or web address]  Patient seen with NP, agree with the above note.  Now patient is volume overloaded on exam after holding Lasix and getting IV fluid.  Narrow therapeutic window.  Would give 2 doses of IV Lasix today, possibly resume po tomorrow.  Creatinine is low for her.  Concern that dizziness may be related to amiodarone (not uncommon neurologic side effect) so now off of it.  Treating UTI with Cipro.  Some delirium, will need to watch closely, may improve with treatment of CHF.   [name] 11/02/2012

## 2012-11-02 NOTE — Progress Notes (Signed)
Subjective: Increased shortness of breath this am.  Now wearing O2.  Dizziness is better though she can't remember how she felt yesterday.  Feels weak.  Objective: Vital signs in last 24 hours: Temp:  [97.5 F (36.4 C)-98 F (36.7 C)] 97.5 F (36.4 C) (10/08 0544) Pulse Rate:  [55-108] 89 (10/08 0544) Resp:  [18] 18 (10/08 0544) BP: (133-168)/(65-84) 168/82 mmHg (10/08 0544) SpO2:  [90 %-99 %] 90 % (10/08 0557) Weight:  [56 kg (123 lb 7.3 oz)] 56 kg (123 lb 7.3 oz) (10/08 0544) Weight change: 0.661 kg (1 lb 7.3 oz) Last BM Date: 10/31/12  CBG (last 3)  No results found for this basename: GLUCAP,  in the last 72 hours  Intake/Output from previous day: 10/07 0701 - 10/08 0700 In: 423 [P.O.:420; I.V.:3] Out: 201 [Urine:200; Stool:1] Intake/Output this shift:    General appearance: alert and tachypneic, speaking in 2-3 word phrases Eyes: no scleral icterus Throat: oropharynx moist without erythema Resp: bilateral crackles 1/2 way up Cardio: regular rate and rhythm and frequent ectopy GI: soft, non-tender; bowel sounds normal; no masses,  no organomegaly Extremities: no clubbing, cyanosis or edema   Lab Results:  Recent Labs  11/01/12 0815 11/02/12 0522  NA 140 139  K 4.3 4.6  CL 104 103  CO2 27 23  GLUCOSE 110* 176*  BUN 26* 30*  CREATININE 1.71* 1.58*  CALCIUM 9.7 9.5    Recent Labs  10/31/12 1600  AST 26  ALT 13  ALKPHOS 46  BILITOT 0.5  PROT 6.5  ALBUMIN 3.4*    Recent Labs  10/31/12 1600 11/02/12 0522  WBC 5.3 10.1  HGB 12.1 13.2  HCT 37.8 39.4  MCV 95.2 94.0  PLT 158 173   Lab Results  Component Value Date   INR 1.11 01/28/2010   INR 1.09 12/19/2008    Studies/Results: X-ray Chest Pa And Lateral   11/01/2012   *RADIOLOGY REPORT*  Clinical Data: Follow up congestive heart failure.  CHEST - 2 VIEW  Comparison: Chest radiograph performed 10/27/2012  Findings: The lungs are hyperexpanded, with flattening of the hemidiaphragms, compatible  with COPD.  Chronic interstitial prominence is again noted, with bronchitic changes.  Mild vascular congestion is noted.  There is no evidence of pleural effusion or pneumothorax.  The heart is normal in size; the mediastinal contour is within normal limits.  No acute osseous abnormalities are seen.  Multiple chronic compression deformities are again noted along the lower thoracic spine.  IMPRESSION: Findings of COPD, with chronic lung changes again noted.  Mild cardiomegaly.  Mild vascular congestion, without definite evidence of pulmonary edema.   Original Report Authenticated By: Tonia Ghent, M.D.     Medications: Scheduled: . apixaban  2.5 mg Oral BID  . cholecalciferol  400 Units Oral Daily  . ciprofloxacin  250 mg Oral BID  . cyanocobalamin  500 mcg Oral Daily  . feeding supplement (ENSURE COMPLETE)  237 mL Oral Q24H  . iron polysaccharides  150 mg Oral Q breakfast  . metoprolol succinate  50 mg Oral Daily  . mirabegron ER  25 mg Oral Daily  . multivitamin with minerals  1 tablet Oral Daily  . pantoprazole  40 mg Oral Daily  . sodium chloride  3 mL Intravenous Q12H   Continuous:   Assessment/Plan: Principal Problems: 1. Systolic CHF, acute on chronic- she now appears to have more acute CHF symptoms with increased shortness of breath, decreased sats/O2 requirement, increasing BNP.  Will give Lasix IV X  1 and then resume po Lasix tomorrow if improved.  Challenging situation balancing medications for CHF with side effects of meds.  Avoid overdiuresis.   2. Orthostatic Dizziness- seems to have improved though limited ambulation.  Unclear which medication is the culprit but suspect it was related to either Amiodarone (long t 1/2 makes this less likely), Hydralazine (stopped prior to admission), Lasix/mild volume depletion or Imdur.  Monitor with restarting Lasix and with ambulation.  Active Problems: 3. HYPERTENSION- BP trending up. Monitor with diuresis. 4. Paroxysmal atrial  fibrillation- remains in SR with PACs.  Continue Eliquis, toprol. 5. Chronic renal insufficiency, stage IV (severe)- stable. 6. Recurrent falls- continue PT/OT. 7. Malnutrition of moderate degree- add Ensure 8. Disposition- requires additional inpatient care.  Hopeful for discharge in 1-2 days if stable.   LOS: 2 days   Izzak Fries,W DOUGLAS 11/02/2012, 7:08 AM

## 2012-11-03 ENCOUNTER — Inpatient Hospital Stay (HOSPITAL_COMMUNITY): Payer: Medicare Other

## 2012-11-03 DIAGNOSIS — I5023 Acute on chronic systolic (congestive) heart failure: Secondary | ICD-10-CM | POA: Diagnosis present

## 2012-11-03 LAB — BASIC METABOLIC PANEL
CO2: 28 mEq/L (ref 19–32)
Calcium: 9.4 mg/dL (ref 8.4–10.5)
GFR calc Af Amer: 25 mL/min — ABNORMAL LOW (ref >90)
GFR calc non Af Amer: 21 mL/min — ABNORMAL LOW (ref >90)
Glucose, Bld: 150 mg/dL — ABNORMAL HIGH (ref 70–99)
Potassium: 3.7 mEq/L (ref 3.5–5.1)
Sodium: 140 mEq/L (ref 135–145)

## 2012-11-03 LAB — CBC
HCT: 37.2 % (ref 36.0–46.0)
Hemoglobin: 12.7 g/dL (ref 12.0–15.0)
MCHC: 34.1 g/dL (ref 30.0–36.0)
Platelets: 142 10*3/uL — ABNORMAL LOW (ref 150–400)
WBC: 6 10*3/uL (ref 4.0–10.5)

## 2012-11-03 LAB — PRO B NATRIURETIC PEPTIDE: Pro B Natriuretic peptide (BNP): 28879 pg/mL — ABNORMAL HIGH (ref 0–450)

## 2012-11-03 MED ORDER — FUROSEMIDE 40 MG PO TABS
40.0000 mg | ORAL_TABLET | Freq: Every day | ORAL | Status: DC
  Administered 2012-11-03: 40 mg via ORAL
  Filled 2012-11-03 (×2): qty 1

## 2012-11-03 NOTE — Progress Notes (Signed)
Occupational Therapy Treatment Patient Details Name: Melissa Robinson MRN: [id number] DOB: 04-26-1916 Today's Date: 11/03/2012 Time: 7829-5621 OT Time Calculation (min): 17 min  OT Assessment / Plan / Recommendation  History of present illness Has fallen twice in past 2 weeks- first time when getting out of bathtub; 2nd time dizzy when standing up and fell.  Went to ER both times, most recently 10/2 where head CT negative, labs normal.  Evaluated by Cardiology who recommended that she stop Hydralazine at that time.  Has been wearing heart monitor since 10/3.  Remains very weak.  Today feels even worse with increased dizziness when standing up or leaning over.     OT comments  BP 140/79 in standing.  Pt proceeded to perform grooming standing at sink with min A.  She is very unsteady today with dyspnea 2-3/4 with simple activity.  Pt with intermittent memory deficits today - has no recollection of working with PT, or BP dropping, or dizziness.   Follow Up Recommendations  SNF;Supervision/Assistance - 24 hour    Barriers to Discharge       Equipment Recommendations  None recommended by OT    Recommendations for Other Services    Frequency Min 2X/week   Progress towards OT Goals Progress towards OT goals: Not progressing toward goals - comment (Pt unsteady today, and fatigued)  Plan Discharge plan remains appropriate    Precautions / Restrictions Precautions Precautions: Fall;Other (comment) Precaution Comments: Orthostasis Restrictions Weight Bearing Restrictions: No   Pertinent Vitals/Pain     ADL  Grooming: Minimal assistance;Wash/dry face;Wash/dry hands;Denture care;Teeth care Where Assessed - Grooming: Supported standing Upper Body Dressing: Moderate assistance (robe) Where Assessed - Upper Body Dressing: Unsupported sit to stand Equipment Used: Rolling walker Transfers/Ambulation Related to ADLs: min A with rolling walker.  Dyspnea 3/4.  Min verbal cues for walker safety ADL  Comments: Pt with BP 308/65 in standing, no c/o dizziness.  Pt very unsteady, and trembling today. Bouncing  movements noted.  Pt stood at sink to perform grooming.  Dyspnea 2-3/4.  Pt fatigued and returned to chair.      OT Diagnosis:    OT Problem List:   OT Treatment Interventions:     OT Goals(current goals can now be found in the care plan section) ADL Goals Pt Will Perform Grooming: with supervision;standing Pt Will Perform Upper Body Bathing: with set-up;sitting Pt Will Perform Lower Body Bathing: with supervision;sit to/from stand Pt Will Perform Upper Body Dressing: with set-up;sitting Pt Will Perform Lower Body Dressing: with supervision;sit to/from stand Pt Will Transfer to Toilet: with supervision;regular height toilet;grab bars;ambulating Pt Will Perform Toileting - Clothing Manipulation and hygiene: with supervision;sit to/from stand  Visit Information  Last OT Received On: 11/03/12 Assistance Needed: +1 History of Present Illness: Has fallen twice in past 2 weeks- first time when getting out of bathtub; 2nd time dizzy when standing up and fell.  Went to ER both times, most recently 10/2 where head CT negative, labs normal.  Evaluated by Cardiology who recommended that she stop Hydralazine at that time.  Has been wearing heart monitor since 10/3.  Remains very weak.  Today feels even worse with increased dizziness when standing up or leaning over.      Subjective Data      Prior Functioning       Cognition  Cognition Arousal/Alertness: Awake/alert Behavior During Therapy: WFL for tasks assessed/performed Overall Cognitive Status: Impaired/Different from baseline Area of Impairment: Memory Memory: Decreased short-term memory General Comments: Pt able  to recall conversation with supervor from [place] [facility], but does not recall PT working with her, the need for EKG, or BP dropping and dizziness.  She states, "I haven't had any therapy in over a week".  Even with prompting,  pt unable to recall events    Mobility  Bed Mobility Bed Mobility: Not assessed Supine to Sit: 3: Mod assist;HOB elevated Details for Bed Mobility Assistance: assist for trunk upright and scoot to EOB Transfers Transfers: Sit to Stand;Stand to Sit Sit to Stand: 3: Mod assist;With upper extremity assist;From chair/3-in-1 Stand to Sit: 4: Min assist;With upper extremity assist;To chair/3-in-1 Details for Transfer Assistance: Pt requires assist to move into standing position    Exercises  General Exercises - Lower Extremity Ankle Circles/Pumps: AROM;Both;10 reps Long Arc Quad: AROM;Both;15 reps;Seated Hip ABduction/ADduction: AROM;Both;15 reps;Supine Straight Leg Raises: AROM;Both;15 reps;Supine Hip Flexion/Marching: AROM;Both;15 reps;Seated   Balance Balance Balance Assessed: Yes Dynamic Standing Balance Dynamic Standing - Balance Support: No upper extremity supported Dynamic Standing - Level of Assistance: 4: Min assist Dynamic Standing - Balance Activities: Other (comment) (grooming at sink )   End of Session OT - End of Session Equipment Utilized During Treatment: Rolling walker Activity Tolerance: Patient limited by lethargy Patient left: in chair;with call bell/phone within reach;with chair alarm set;with family/visitor present Nurse Communication: Mobility status  GO     Kaitlin Ardito M 11/03/2012, 4:24 PM

## 2012-11-03 NOTE — Progress Notes (Signed)
Physical Therapy Treatment Patient Details Name: Melissa Robinson MRN: [id number] DOB: 11-15-1916 Today's Date: 11/03/2012 Time: 4098-1191 PT Time Calculation (min): 27 min  PT Assessment / Plan / Recommendation  History of Present Illness Has fallen twice in past 2 weeks- first time when getting out of bathtub; 2nd time dizzy when standing up and fell.  Went to ER both times, most recently 10/2 where head CT negative, labs normal.  Evaluated by Cardiology who recommended that she stop Hydralazine at that time.  Has been wearing heart monitor since 10/3.  Remains very weak.  Today feels even worse with increased dizziness when standing up or leaning over.     PT Comments   Pt agreeable to perform OOB to chair, unable to ambulate today due to drop in BP upon standing (assisted nsg tech with orthostatics) and pt reporting increased dizziness.  Pt performed exercises in recliner and states she feels fine however appeared very fatigued so took multiple short rest breaks.   Follow Up Recommendations  SNF     Does the patient have the potential to tolerate intense rehabilitation     Barriers to Discharge        Equipment Recommendations  None recommended by PT    Recommendations for Other Services    Frequency Min 3X/week   Progress towards PT Goals Progress towards PT goals: Not progressing toward goals - comment (drop in BP with standing)  Plan Current plan remains appropriate    Precautions / Restrictions Precautions Precautions: Fall   Pertinent Vitals/Pain Orthostatics per nsg tech SaO2 94% room air and HR 63 bpm after session    Mobility  Bed Mobility Bed Mobility: Supine to Sit Supine to Sit: 3: Mod assist;HOB elevated Details for Bed Mobility Assistance: assist for trunk upright and scoot to EOB Transfers Transfers: Sit to Stand;Stand to Sit;Stand Pivot Transfers Sit to Stand: 3: Mod assist;With upper extremity assist;From bed;From chair/3-in-1 Stand to Sit: 3: Mod  assist;With upper extremity assist;To bed;To chair/3-in-1 Stand Pivot Transfers: 3: Mod assist Details for Transfer Assistance: assisted nsg tech with orthostatics with significant drop in BP upon standing and pt reporting dizziness so assisted back to sitting, pt took rest break then agreeable to stand pivot to recliner (as she states she has not been OOB today) Ambulation/Gait Ambulation/Gait Assistance: Not tested (comment) (pt felt unable, unsafe at this time due to drop in BP on standing)    Exercises General Exercises - Lower Extremity Ankle Circles/Pumps: AROM;Both;10 reps Long Arc Quad: AROM;Both;15 reps;Seated Hip ABduction/ADduction: AROM;Both;15 reps;Supine Straight Leg Raises: AROM;Both;15 reps;Supine Hip Flexion/Marching: AROM;Both;15 reps;Seated   PT Diagnosis:    PT Problem List:   PT Treatment Interventions:     PT Goals (current goals can now be found in the care plan section)    Visit Information  Last PT Received On: 11/03/12 Assistance Needed: +2 History of Present Illness: Has fallen twice in past 2 weeks- first time when getting out of bathtub; 2nd time dizzy when standing up and fell.  Went to ER both times, most recently 10/2 where head CT negative, labs normal.  Evaluated by Cardiology who recommended that she stop Hydralazine at that time.  Has been wearing heart monitor since 10/3.  Remains very weak.  Today feels even worse with increased dizziness when standing up or leaning over.      Subjective Data      Cognition  Cognition Arousal/Alertness: Awake/alert Behavior During Therapy: WFL for tasks assessed/performed Overall Cognitive Status: Within Functional Limits  for tasks assessed    Balance     End of Session PT - End of Session Activity Tolerance: Patient limited by fatigue (limited by dizziness) Patient left: in chair;with call bell/phone within reach;with chair alarm set   GP     Jordie Schreur,KATHrine E 11/03/2012, 2:03 PM Zenovia Jarred, PT,  DPT 11/03/2012 Pager: [phone]

## 2012-11-03 NOTE — Progress Notes (Signed)
OT Cancellation Note  Patient Details Name: MAANASA ADERHOLD MRN: [id number] DOB: January 26, 1917   Cancelled Treatment:    Reason Eval/Treat Not Completed: Patient at procedure or test/ unavailable - will try back as schedule allows  Boykin Reaper [phone] 11/03/2012, 11:53 AM

## 2012-11-03 NOTE — Progress Notes (Addendum)
11/03/2012 2:27 PM Nursing note RN noted orthostatics documentation in [facility]. Contacted Central Telemetry to confirm pt. HR during this time of vital sign collection since no tachy heart rate alerts were received by RN. According to Centralized Monitor technician pt. HR never rose above 100 during orthostatics collection. EKG performed per orders. Will continue to monitor patient.  Yakelin Grenier, Blanchard Kelch

## 2012-11-03 NOTE — Progress Notes (Signed)
Advanced Heart Failure Rounding Note   Subjective:   Admitted due to dizziness. Amiodarone stopped. Placed on IV fluids but became volume overloaded. Yesterday received IV lasix. Weight down 4 pounds. I/Os not accurate.   Denies dyspnea/orthopnea this morning, seems to be doing better.    Objective:   Weight Range:  Vital Signs:   Temp:  [97.5 F (36.4 C)-99 F (37.2 C)] 99 F (37.2 C) (10/09 0406) Pulse Rate:  [60-71] 71 (10/09 0406) Resp:  [16-20] 20 (10/09 0406) BP: (123-147)/(43-83) 147/83 mmHg (10/09 0406) SpO2:  [96 %-99 %] 98 % (10/09 0406) Weight:  [119 lb 11.4 oz (54.3 kg)] 119 lb 11.4 oz (54.3 kg) (10/09 0406) Last BM Date: 10/31/12  Weight change: Filed Weights   11/01/12 0351 11/02/12 0544 11/03/12 0406  Weight: 119 lb 4.3 oz (54.1 kg) 123 lb 7.3 oz (56 kg) 119 lb 11.4 oz (54.3 kg)    Intake/Output:   Intake/Output Summary (Last 24 hours) at 11/03/12 0700 Last data filed at 11/02/12 1700  Gross per 24 hour  Intake    240 ml  Output    600 ml  Net   -360 ml     Physical Exam: General:  Elderly  appearing. NAD.  HEENT: normal Neck: supple. JVP 7 cm Carotids 2+ bilat; no bruits. No lymphadenopathy or thryomegaly appreciated. Cor: PMI nondisplaced. Regular rate & rhythm. No rubs, gallops or murmurs. Lungs: Decreased RLL LLL  Abdomen: soft, nontender, nondistended. No hepatosplenomegaly. No bruits or masses. Good bowel sounds. Extremities: no cyanosis, clubbing, rash, edema Neuro: alert & orientedx3, cranial nerves grossly intact. moves all 4 extremities w/o difficulty. Affect pleasant  Telemetry: NSR  Labs: Basic Metabolic Panel:  Recent Labs Lab 10/27/12 1150 10/31/12 1600 11/01/12 0815 11/02/12 0522  NA 140 142 140 139  K 4.3 3.8 4.3 4.6  CL 104 104 104 103  CO2 25 27 27 23   GLUCOSE 97 152* 110* 176*  BUN 20 27* 26* 30*  CREATININE 1.79* 1.89* 1.71* 1.58*  CALCIUM 9.2 9.7 9.7 9.5    Liver Function Tests:  Recent Labs Lab  10/31/12 1600  AST 26  ALT 13  ALKPHOS 46  BILITOT 0.5  PROT 6.5  ALBUMIN 3.4*   No results found for this basename: LIPASE, AMYLASE,  in the last 168 hours No results found for this basename: AMMONIA,  in the last 168 hours  CBC:  Recent Labs Lab 10/27/12 1150 10/31/12 1600 11/02/12 0522  WBC 5.4 5.3 10.1  NEUTROABS 3.9  --   --   HGB 11.7* 12.1 13.2  HCT 36.2 37.8 39.4  MCV 95.0 95.2 94.0  PLT 131* 158 173    Cardiac Enzymes: No results found for this basename: CKTOTAL, CKMB, CKMBINDEX, TROPONINI,  in the last 168 hours  BNP: BNP (last 3 results)  Recent Labs  10/31/12 1600 11/01/12 0815 11/02/12 0522  PROBNP 16383.0* 19847.0* 24260.0*     Other results:  :   Imaging: No results found.   Medications:     Scheduled Medications: . apixaban  2.5 mg Oral BID  . cholecalciferol  400 Units Oral Daily  . ciprofloxacin  250 mg Oral BID  . cyanocobalamin  500 mcg Oral Daily  . feeding supplement (ENSURE COMPLETE)  237 mL Oral Q24H  . iron polysaccharides  150 mg Oral Q breakfast  . metoprolol succinate  50 mg Oral Daily  . mirabegron ER  25 mg Oral Daily  . multivitamin with minerals  1 tablet Oral  Daily  . pantoprazole  40 mg Oral Daily  . sodium chloride  3 mL Intravenous Q12H    Infusions:    PRN Medications: acetaminophen, acetaminophen, ondansetron (ZOFRAN) IV, ondansetron, polyethylene glycol   Assessment/Plan Discussion   1. Dizziness: ? Related to amiodarone (not uncommon CNS side effect). Hydralazine stopped 10/27/12. Imdur, amiodarone, and lasix stopped at admission.  These measures so far do not seem to have made much difference.  Lasix restarted yesterday.  Will need to limit meds as much as possible. 2. A fib : S/P successful DC-CV.  She remains in NSR today. Continue renal dose apixaban.  Amiodarone has been stopped as there is ~10% chance it can cause the dizziness.   3. Chronic systolic CHF: EF 65-78% on last echo. She looks better  today after IV Lasix, weight is down 4 lbs. Transition back to prior dose of po lasix today. Continue metoprolol succinate 50 mg daily. She will not be placed on ace or spiro due to CKD. Keep off hydralazine and Imdur for now.  4. CKD: Creatinine has been stable.  Pending this morning.  5. UTI: on Cipro. 6. Needs PT work today, will need SNF discharge.   Length of Stay: 3  [name] 11/03/2012 7:44 AM   Advanced Heart Failure Team Pager [phone] (M-F; 7a - 4p)  Please contact [facility] for night-coverage after hours (4p -7a ) and weekends on [email or web address]

## 2012-11-03 NOTE — Progress Notes (Signed)
Subjective: She feels better today with less shortness of breath.  Reports that dizziness was worse yesterday.  Has not noted any yet today but has not been out of bed.  Prefers to go to [facility] on discharge.  Objective: Vital signs in last 24 hours: Temp:  [97.5 F (36.4 C)-99 F (37.2 C)] 99 F (37.2 C) (10/09 0406) Pulse Rate:  [60-71] 71 (10/09 0406) Resp:  [16-20] 20 (10/09 0406) BP: (123-147)/(43-83) 147/83 mmHg (10/09 0406) SpO2:  [96 %-99 %] 98 % (10/09 0406) Weight:  [54.3 kg (119 lb 11.4 oz)] 54.3 kg (119 lb 11.4 oz) (10/09 0406) Weight change: -1.7 kg (-3 lb 12 oz) Last BM Date: 10/31/12  CBG (last 3)  No results found for this basename: GLUCAP,  in the last 72 hours  Intake/Output from previous day: 10/08 0701 - 10/09 0700 In: 240 [P.O.:240] Out: 600 [Urine:600] Intake/Output this shift: Total I/O In: -  Out: 650 [Urine:650]  General appearance: alert and no distress Eyes: no scleral icterus Throat: oropharynx moist without erythema Resp: decreased crackles- now in bilateral bases Cardio: regular rate and rhythm and with frequent ectopy GI: soft, non-tender; bowel sounds normal; no masses,  no organomegaly Extremities: no clubbing, cyanosis or edema Neuro: no focal deficits   Lab Results:  Recent Labs  11/01/12 0815 11/02/12 0522  NA 140 139  K 4.3 4.6  CL 104 103  CO2 27 23  GLUCOSE 110* 176*  BUN 26* 30*  CREATININE 1.71* 1.58*  CALCIUM 9.7 9.5    Recent Labs  10/31/12 1600  AST 26  ALT 13  ALKPHOS 46  BILITOT 0.5  PROT 6.5  ALBUMIN 3.4*    Recent Labs  11/02/12 0522 11/03/12 0802  WBC 10.1 6.0  HGB 13.2 12.7  HCT 39.4 37.2  MCV 94.0 93.0  PLT 173 142*   Lab Results  Component Value Date   INR 1.11 01/28/2010   INR 1.09 12/19/2008   No results found for this basename: CKTOTAL, CKMB, CKMBINDEX, TROPONINI,  in the last 72 hours No results found for this basename: TSH, T4TOTAL, FREET3, T3FREE, THYROIDAB,  in the last 72  hours No results found for this basename: VITAMINB12, FOLATE, FERRITIN, TIBC, IRON, RETICCTPCT,  in the last 72 hours  Studies/Results: No results found.   Medications: Scheduled: . apixaban  2.5 mg Oral BID  . cholecalciferol  400 Units Oral Daily  . ciprofloxacin  250 mg Oral BID  . cyanocobalamin  500 mcg Oral Daily  . feeding supplement (ENSURE COMPLETE)  237 mL Oral Q24H  . furosemide  40 mg Oral Daily  . iron polysaccharides  150 mg Oral Q breakfast  . metoprolol succinate  50 mg Oral Daily  . mirabegron ER  25 mg Oral Daily  . multivitamin with minerals  1 tablet Oral Daily  . pantoprazole  40 mg Oral Daily  . sodium chloride  3 mL Intravenous Q12H   Continuous:   Assessment/Plan: Principal Problem: 1. Dizziness- intermittent, positional dizziness likely related to medications (Amiodarone vs. Hydralazine vs. Imdur) vs. orthostasis related to diuresis.  Will obtain head CT to rule out CNS process though less likely.  Work with PT.  Recheck orthostatics. 2. Acute on Chronic CHF, systolic (EF 35%)- did not tolerate withholding lasix due to volume overload/worsening orthopnea (though she may have had less dizziness).  Improved with Lasix IV X 2 yesterday.  Transitioned to po lasix today. 3. Chronic renal insufficiency, stage IV (severe)- baseline creatinine ~2.0.  Monitor with diuresis. 4. HYPERTENSION- BP improved with diuresis. 5. Paroxysmal atrial fibrillation- remains in NSR after recent DCCV, now off Amiodarone.  Continue toprol and Eliquis. 6. Recurrent falls- PT/OT evaluation.   7. Malnutrition of moderate degree- continue Ensure. 8. UTI- continue cipro pending cultures though unclear if this is clinically significant. 8. Disposition- anticipate discharge to [facility].  PT/OT today and monitor with transition to po lasix.   10. Ethics- DNR    LOS: 3 days   Evy Lutterman,W DOUGLAS 11/03/2012, 8:58 AM

## 2012-11-04 LAB — CBC
Hemoglobin: 11.7 g/dL — ABNORMAL LOW (ref 12.0–15.0)
MCH: 30.9 pg (ref 26.0–34.0)
Platelets: 145 10*3/uL — ABNORMAL LOW (ref 150–400)
RBC: 3.79 MIL/uL — ABNORMAL LOW (ref 3.87–5.11)
WBC: 6 10*3/uL (ref 4.0–10.5)

## 2012-11-04 LAB — BASIC METABOLIC PANEL
CO2: 30 mEq/L (ref 19–32)
Chloride: 102 mEq/L (ref 96–112)
GFR calc Af Amer: 23 mL/min — ABNORMAL LOW (ref >90)
Potassium: 4.2 mEq/L (ref 3.5–5.1)
Sodium: 142 mEq/L (ref 135–145)

## 2012-11-04 LAB — PRO B NATRIURETIC PEPTIDE: Pro B Natriuretic peptide (BNP): 9780 pg/mL — ABNORMAL HIGH (ref 0–450)

## 2012-11-04 MED ORDER — METOPROLOL SUCCINATE ER 25 MG PO TB24
25.0000 mg | ORAL_TABLET | Freq: Two times a day (BID) | ORAL | Status: DC
Start: 2012-11-04 — End: 2012-11-04
  Administered 2012-11-04: 25 mg via ORAL
  Filled 2012-11-04 (×2): qty 1

## 2012-11-04 MED ORDER — CIPROFLOXACIN HCL 250 MG PO TABS: ORAL_TABLET | ORAL | Status: DC

## 2012-11-04 MED ORDER — METOPROLOL SUCCINATE ER 25 MG PO TB24: 25.0000 mg | ORAL_TABLET | Freq: Two times a day (BID) | ORAL | Status: DC

## 2012-11-04 MED ORDER — FUROSEMIDE 40 MG PO TABS: 40.0000 mg | ORAL_TABLET | ORAL | Status: DC

## 2012-11-04 MED ORDER — ENSURE COMPLETE PO LIQD: 237.0000 mL | ORAL | Status: AC

## 2012-11-04 NOTE — Progress Notes (Signed)
Clinical Social Worker facilitated patient discharge by contacting the patient, family and facility, [facility]. Patient agreeable to this plan and arranging transport via family. CSW will sign off, as social work intervention is no longer needed.  Maree Krabbe, MSW, Theresia Majors [phone]

## 2012-11-04 NOTE — Progress Notes (Signed)
Advanced Heart Failure Rounding Note   Subjective:   Admitted due to dizziness. Amiodarone stopped. Placed on IV fluids but became volume overloaded. Transitioned back to PO lasix yesterday. Weight down 3 pounds. I/Os not accurate. Very orthostatic and could not participate with PT.  Denies orthopnea, SOB or CP this am. Still complaining of dizziness.    Objective:   Weight Range:  Vital Signs:   Temp:  [97.5 F (36.4 C)-98.2 F (36.8 C)] 98.2 F (36.8 C) (10/10 0420) Pulse Rate:  [63-148] 65 (10/10 0420) Resp:  [18-22] 18 (10/10 0420) BP: (117-165)/(59-110) 134/62 mmHg (10/10 0420) SpO2:  [96 %-98 %] 97 % (10/10 0420) Weight:  [116 lb 13.5 oz (53 kg)] 116 lb 13.5 oz (53 kg) (10/10 0420) Last BM Date: 11/03/12  Weight change: Filed Weights   11/02/12 0544 11/03/12 0406 11/04/12 0420  Weight: 123 lb 7.3 oz (56 kg) 119 lb 11.4 oz (54.3 kg) 116 lb 13.5 oz (53 kg)    Intake/Output:   Intake/Output Summary (Last 24 hours) at 11/04/12 0718 Last data filed at 11/03/12 1700  Gross per 24 hour  Intake    480 ml  Output   1250 ml  Net   -770 ml     Physical Exam: General:  Elderly  appearing. NAD.  HEENT: normal Neck: supple. JVP appears flat cm Carotids 2+ bilat; no bruits. No lymphadenopathy or thryomegaly appreciated. Cor: PMI nondisplaced. Regular rate & rhythm. No rubs, gallops or murmurs. Lungs: Decreased RLL LLL  Abdomen: soft, nontender, nondistended. No hepatosplenomegaly. No bruits or masses. Good bowel sounds. Extremities: no cyanosis, clubbing, rash, edema Neuro: alert & orientedx3, cranial nerves grossly intact. moves all 4 extremities w/o difficulty. Affect pleasant  Telemetry: NSR  Labs: Basic Metabolic Panel:  Recent Labs Lab 10/31/12 1600 11/01/12 0815 11/02/12 0522 11/03/12 0802 11/04/12 0515  NA 142 140 139 140 142  K 3.8 4.3 4.6 3.7 4.2  CL 104 104 103 101 102  CO2 27 27 23 28 30   GLUCOSE 152* 110* 176* 150* 91  BUN 27* 26* 30* 34* 38*   CREATININE 1.89* 1.71* 1.58* 1.92* 2.01*  CALCIUM 9.7 9.7 9.5 9.4 9.3    Liver Function Tests:  Recent Labs Lab 10/31/12 1600  AST 26  ALT 13  ALKPHOS 46  BILITOT 0.5  PROT 6.5  ALBUMIN 3.4*   No results found for this basename: LIPASE, AMYLASE,  in the last 168 hours No results found for this basename: AMMONIA,  in the last 168 hours  CBC:  Recent Labs Lab 10/31/12 1600 11/02/12 0522 11/03/12 0802 11/04/12 0515  WBC 5.3 10.1 6.0 6.0  HGB 12.1 13.2 12.7 11.7*  HCT 37.8 39.4 37.2 35.6*  MCV 95.2 94.0 93.0 93.9  PLT 158 173 142* 145*    Cardiac Enzymes: No results found for this basename: CKTOTAL, CKMB, CKMBINDEX, TROPONINI,  in the last 168 hours  BNP: BNP (last 3 results)  Recent Labs  11/02/12 0522 11/03/12 0802 11/04/12 0515  PROBNP 24260.0* 40981.1* 9780.0*     Other results:  :   Imaging: Ct Head Wo Contrast  11/03/2012   CLINICAL DATA:  Dizziness for a few weeks. Blurry vision.  EXAM: CT HEAD WITHOUT CONTRAST  TECHNIQUE: Contiguous axial images were obtained from the base of the skull through the vertex without intravenous contrast.  COMPARISON:  10/27/2012.  FINDINGS: Left frontoparietal encephalomalacia is present compatible with an old left MCA infarct. No change from prior. No mass lesion, mass effect, midline shift,  hydrocephalus, hemorrhage. No acute territorial cortical ischemia/infarct. Atrophy and chronic ischemic white matter disease is present.Mucous retention cyst/ polyp is present in the right maxillary sinus, unchanged from the recent CT 10/27/2012. Small mucous retention cyst or polyp is present in the left anterior ethmoid air cells. Mastoid air cells are clear.  IMPRESSION: No interval change or acute intracranial abnormality. Atrophy, chronic ischemic white matter disease and left MCA distribution encephalomalacia.   Electronically Signed   By: Andreas Newport M.D.   On: 11/03/2012 14:31     Medications:     Scheduled  Medications: . apixaban  2.5 mg Oral BID  . cholecalciferol  400 Units Oral Daily  . ciprofloxacin  250 mg Oral BID  . cyanocobalamin  500 mcg Oral Daily  . feeding supplement (ENSURE COMPLETE)  237 mL Oral Q24H  . furosemide  40 mg Oral Daily  . iron polysaccharides  150 mg Oral Q breakfast  . metoprolol succinate  50 mg Oral Daily  . mirabegron ER  25 mg Oral Daily  . multivitamin with minerals  1 tablet Oral Daily  . pantoprazole  40 mg Oral Daily  . sodium chloride  3 mL Intravenous Q12H    Infusions:    PRN Medications: acetaminophen, acetaminophen, ondansetron (ZOFRAN) IV, ondansetron, polyethylene glycol   Assessment/Plan Discussion   1. Dizziness: ? Related to amiodarone (not uncommon CNS side effect). Hydralazine stopped 10/27/12. Imdur, amiodarone, and lasix stopped at admission.  These measures so far do not seem to have made much difference.  PO Lasix restarted yesterday, however she still continues to have dizziness and is quite orthostatic. 2. A fib : S/P successful DC-CV.  She remains in NSR today. Continue renal dose apixaban.  Amiodarone has been stopped as there is ~10% chance it can cause the dizziness.   3. Chronic systolic CHF: EF 16-10% on last echo. Transitioned to PO lasix yesterday and weight is down another 3 lbs. She is 116 lbs today and dry weight seems to be around 119. Will hold lasix today. She may need a regimen of 20 mg QOD. Continue metoprolol succinate 50 mg daily. She will not be placed on ace or spiro due to CKD. Will not restart hydralazine and Imdur for now with continued dizziness. 4. CKD: Creatinine up slightly this am. Will hold lasix today.  5. UTI: on Cipro. 6. Needs PT work today, will need SNF discharge.   Length of Stay: 4  Aundria Rud 11/04/2012 7:18 AM   Advanced Heart Failure Team Pager [phone] (M-F; 7a - 4p)  Please contact [facility] for night-coverage after hours (4p -7a ) and weekends on [email or web address]  Patient  seen with NP, agree with the above note.  Would change Toprol XL to 25 mg bid and would also try having her take Lasix 40 mg every other day.  She has a narrow therapeutic window.  Too little Lasix and she is short of breath, too much and she is orthostatic.  Will need close followup.  Can stay off hydralazine, Imdur, and amiodarone.  See me in office in 1 week.   [name] 11/04/2012 8:11 AM

## 2012-11-04 NOTE — Progress Notes (Addendum)
Clinical Social Work Department CLINICAL SOCIAL WORK PLACEMENT NOTE 11/04/2012  Patient:  Melissa Robinson, Melissa Robinson  Account Number:  [id number] Admit date:  10/31/2012  Clinical Social Worker:  Carren Rang  Date/time:  11/01/2012 12:49 PM  Clinical Social Work is seeking post-discharge placement for this patient at the following level of care:   SKILLED NURSING   (*CSW will update this form in Epic as items are completed)   10/31/2012  Patient/family provided with [facility] Department of Clinical Social Work's list of facilities offering this level of care within the geographic area requested by the patient (or if unable, by the patient's family).  10/31/2012  Patient/family informed of their freedom to choose among providers that offer the needed level of care, that participate in Medicare, Medicaid or managed care program needed by the patient, have an available bed and are willing to accept the patient.  10/31/2012  Patient/family informed of [facility]' ownership interest in [facility], as well as of the fact that they are under no obligation to receive care at this facility.  PASARR submitted to EDS on 01/29/2010 PASARR number received from [employer] on 01/29/2010  FL2 transmitted to all facilities in geographic area requested by pt/family on  11/01/2012 FL2 transmitted to all facilities within larger geographic area on   Patient informed that his/her managed care company has contracts with or will negotiate with  certain facilities, including the following:     Patient/family informed of bed offers received:  11/02/2012 Patient chooses bed at [facility] Physician recommends and patient chooses bed at    Patient to be transferred to [facility] on  11/04/2012 Patient to be transferred to facility by FAMILY  The following physician request were entered in Epic:   Additional Comments:

## 2012-11-04 NOTE — Discharge Summary (Addendum)
Physician Discharge Summary    Melissa Robinson  MR#: [id number]  DOB:[birth year]  Date of Admission: 10/31/2012 Date of Discharge: 11/04/2012  Attending Physician:Nason Conradt  Patient's UYQ:IHKV,Q [name], MD  Consults:Treatment Team:  Rounding Lbcardiology, MD    Discharge Diagnoses: Principal Problem:   Dizziness Active Problems:   HYPERTENSION   Paroxysmal atrial fibrillation   Chronic renal insufficiency, stage IV (severe)   Recurrent falls   Malnutrition of moderate degree   Acute on chronic systolic CHF (congestive heart failure)   Discharge Medications:   Medication List    STOP taking these medications       amiodarone 200 MG tablet  Commonly known as:  PACERONE      TAKE these medications       apixaban 2.5 MG Tabs tablet  Commonly known as:  ELIQUIS  Take 1 tablet (2.5 mg total) by mouth 2 (two) times daily.     CALCIUM-VITAMIN D PO  Take 1 tablet by mouth 2 (two) times daily.     cholecalciferol 400 UNITS Tabs tablet  Commonly known as:  VITAMIN D  Take 400 Units by mouth daily.     ciprofloxacin 250 MG tablet  Commonly known as:  CIPRO  Take 1 tab by mouth, BID, for 3 days     cyanocobalamin 500 MCG tablet  Take 500 mcg by mouth daily.     denosumab 60 MG/ML Soln injection  Commonly known as:  PROLIA  Inject 60 mg into the skin every 6 (six) months. Administer in upper arm, thigh, or abdomen     feeding supplement (ENSURE COMPLETE) Liqd  Take 237 mLs by mouth daily.     furosemide 40 MG tablet  Commonly known as:  LASIX  Take 1 tablet (40 mg total) by mouth every other day.     iron polysaccharides 150 MG capsule  Commonly known as:  NIFEREX  Take 150 mg by mouth daily.     metoprolol succinate 25 MG 24 hr tablet  Commonly known as:  TOPROL-XL  Take 1 tablet (25 mg total) by mouth 2 (two) times daily.     mirabegron ER 25 MG Tb24 tablet  Commonly known as:  MYRBETRIQ  Take 25 mg by mouth daily.     multivitamin with  minerals Tabs tablet  Take 1 tablet by mouth daily.     omeprazole 40 MG capsule  Commonly known as:  PRILOSEC  Tablet twice daily- 30 minutes before breakfast and supper     polyethylene glycol packet  Commonly known as:  MIRALAX / GLYCOLAX  Take 17 g by mouth daily as needed.        Hospital Procedures: X-ray Chest Pa And Lateral   11/01/2012   *RADIOLOGY REPORT*  Clinical Data: Follow up congestive heart failure.  CHEST - 2 VIEW  Comparison: Chest radiograph performed 10/27/2012  Findings: The lungs are hyperexpanded, with flattening of the hemidiaphragms, compatible with COPD.  Chronic interstitial prominence is again noted, with bronchitic changes.  Mild vascular congestion is noted.  There is no evidence of pleural effusion or pneumothorax.  The heart is normal in size; the mediastinal contour is within normal limits.  No acute osseous abnormalities are seen.  Multiple chronic compression deformities are again noted along the lower thoracic spine.  IMPRESSION: Findings of COPD, with chronic lung changes again noted.  Mild cardiomegaly.  Mild vascular congestion, without definite evidence of pulmonary edema.   Original Report Authenticated By: Tonia Ghent, M.D.   Dg  Chest 2 View  10/27/2012   CLINICAL DATA:  Fall, atrial fibrillation  EXAM: CHEST  2 VIEW  COMPARISON:  09/27/2012  FINDINGS: Cardiomegaly again noted. Prior vertebroplasty lower thoracic spine. Stable compression deformities lower thoracic spine. Degenerative changes bilateral shoulders. Stable chronic interstitial prominence and chronic bronchitic changes. No definite superimposed infiltrate. Stable old right rib fracture. No diagnostic pneumothorax.  IMPRESSION: No active cardiopulmonary disease. Stable chronic interstitial prominence and chronic bronchitic changes.   Electronically Signed   By: Natasha Mead   On: 10/27/2012 13:18   Ct Head Wo Contrast  11/03/2012   CLINICAL DATA:  Dizziness for a few weeks. Blurry vision.   EXAM: CT HEAD WITHOUT CONTRAST  TECHNIQUE: Contiguous axial images were obtained from the base of the skull through the vertex without intravenous contrast.  COMPARISON:  10/27/2012.  FINDINGS: Left frontoparietal encephalomalacia is present compatible with an old left MCA infarct. No change from prior. No mass lesion, mass effect, midline shift, hydrocephalus, hemorrhage. No acute territorial cortical ischemia/infarct. Atrophy and chronic ischemic white matter disease is present.Mucous retention cyst/ polyp is present in the right maxillary sinus, unchanged from the recent CT 10/27/2012. Small mucous retention cyst or polyp is present in the left anterior ethmoid air cells. Mastoid air cells are clear.  IMPRESSION: No interval change or acute intracranial abnormality. Atrophy, chronic ischemic white matter disease and left MCA distribution encephalomalacia.   Electronically Signed   By: Andreas Newport M.D.   On: 11/03/2012 14:31   Ct Head Wo Contrast  10/27/2012   CLINICAL DATA:  79 year old female status post fall. Blunt trauma. Initial encounter.  EXAM: CT HEAD WITHOUT CONTRAST  TECHNIQUE: Contiguous axial images were obtained from the base of the skull through the vertex without intravenous contrast.  COMPARISON:  None.  FINDINGS: Right maxillary sinus mucous retention cyst. Mild ethmoid opacification. No sinus fluid levels. Other Visualized paranasal sinuses and mastoids are clear.  Osteopenia. No scalp hematoma identified. No acute orbits soft tissue findings. Calvarium intact.  Insert calyx mild intracranial artery dolichoectasia. Small chronic appearing left posterior MCA infarct near the vertex. Patchy in confluent bilateral cerebral white matter hypodensity. Ventricle prominence appears to be ex vacuo related. No midline shift, mass effect, or evidence of intracranial mass lesion. No acute intracranial hemorrhage identified. No evidence of cortically based acute infarction identified. No suspicious  intracranial vascular hyperdensity.  IMPRESSION: Small posterior left MCA infarct, favor chronic. Superimposed nonspecific white matter changes. No acute traumatic injury identified.   Electronically Signed   By: Augusto Gamble M.D.   On: 10/27/2012 11:57    History of Present Illness: Has fallen twice in past 2 weeks- first time when getting out of bathtub; 2nd time dizzy when standing up and fell. Went to ER both times, most recently 10/2 where head CT negative, labs normal. Evaluated by Cardiology who recommended that she stop Hydralazine at that time. Has been wearing heart monitor since 10/3. Remains very weak. Today feels even worse with increased dizziness when standing up or leaning over.  Had cardioversion 2-3 weeks ago with successful conversion to NSR. Felt better initially after that but then developed increased weakness.   Hospital Course: 1. Dizziness-  Amiodarone, hydralazine, and Imdur stopped on admission given potential cause of dizziness. Metoprolol will be changed BID dosing from QD dosing. Lasix was attempted to be held, but not tolerated b/c developed volume overload w/o the lasix. Head CT showed NAICA. She continues to be orthostatic, but given her hx of CHF, holding  lasix is not an option. Extreme caution must be used during positional changes of the patient to avoid falls. Keep patient hydrated.  2. Acute on Chronic CHF, systolic (EF 35%)- did not tolerate withholding lasix due to volume overload/worsening orthopnea (though she may have had less dizziness). Tolerating 40mg  PO lasix daily w/ net neg I/Os but slight increase in Cr. Appears euvolemic on exam today. On room air today w/o issue. Clinically stable for discharge, but will decrease dosage of lasix to 40mg  QOD from QD and have f/u w/ Dr Shirlee Latch (cardiology) in 1 week. In addition, check BMP in 1 week to ensure Cr remains at baseline 3. Chronic renal insufficiency, stage IV (severe)- baseline creatinine ~2.0. Remains at baseline  throughout hospital stay. Check BMP in 1 week to ensure continues to remain at baseline on lasix 40mg  PO QOD.  4. HYPERTENSION- remains stable at 110-130s SBP and controlled on current regimen 5. Paroxysmal atrial fibrillation- now off of amiodarone. Continues on toprol and Eliquis. Remains rate controlled in sinus rhythm w/ PACs on EKGs and telemetry 6. Recurrent falls- SNF placement for rehab  7. Malnutrition of moderate degree- continue Ensure.  8. UTI- culture did not show specific isolated bacterial growth. Will treat w/ cipro 250mg  BID for total of 5 days, 3 days will be needed after discharge to SNF 8. Disposition- discharge to [facility] today.  10. ETHICS - DNR   Day of Discharge Exam BP 134/62  Pulse 65  Temp(Src) 98.2 F (36.8 C) (Oral)  Resp 18  Ht 5\' 5"  (1.651 m)  Wt 116 lb 13.5 oz (53 kg)  BMI 19.44 kg/m2  SpO2 97%  Physical Exam: General appearance: WF in NAD  Eyes: no scleral icterus Throat: oropharynx moist without erythema Resp:  CTAB, no wheezes or rales  Cardio: regularly, irregular rhythm (PACs)  w/o MRG  GI: soft, non-tender; bowel sounds normal; no masses,  no organomegaly Extremities: no clubbing, cyanosis or edema  Discharge Labs:  Recent Labs  11/03/12 0802 11/04/12 0515  NA 140 142  K 3.7 4.2  CL 101 102  CO2 28 30  GLUCOSE 150* 91  BUN 34* 38*  CREATININE 1.92* 2.01*  CALCIUM 9.4 9.3   No results found for this basename: AST, ALT, ALKPHOS, BILITOT, PROT, ALBUMIN,  in the last 72 hours  Recent Labs  11/03/12 0802 11/04/12 0515  WBC 6.0 6.0  HGB 12.7 11.7*  HCT 37.2 35.6*  MCV 93.0 93.9  PLT 142* 145*   Lab Results  Component Value Date   INR 1.11 01/28/2010   INR 1.09 12/19/2008   No results found for this basename: CKTOTAL, CKMB, CKMBINDEX, TROPONINI,  in the last 72 hours No results found for this basename: TSH, T4TOTAL, FREET3, T3FREE, THYROIDAB,  in the last 72 hours No results found for this basename: VITAMINB12, FOLATE,  FERRITIN, TIBC, IRON, RETICCTPCT,  in the last 72 hours  Discharge instructions: Discharge Orders   Future Appointments Provider Department Dept Phone   01/23/2013 9:15 AM Laurey Morale, MD [facility] [place] Office [phone]   03/24/2013 9:15 AM [facility] [place] Office [phone]   Future Orders Complete By Expires   Diet - low sodium heart healthy  As directed    Increase activity slowly  As directed      01-Home or Self Care   Disposition: SNF   Follow-up Appts: Follow-up with Dr. Clelia Croft at [facility] after discharge from SNF. Call for appt.  Ensure you have scheduled visit w/ Dr Shirlee Latch in 1 week   Condition on Discharge: stable   Tests Needing Follow-up: BMP in 1 week after discharge   Time spent in discharge (includes decision making & examination of pt): 45 minutes    Signed: Camya Haydon 11/04/2012, 8:28 AM

## 2012-11-09 ENCOUNTER — Non-Acute Institutional Stay (SKILLED_NURSING_FACILITY): Payer: Medicare Other | Admitting: Internal Medicine

## 2012-11-09 DIAGNOSIS — N184 Chronic kidney disease, stage 4 (severe): Secondary | ICD-10-CM

## 2012-11-09 DIAGNOSIS — I480 Paroxysmal atrial fibrillation: Secondary | ICD-10-CM

## 2012-11-09 DIAGNOSIS — I150 Renovascular hypertension: Secondary | ICD-10-CM

## 2012-11-09 DIAGNOSIS — I4891 Unspecified atrial fibrillation: Secondary | ICD-10-CM

## 2012-11-09 DIAGNOSIS — I509 Heart failure, unspecified: Secondary | ICD-10-CM

## 2012-11-11 ENCOUNTER — Encounter: Payer: Self-pay | Admitting: *Deleted

## 2012-11-15 ENCOUNTER — Encounter: Payer: Self-pay | Admitting: Cardiology

## 2012-11-15 ENCOUNTER — Ambulatory Visit (INDEPENDENT_AMBULATORY_CARE_PROVIDER_SITE_OTHER): Payer: Medicare Other | Admitting: Cardiology

## 2012-11-15 VITALS — BP 118/50 | HR 81 | Ht 65.0 in | Wt 116.0 lb

## 2012-11-15 DIAGNOSIS — I5022 Chronic systolic (congestive) heart failure: Secondary | ICD-10-CM

## 2012-11-15 DIAGNOSIS — I4891 Unspecified atrial fibrillation: Secondary | ICD-10-CM

## 2012-11-15 DIAGNOSIS — R42 Dizziness and giddiness: Secondary | ICD-10-CM

## 2012-11-15 DIAGNOSIS — R0602 Shortness of breath: Secondary | ICD-10-CM

## 2012-11-15 DIAGNOSIS — I5023 Acute on chronic systolic (congestive) heart failure: Secondary | ICD-10-CM

## 2012-11-15 DIAGNOSIS — I509 Heart failure, unspecified: Secondary | ICD-10-CM

## 2012-11-15 DIAGNOSIS — N189 Chronic kidney disease, unspecified: Secondary | ICD-10-CM

## 2012-11-15 LAB — BASIC METABOLIC PANEL
CO2: 27 mEq/L (ref 19–32)
Calcium: 9.2 mg/dL (ref 8.4–10.5)
Chloride: 106 mEq/L (ref 96–112)
Glucose, Bld: 141 mg/dL — ABNORMAL HIGH (ref 70–99)
Potassium: 3.8 mEq/L (ref 3.5–5.1)
Sodium: 142 mEq/L (ref 135–145)

## 2012-11-15 NOTE — Patient Instructions (Signed)
Your physician recommends that you have lab work today--BMET/BNP.  Your physician recommends that you schedule a follow-up appointment in: 1 month with Dr Shirlee Latch. This is scheduled for Tuesday December 13, 2012 at 11AM.

## 2012-11-15 NOTE — Progress Notes (Signed)
Patient ID: Melissa Robinson, female   DOB: 01-Nov-1916, 79 y.o.   MRN: [id number] PCP: Dr. Clelia Croft  79 yo with chronic systolic CHF, persistent atrial fibrillation, and CKD presents for cardiology followup.  She was admitted in 7/14 with dyspnea and volume overload.  She was noted to be in atrial fibrillation again (had a first episode that was transient in 4/14).  This time, the atrial fibrillation was persistent.  She was started on reduced-dose apixaban and diuresed.  She was cardioverted to NSR in 9/14.  She was admitted again in 10/14 with dizziness and orthostasis.  Hydralazine/Imdur and amiodarone were stopped and Lasix was held.  She developed a mild CHF exacerbation so Lasix was restarted at 40 mg po every other day.  She was discharged to [facility] for rehab.   Patient is stable currently.  She is still dizzy with standing and unstable on her feet but is doing better.  She is working with PT.  She is mostly in a wheelchair but does some walking with a walker.  She got a dose of clonidine yesterday for SBP in the 150s that made her very lightheaded.  No falls.  She gets short of breath working with PT, but no dyspnea at rest.    ECG: NSR, frequent PACs, IVCD  Labs (7/14): K 3.9, creatinine 2.27, HCT 33.4 Labs (8/14): K 3.5, creatinine 2.1 Labs (9/14): K 3.7, creatinine 2.09, BNP 10669 Labs (10/14): K 4.2, creatinine 2, BNP 9780  PMH: 1. Gout 2. Chronic LBBB 3. H/o OSA 4. Chronic systolic CHF: Cardiomyopathy of uncertain etiology, possibly nonischemic.  Echo (7/14) with EF 35-40%, diffuse hypokinesis, normal RV with mildly decreased RV systolic function, PA systolic pressure 46 mmHg.  5. GERD with recurrent esophageal strictures, last dilatation in 7/14.  6. Atrial fibrillation: Paroxysmal.  DCCV to NSR in 9/14, on Eliquis and amiodarone.  7. Colon cancer: s/p lap-colectomy in 5/08.   8. Anemia of chronic disease 9. CKD 10. H/o hip fracture  SH: Lives in [facility] independent [facility].   Widow, 10 children, nonsmoker.   FH: No premature CAD  ROS: All systems reviewed and negative except as per HPI.   Current Outpatient Prescriptions  Medication Sig Dispense Refill  . apixaban (ELIQUIS) 2.5 MG TABS tablet Take 1 tablet (2.5 mg total) by mouth 2 (two) times daily.  180 tablet  3  . CALCIUM-VITAMIN D PO Take 1 tablet by mouth 2 (two) times daily.      . cholecalciferol (VITAMIN D) 400 UNITS TABS Take 400 Units by mouth daily.      . cyanocobalamin 500 MCG tablet Take 500 mcg by mouth daily.      Marland Kitchen denosumab (PROLIA) 60 MG/ML SOLN injection Inject 60 mg into the skin every 6 (six) months. Administer in upper arm, thigh, or abdomen      . furosemide (LASIX) 40 MG tablet Take 1 tablet (40 mg total) by mouth every other day.  30 tablet  6  . iron polysaccharides (NIFEREX) 150 MG capsule Take 150 mg by mouth daily.      . metoprolol succinate (TOPROL-XL) 25 MG 24 hr tablet Take 1 tablet (25 mg total) by mouth 2 (two) times daily.      . mirabegron ER (MYRBETRIQ) 25 MG TB24 Take 25 mg by mouth daily.      . Multiple Vitamin (MULTIVITAMIN WITH MINERALS) TABS Take 1 tablet by mouth daily.      Marland Kitchen omeprazole (PRILOSEC) 40 MG capsule Tablet twice  daily- 30 minutes before breakfast and supper  60 capsule  11  . polyethylene glycol (MIRALAX / GLYCOLAX) packet Take 17 g by mouth daily as needed.  14 each  0  . ciprofloxacin (CIPRO) 250 MG tablet Take 1 tab by mouth, BID, for 3 days  6 tablet  0  . feeding supplement, ENSURE COMPLETE, (ENSURE COMPLETE) LIQD Take 237 mLs by mouth daily.       No current facility-administered medications for this visit.    BP 118/50  Pulse 81  Ht 5\' 5"  (1.651 m)  Wt 52.617 kg (116 lb)  BMI 19.3 kg/m2  SpO2 97% General: NAD Neck: JVP 7-8 cm, no thyromegaly or thyroid nodule.  Lungs: Slight crackles at bases bilaterally CV: Nondisplaced PMI.  Heart mildly irregular S1/S2, no S3/S4, no murmur.  No peripheral edema.  No carotid bruit.  Normal pedal  pulses.  Abdomen: Soft, nontender, no hepatosplenomegaly, no distention.  Skin: Intact without lesions or rashes.  Neurologic: Alert and oriented x 3.  Psych: Normal affect. Extremities: No clubbing or cyanosis.   Assessment/Plan: 1. Atrial fibrillation: Remains in NSR though she has very frequent PACs.  I stopped amiodarone due to dizziness and nausea.  She remains on reduced-dose Eliquis.  - Continue Eliquis at reduced dose.  - Toprol XL now 25 mg bid rather than 50 mg daily to spread it out more.  2. Chronic systolic CHF: EF 82-95% on last echo.  No known history of CAD.  Possible LBBB cardiomyopathy.  Stable NYHA class III symptoms. - Continue Toprol XL at current dose.  - No ACEI with CKD, off hydralazine/Imdur with orthostasis/dizziness  - Continue current Lasix dose for now, every other day rather than daily.  - Low sodium diet.  3. CKD: Creatinine stably elevated.  BMET today.  4. Dizziness/orthostasis: She is off a number of her cardiac meds now. Lasix decreased to every other day.  When Lasix was stopped in the hospital, she developed a CHF exacerbation so will continue the current dose.  She is working with PT which hopefully over time will help.  - Of note, she should not get prn clonidine.  I asked the rehab to take it off her orders list.   [name] 11/15/2012

## 2012-12-05 DIAGNOSIS — I150 Renovascular hypertension: Secondary | ICD-10-CM | POA: Insufficient documentation

## 2012-12-05 NOTE — Progress Notes (Signed)
Patient ID: Melissa Robinson, female   DOB: 17-Apr-1916, 79 y.o.   MRN: [id number]        HISTORY & PHYSICAL  DATE: 11/09/2012   FACILITY: [facility]  LEVEL OF CARE: SNF (31)  ALLERGIES:  No Known Allergies  CHIEF COMPLAINT:  Manage CHF, atrial fibrillation, and hypertension.    HISTORY OF PRESENT ILLNESS:  The patient is a 79 year-old, Caucasian female who was hospitalized secondary to dizziness and recurrent falls.  After hospitalization, she is admitted to this facility for short-term rehabilitation.  She has the following problems:    CHF:The patient does not relate significant weight changes, denies sob, DOE, orthopnea, PNDs, pedal edema, palpitations or chest pain.  CHF remains stable.  No complications form the medications being used.  EF 35%.    ATRIAL FIBRILLATION: the patients atrial fibrillation remains stable.  The patient denies DOE, tachycardia, orthopnea, transient neurological sx, pedal edema, palpitations, & PNDs.  No complications noted from the medications currently being used.    HTN: Pt 's HTN remains stable.  Denies CP, sob, DOE, pedal edema, headaches, dizziness or visual disturbances.  No complications from the medications currently being used.  Last BP :  150/74.    PAST MEDICAL HISTORY :  Past Medical History  Diagnosis Date  . Hyperlipemia   . Neuropathy   . Atrial fibrillation     a. Noted during 04/2012 hospitalization.  . Peptic stricture of esophagus     a. Recurrent, s/p previous diltiation. b. Food impaction 04/2012 - had dilitation 05/2012, but dysphagia recurred 07/2012.  Marland Kitchen Hip fracture, right     a. 2012 from a fall.  . Mitral regurgitation     a. Mod by echo 2008.  Marland Kitchen Aortic insufficiency     a. Mild by echo 2008.  Marland Kitchen Urinary incontinence   . Gout   . Arthritis   . Sleep apnea   . Gait instability   . LBBB (left bundle branch block)   . Anemia     Previously reported as IDA in setting of colon CA, then anemia of chronic  disease.  . Colon cancer     a. Stage III colon CA s/p R lap-assisted colectomy 05/2006.  Marland Kitchen CHF (congestive heart failure)     a. Echo 2008 in setting of acute illness: EF 35-40%, mod LVH, mild AI, moderate MR, LA mildly dilated, PA pressure .  Marland Kitchen GERD (gastroesophageal reflux disease)   . Hypertension   . CKD (chronic kidney disease)     a. Stage III.  Marland Kitchen Renal cyst, left   . Osteoporosis     a. T10/11/12 compression fracture s/p kyphoplasty (12/10). b. R hip fracture s/p ORIF (1/12)-->Prolia 12/12.  . Esophageal dysmotility 05/21/2012    PAST SURGICAL HISTORY: Past Surgical History  Procedure Laterality Date  . Esophagogastroduodenoscopy    . Colonosopy    . Hip fracture surgery    . Esophagogastroduodenoscopy N/A 05/21/2012    Procedure: ESOPHAGOGASTRODUODENOSCOPY (EGD);  Surgeon: Hilarie Fredrickson, MD;  Location: [facility] ENDOSCOPY;  Service: Endoscopy;  Laterality: N/A;  . Esophagogastroduodenoscopy (egd) with esophageal dilation N/A 08/22/2012    Procedure: ESOPHAGOGASTRODUODENOSCOPY (EGD) WITH ESOPHAGEAL DILATION;  Surgeon: Hilarie Fredrickson, MD;  Location: [facility] ENDOSCOPY;  Service: Endoscopy;  Laterality: N/A;  . Esophagogastroduodenoscopy N/A 09/27/2012    Procedure: ESOPHAGOGASTRODUODENOSCOPY (EGD);  Surgeon: Iva Boop, MD;  Location: [facility] ENDOSCOPY;  Service: Endoscopy;  Laterality: N/A;  . Cardioversion N/A 10/06/2012    Procedure:  CARDIOVERSION;  Surgeon: Laurey Morale, MD;  Location: [facility] ENDOSCOPY;  Service: Cardiovascular;  Laterality: N/A;    SOCIAL HISTORY:  reports that she has never smoked. She does not have any smokeless tobacco history on file. She reports that she does not drink alcohol or use illicit drugs.  FAMILY HISTORY:  Family History  Problem Relation Age of Onset  . Heart disease Mother   . Breast cancer Sister   . Cancer      Head/neck cancer  . Depression Father   . Suicidality Father     CURRENT MEDICATIONS: Reviewed per [facility]  REVIEW OF SYSTEMS:  See HPI  otherwise 14 point ROS is negative.  PHYSICAL EXAMINATION  VS:  T 97.4       P 70      RR 20      BP 150/74      POX 96%        WT (Lb)  GENERAL: no acute distress, normal body habitus EYES: conjunctivae normal, sclerae normal, normal eye lids MOUTH/THROAT: lips without lesions,no lesions in the mouth,tongue is without lesions,uvula elevates in midline NECK: supple, trachea midline, no neck masses, no thyroid tenderness, no thyromegaly LYMPHATICS: no LAN in the neck, no supraclavicular LAN RESPIRATORY: breathing is even & unlabored, BS CTAB CARDIAC: heart rate is irregular irregular, no murmur,no extra heart sounds, no edema GI:  ABDOMEN: abdomen soft, normal BS, no masses, no tenderness  LIVER/SPLEEN: no hepatomegaly, no splenomegaly MUSCULOSKELETAL: HEAD: normal to inspection & palpation BACK: no kyphosis, scoliosis or spinal processes tenderness EXTREMITIES: LEFT UPPER EXTREMITY: strength intact, range of motion moderate   RIGHT UPPER EXTREMITY: strength intact, range of motion moderate   LEFT LOWER EXTREMITY:  full range of motion, normal strength & tone RIGHT LOWER EXTREMITY:  full range of motion, normal strength & tone PSYCHIATRIC: the patient is alert & oriented to person, affect & behavior appropriate  LABS/RADIOLOGY: Chest x-ray:  Showed COPD.    CT of the head:  No acute findings, small posterior left MCA infarct.    BUN 38, creatinine 2.01, otherwise BMP normal.    Hemoglobin 11.7, MCV 93.9, platelets 145, WBC 6.     INR 1.11.    ASSESSMENT/PLAN:  CHF.  Well compensated.    Atrial fibrillation.  Rate controlled.      Chronic kidney disease stage IV.   Reassess renal functions.    Renovascular hypertension.  Blood pressure elevated.  We will monitor.    UTI.  Continue Cipro as prescribed.    GERD.  Well controlled.     Malnutrition.  Continue dietary supplementation.    Check CBC and BMP.    I have reviewed patient's medical records received at  admission/from hospitalization.  CPT CODE: 09811

## 2012-12-07 ENCOUNTER — Non-Acute Institutional Stay (SKILLED_NURSING_FACILITY): Payer: Medicare Other | Admitting: Internal Medicine

## 2012-12-07 DIAGNOSIS — I5022 Chronic systolic (congestive) heart failure: Secondary | ICD-10-CM

## 2012-12-07 DIAGNOSIS — D631 Anemia in chronic kidney disease: Secondary | ICD-10-CM

## 2012-12-07 DIAGNOSIS — N184 Chronic kidney disease, stage 4 (severe): Secondary | ICD-10-CM

## 2012-12-07 DIAGNOSIS — I480 Paroxysmal atrial fibrillation: Secondary | ICD-10-CM

## 2012-12-07 DIAGNOSIS — I509 Heart failure, unspecified: Secondary | ICD-10-CM

## 2012-12-07 DIAGNOSIS — I4891 Unspecified atrial fibrillation: Secondary | ICD-10-CM

## 2012-12-09 DIAGNOSIS — D631 Anemia in chronic kidney disease: Secondary | ICD-10-CM | POA: Insufficient documentation

## 2012-12-09 DIAGNOSIS — N184 Chronic kidney disease, stage 4 (severe): Secondary | ICD-10-CM | POA: Insufficient documentation

## 2012-12-09 NOTE — Progress Notes (Signed)
          PROGRESS NOTE  DATE: 12/07/2012  FACILITY: Nursing Home Location: [facility]  LEVEL OF CARE: SNF (31)  Routine Visit  CHIEF COMPLAINT:  Manage atrial fibrillation, CHF & anemia of chronic kidney dz  HISTORY OF PRESENT ILLNESS:  REASSESSMENT OF ONGOING PROBLEM(S):  ATRIAL FIBRILLATION: the patients atrial fibrillation remains stable.  The patient denies DOE, tachycardia, orthopnea, transient neurological sx, pedal edema, palpitations, & PNDs.  No complications noted from the medications currently being used.  CHF:The patient does not relate significant weight changes, denies sob, DOE, orthopnea, PNDs, pedal edema, palpitations or chest pain.  CHF remains stable.  No complications form the medications being used.  ANEMIA: The anemia has been stable. The patient denies fatigue, melena or hematochezia. No complications from the medications currently being used.  No recent labs.  PAST MEDICAL HISTORY : Reviewed.  No changes.  CURRENT MEDICATIONS: Reviewed per [facility]  REVIEW OF SYSTEMS:  GENERAL: no change in appetite, no fatigue, no weight changes, no fever, chills or weakness RESPIRATORY: no cough, SOB, DOE, wheezing, hemoptysis CARDIAC: no chest pain, edema or palpitations GI: no abdominal pain, diarrhea, constipation, heart burn, nausea or vomiting  PHYSICAL EXAMINATION  VS:  Not recorded  GENERAL: no acute distress, normal body habitus EYES: conjunctivae normal, sclerae normal, normal eye lids NECK: supple, trachea midline, no neck masses, no thyroid tenderness, no thyromegaly LYMPHATICS: no LAN in the neck, no supraclavicular LAN RESPIRATORY: breathing is even & unlabored, BS CTAB CARDIAC: irregularly irregular, no murmur,no extra heart sounds, no edema GI: abdomen soft, normal BS, no masses, no tenderness, no hepatomegaly, no splenomegaly PSYCHIATRIC: the patient is alert & oriented to person, affect & behavior appropriate  LABS/RADIOLOGY:  none  ASSESSMENT/PLAN:  CHF-well compensated. Atrial fibrillation-rate controlled. Anemia of chronic kidney dz-check Hb. CKD IV-check renal fcns. GERD- stable. Renovascular HTN- request BP charting. Check cbc & cmp.  CPT CODE: 16109

## 2012-12-13 ENCOUNTER — Encounter: Payer: Self-pay | Admitting: Cardiology

## 2012-12-13 ENCOUNTER — Ambulatory Visit (INDEPENDENT_AMBULATORY_CARE_PROVIDER_SITE_OTHER): Payer: Medicare Other | Admitting: Cardiology

## 2012-12-13 VITALS — BP 110/52 | HR 53 | Ht 64.0 in | Wt 114.0 lb

## 2012-12-13 DIAGNOSIS — I480 Paroxysmal atrial fibrillation: Secondary | ICD-10-CM

## 2012-12-13 DIAGNOSIS — I509 Heart failure, unspecified: Secondary | ICD-10-CM

## 2012-12-13 DIAGNOSIS — R42 Dizziness and giddiness: Secondary | ICD-10-CM

## 2012-12-13 DIAGNOSIS — I5022 Chronic systolic (congestive) heart failure: Secondary | ICD-10-CM

## 2012-12-13 DIAGNOSIS — I4891 Unspecified atrial fibrillation: Secondary | ICD-10-CM

## 2012-12-13 DIAGNOSIS — N184 Chronic kidney disease, stage 4 (severe): Secondary | ICD-10-CM

## 2012-12-13 NOTE — Patient Instructions (Signed)
Your physician recommends that you schedule a follow-up appointment in: 4 months with Dr McLean.  

## 2012-12-13 NOTE — Progress Notes (Signed)
Patient ID: Melissa Robinson, female   DOB: 05/11/1916, 79 y.o.   MRN: [id number] PCP: Dr. Clelia Croft  79 yo with chronic systolic CHF, persistent atrial fibrillation, and CKD presents for cardiology followup.  She was admitted in 7/14 with dyspnea and volume overload.  She was noted to be in atrial fibrillation again (had a first episode that was transient in 4/14).  This time, the atrial fibrillation was persistent.  She was started on reduced-dose apixaban and diuresed.  She was cardioverted to NSR in 9/14.  She was admitted again in 10/14 with dizziness and orthostasis.  Hydralazine/Imdur and amiodarone were stopped and Lasix was held.  She developed a mild CHF exacerbation so Lasix was restarted at 40 mg po every other day.  She was discharged to [facility] for rehab.   Patient is stable currently.  However, she is still "dizzy" with standing and unstable on her feet.  The dizziness sounds like lightheadedness rather than vertigo, but she was not orthostatic today when I had her stand and measured her blood pressure.  She "feels bad" in general.  She is able to walk with her walker though she does not go far.  She is short of breath after walking about 20 feet.  No falls.  She is working with PT.    Labs (7/14): K 3.9, creatinine 2.27, HCT 33.4 Labs (8/14): K 3.5, creatinine 2.1 Labs (9/14): K 3.7, creatinine 2.09, BNP 10669 Labs (10/14): K 4.2, creatinine 2 =>1.8, BNP 9780 => 1157  PMH: 1. Gout 2. Chronic LBBB 3. H/o OSA 4. Chronic systolic CHF: Cardiomyopathy of uncertain etiology, possibly nonischemic.  Echo (7/14) with EF 35-40%, diffuse hypokinesis, normal RV with mildly decreased RV systolic function, PA systolic pressure 46 mmHg.  5. GERD with recurrent esophageal strictures, last dilatation in 7/14.  6. Atrial fibrillation: Paroxysmal.  DCCV to NSR in 9/14, on Eliquis and amiodarone.  7. Colon cancer: s/p lap-colectomy in 5/08.   8. Anemia of chronic disease 9. CKD 10. H/o hip  fracture  SH: Lives in [facility] independent [facility].  Widow, 10 children, nonsmoker.   FH: No premature CAD  Current Outpatient Prescriptions  Medication Sig Dispense Refill  . apixaban (ELIQUIS) 2.5 MG TABS tablet Take 1 tablet (2.5 mg total) by mouth 2 (two) times daily.  180 tablet  3  . cholecalciferol (VITAMIN D) 400 UNITS TABS Take 400 Units by mouth daily.      . ciprofloxacin (CIPRO) 250 MG tablet Take 1 tab by mouth, BID, for 3 days  6 tablet  0  . cyanocobalamin 500 MCG tablet Take 500 mcg by mouth daily.      Marland Kitchen denosumab (PROLIA) 60 MG/ML SOLN injection Inject 60 mg into the skin every 6 (six) months. Administer in upper arm, thigh, or abdomen      . feeding supplement, ENSURE COMPLETE, (ENSURE COMPLETE) LIQD Take 237 mLs by mouth daily.      . furosemide (LASIX) 40 MG tablet Take 1 tablet (40 mg total) by mouth every other day.  30 tablet  6  . iron polysaccharides (NIFEREX) 150 MG capsule Take 150 mg by mouth daily.      . metoprolol succinate (TOPROL-XL) 25 MG 24 hr tablet Take 1 tablet (25 mg total) by mouth 2 (two) times daily.      . mirabegron ER (MYRBETRIQ) 25 MG TB24 Take 25 mg by mouth daily.      Marland Kitchen omeprazole (PRILOSEC) 40 MG capsule Tablet twice daily- 30 minutes  before breakfast and supper  60 capsule  11   No current facility-administered medications for this visit.    BP 110/52  Pulse 53  Ht 5\' 4"  (1.626 m)  Wt 114 lb (51.71 kg)  BMI 19.56 kg/m2  SpO2 97% General: NAD Neck: JVP 7 cm, no thyromegaly or thyroid nodule.  Lungs: Slight crackles at bases bilaterally CV: Nondisplaced PMI.  Heart mildly irregular S1/S2, no S3/S4, no murmur.  No peripheral edema.  No carotid bruit.  Normal pedal pulses.  Abdomen: Soft, nontender, no hepatosplenomegaly, no distention.  Skin: Intact without lesions or rashes.  Neurologic: Alert and oriented x 3.  Psych: Normal affect. Extremities: No clubbing or cyanosis.   Assessment/Plan: 1. Atrial fibrillation: Remains in  NSR though she has very frequent PACs.  I stopped amiodarone due to dizziness and nausea.  She remains on reduced-dose Eliquis.  - Continue Eliquis at reduced dose.  - She is not orthostatic today, but is generally weak.  She is bradycardic.  I will have her decrease Toprol XL to 12.5 mg bid.  2. Chronic systolic CHF: EF 16-10% on last echo.  No known history of CAD.  Possible LBBB cardiomyopathy.  Stable NYHA class III symptoms. - Continue Toprol XL.  - No ACEI with CKD, off hydralazine/Imdur with orthostasis/dizziness  - Continue current Lasix dose for now, every other day rather than daily.  - Low sodium diet.  3. CKD: Creatinine stably elevated.   4. Dizziness/orthostasis: She is off a number of her cardiac meds now. Lasix decreased to every other day.  When Lasix was stopped in the hospital, she developed a CHF exacerbation so will continue the current dose.  She is working with PT which hopefully over time will help.  - As above, will decrease Toprol XL to 12.5 mg bid given generalized weakness.    [name] 12/13/2012

## 2012-12-15 ENCOUNTER — Telehealth: Payer: Self-pay | Admitting: *Deleted

## 2012-12-15 MED ORDER — METOPROLOL SUCCINATE ER 25 MG PO TB24: 12.5000 mg | ORAL_TABLET | Freq: Two times a day (BID) | ORAL | Status: DC

## 2012-12-15 NOTE — Telephone Encounter (Signed)
Laurey Morale, MD Jacqlyn Krauss, RN             I changed my mind. I want to see how Mrs Thedford does on lower Toprol XL dosing. Please call her daughter and ask them to decrease the Toprol XL to 12.5 mg bid.      12/15/12 pt's daughter,Carol notified of Dr Alford Highland order to decrease Toprol XL to 12.5mg  bid. She will come by this afternoon to pick up order and prescription to take to rehab facility where patient is currently.

## 2012-12-15 NOTE — Telephone Encounter (Signed)
Order to decrease metoprolol to 12.5mg  bid /prescription for metoprolol 12.5mg  bid  left at front desk for pt's daughter,Carol to pick up.

## 2012-12-20 ENCOUNTER — Encounter: Payer: Self-pay | Admitting: *Deleted

## 2012-12-20 ENCOUNTER — Telehealth: Payer: Self-pay | Admitting: Cardiology

## 2012-12-20 NOTE — Telephone Encounter (Signed)
New problem   Need to know why Dr Shirlee Latch order for pt to take Eliquis and want to know if it can be substituted with Plavix. Please advise

## 2012-12-20 NOTE — Telephone Encounter (Signed)
Spoke with patient's daughter, Darl Pikes. Pt gets  prescriptions from [place] so OK to speak with them about pt's prescriptions.

## 2013-01-13 ENCOUNTER — Non-Acute Institutional Stay (SKILLED_NURSING_FACILITY): Payer: Medicare Other | Admitting: Adult Health

## 2013-01-13 DIAGNOSIS — N318 Other neuromuscular dysfunction of bladder: Secondary | ICD-10-CM

## 2013-01-13 DIAGNOSIS — I10 Essential (primary) hypertension: Secondary | ICD-10-CM

## 2013-01-13 DIAGNOSIS — N3281 Overactive bladder: Secondary | ICD-10-CM

## 2013-01-13 DIAGNOSIS — I5022 Chronic systolic (congestive) heart failure: Secondary | ICD-10-CM

## 2013-01-13 DIAGNOSIS — I480 Paroxysmal atrial fibrillation: Secondary | ICD-10-CM

## 2013-01-13 DIAGNOSIS — D509 Iron deficiency anemia, unspecified: Secondary | ICD-10-CM

## 2013-01-13 DIAGNOSIS — I4891 Unspecified atrial fibrillation: Secondary | ICD-10-CM

## 2013-01-13 DIAGNOSIS — K219 Gastro-esophageal reflux disease without esophagitis: Secondary | ICD-10-CM

## 2013-01-13 DIAGNOSIS — M810 Age-related osteoporosis without current pathological fracture: Secondary | ICD-10-CM

## 2013-01-13 DIAGNOSIS — I509 Heart failure, unspecified: Secondary | ICD-10-CM

## 2013-01-16 ENCOUNTER — Telehealth: Payer: Self-pay | Admitting: Cardiology

## 2013-01-16 NOTE — Telephone Encounter (Signed)
[facility] is [facility]. The pt take Eliquis for Atrial Fibrillation.  Plavix is not indicated for this diagnosis. I left a message for the pt's daughter to contact the office.

## 2013-01-16 NOTE — Telephone Encounter (Signed)
New problem    Dr Andrey Campanile @ [facility] [phone] would like to know whether they can give Plavics instead of Eliqust??? Please give [facility] a call.  Pt's daughter called, call her back with any questions.

## 2013-01-17 NOTE — Progress Notes (Signed)
Patient ID: Melissa Robinson, female   DOB: 1916-08-08, 79 y.o.   MRN: [id number]                   PROGRESS NOTE  DATE: 01/13/13  FACILITY: Nursing Home Location: [facility]  LEVEL OF CARE: SNF (31)  Acute Visit  CHIEF COMPLAINT:  Discharge Notes  HISTORY OF PRESENT ILLNESS: This is a 79 year old female who is for discharge to [place] with Home health PT, OT and Nursing. DME: lightweight wheelchair with cushion. She has been admitted to [facility] on 11/04/12 from [facility] with principal problem of dizziness. Patient was admitted to this facility for short-term rehabilitation after the patient's recent hospitalization. Patient has completed SNF rehabilitation and therapy has cleared the patient for discharge.    REASSESSMENT OF ONGOING PROBLEM(S):  HTN: Pt 's HTN remains stable.  Denies CP, sob, DOE, pedal edema, headaches, dizziness or visual disturbances.  No complications from the medications currently being used.  Last BP : 103/55  GERD: pt's GERD is stable.  Denies ongoing heartburn, abd. Pain, nausea or vomiting.  Currently on a PPI & tolerates it without any adverse reactions.  OSTEOPOROSIS: The patient's osteoporosis remains stable. The patient denies recent worsening of pain and the range of motion remains stable. There has not been any evidence of fractures. No complications noted from the medications presently being used.   PAST MEDICAL HISTORY : Reviewed.  No changes.  CURRENT MEDICATIONS: Reviewed per [facility]  REVIEW OF SYSTEMS:  GENERAL: no change in appetite, no fatigue, no weight changes, no fever, chills or weakness RESPIRATORY: no cough, SOB, DOE, wheezing, hemoptysis CARDIAC: no chest pain, edema or palpitations GI: no abdominal pain, diarrhea, constipation, heart burn, nausea or vomiting  PHYSICAL EXAMINATION  VS: T96   PR67   RR16    BP103/55  O2 sat 97%   GENERAL: no acute distress, normal body habitus EYES: conjunctivae  normal, sclerae normal, normal eye lids NECK: supple, trachea midline, no neck masses, no thyroid tenderness, no thyromegaly RESPIRATORY: breathing is even & unlabored, BS CTAB CARDIAC: irregularly irregular, no murmur,no extra heart sounds, no edema GI: abdomen soft, normal BS, no masses, no tenderness, no hepatomegaly, no splenomegaly PSYCHIATRIC: the patient is alert & oriented to person, affect & behavior appropriate  LABS/RADIOLOGY:  12/20/12 sodium 141 potassium 3.8 glucose 132 BUN 32 creatinine 1.8 calcium 9.5 12/08/12 sodium 140 potassium 4.3 glucose 89 BUN 34 creatinine 1.8 calcium 9.2 WBC 6.5 hemoglobin 11.2 hematocrit 37.1 11/18/12 sodium 140 potassium 3.8 glucose 167 BUN 28 creatinine 1.4 calcium 8.9 11/10/12 sodium 141 potassium 4.1 glucose 107 BUN 35 creatinine 1.8 calcium 9.4  ASSESSMENT/PLAN:  CHF-well compensated; continue Lasix. Atrial fibrillation-rate controlled; continue Eliquis Anemia - stable CKD IV-stable GERD- stable; continue Prilosec Renovascular HTN- well controlled; continue Toprol Osteoporosis - continue Prolia Q 6 months  I have filled out patient's discharge paperwork and written prescriptions. Patient will receive home health PT, OT and Nursing.   DME provided: lightweight wheelchair with cushion Total discharge time: Greater than 30 minutes  Discharge time involved coordination of the discharge process with Child psychotherapist, nursing staff and therapy department. Medical justification for home health services/DME verified.      CPT CODE: 40981

## 2013-01-17 NOTE — Telephone Encounter (Signed)
New Message  Melissa Robinson from [place] states that she has faxed over a request for medical records twice.. Dr. Ronnette Juniper is asking if the pt can take Plavix instead of Eloquis and if not please fax an office note to the Fax number listed below.   Fax #  [phone]

## 2013-01-17 NOTE — Telephone Encounter (Signed)
Note faxed to number listed.

## 2013-01-23 ENCOUNTER — Ambulatory Visit: Payer: Medicare Other | Admitting: Cardiology

## 2013-02-23 ENCOUNTER — Other Ambulatory Visit: Payer: Self-pay | Admitting: *Deleted

## 2013-02-23 ENCOUNTER — Ambulatory Visit (INDEPENDENT_AMBULATORY_CARE_PROVIDER_SITE_OTHER): Payer: Medicare Other | Admitting: Cardiology

## 2013-02-23 ENCOUNTER — Encounter: Payer: Self-pay | Admitting: Cardiology

## 2013-02-23 VITALS — BP 110/80 | HR 68 | Ht 65.0 in | Wt 116.4 lb

## 2013-02-23 DIAGNOSIS — R0602 Shortness of breath: Secondary | ICD-10-CM

## 2013-02-23 DIAGNOSIS — I4891 Unspecified atrial fibrillation: Secondary | ICD-10-CM

## 2013-02-23 DIAGNOSIS — I509 Heart failure, unspecified: Secondary | ICD-10-CM

## 2013-02-23 DIAGNOSIS — N184 Chronic kidney disease, stage 4 (severe): Secondary | ICD-10-CM

## 2013-02-23 DIAGNOSIS — I5022 Chronic systolic (congestive) heart failure: Secondary | ICD-10-CM

## 2013-02-23 DIAGNOSIS — I480 Paroxysmal atrial fibrillation: Secondary | ICD-10-CM

## 2013-02-23 LAB — BASIC METABOLIC PANEL
BUN: 55 mg/dL — ABNORMAL HIGH (ref 6–23)
CO2: 29 mEq/L (ref 19–32)
CREATININE: 2.2 mg/dL — AB (ref 0.4–1.2)
Calcium: 8.9 mg/dL (ref 8.4–10.5)
Chloride: 101 mEq/L (ref 96–112)
GFR: 22.32 mL/min — ABNORMAL LOW (ref >60.00)
Glucose, Bld: 124 mg/dL — ABNORMAL HIGH (ref 70–99)
Potassium: 3.3 mEq/L — ABNORMAL LOW (ref 3.5–5.1)
Sodium: 139 mEq/L (ref 135–145)

## 2013-02-23 LAB — BRAIN NATRIURETIC PEPTIDE: PRO B NATRI PEPTIDE: 1482 pg/mL — AB (ref 0.0–100.0)

## 2013-02-23 MED ORDER — FUROSEMIDE 40 MG PO TABS: ORAL_TABLET | ORAL | Status: DC

## 2013-02-23 MED ORDER — POTASSIUM CHLORIDE ER 10 MEQ PO TBCR: 10.0000 meq | EXTENDED_RELEASE_TABLET | Freq: Every day | ORAL | Status: AC

## 2013-02-23 NOTE — Patient Instructions (Signed)
Increase lasix(furosemide) to 60mg  two times a day if she is currently taking 40mg  two times a day. This will be 1 and 1/2 40mg  tablets two times a day. Call Desiree Lucy, RN if she is not currently taking lasix 40mg  two times a day. [phone].  Your physician recommends that you have  lab work today--BMET/BNP.  Your physician recommends that you schedule a follow-up appointment in: 1 week.

## 2013-02-23 NOTE — Progress Notes (Signed)
Patient ID: Melissa Robinson, female   DOB: 17-Aug-1916, 80 y.o.   MRN: [id number] PCP: Dr. Brigitte Pulse  80 yo with chronic systolic CHF, atrial fibrillation, and CKD presents for cardiology followup.  She was admitted in 7/14 with dyspnea and volume overload.  She was noted to be in atrial fibrillation again (had a first episode that was transient in 4/14).  This time, the atrial fibrillation was persistent.  She was started on reduced-dose apixaban and diuresed.  She was cardioverted to NSR in 9/14.  She was admitted again in 10/14 with dizziness and orthostasis.  Hydralazine/Imdur and amiodarone were stopped and Lasix was held.  She developed a mild CHF exacerbation so Lasix was restarted at 40 mg po every other day.  She was discharged to [facility] for rehab. She is now living with her son.  Last week, patient started to feel worse.  She had chest heaviness on and off (not with exertion, no particular trigger).  She developed increased dyspnea to the point where she is short of breath with minimal exertion/short distances with her walker.  Dr Brigitte Pulse increased Lasix to 40 mg bid last week.  She is not sure that this helped much.  Her HR today is controlled in the 60s-70s but she is back in atrial fibrillation by ECG.  She is going to be followed by hospice.     Labs (7/14): K 3.9, creatinine 2.27, HCT 33.4 Labs (8/14): K 3.5, creatinine 2.1 Labs (9/14): K 3.7, creatinine 2.09, BNP 10669 Labs (10/14): K 4.2, creatinine 2 =>1.8, BNP 9780 => 1157 Labs (1/15): K 3.3, creatinine 2.2, BNP 1482  ECG: atrial fibrillation, LBBB  PMH: 1. Gout 2. Chronic LBBB 3. H/o OSA 4. Chronic systolic CHF: Cardiomyopathy of uncertain etiology, possibly nonischemic.  Echo (7/14) with EF 35-40%, diffuse hypokinesis, normal RV with mildly decreased RV systolic function, PA systolic pressure 46 mmHg.  5. GERD with recurrent esophageal strictures, last dilatation in 7/14.  6. Atrial fibrillation: Paroxysmal.  DCCV to NSR in 9/14,  on Eliquis and amiodarone.  7. Colon cancer: s/p lap-colectomy in 5/08.   8. Anemia of chronic disease 9. CKD 10. H/o hip fracture  SH: Lives with son.  Widow, 10 children, nonsmoker.   FH: No premature CAD  ROS: All systems reviewed and negative except as per HPI.   Current Outpatient Prescriptions  Medication Sig Dispense Refill  . apixaban (ELIQUIS) 2.5 MG TABS tablet Take 1 tablet (2.5 mg total) by mouth 2 (two) times daily.  180 tablet  3  . cholecalciferol (VITAMIN D) 400 UNITS TABS Take 400 Units by mouth daily.      . cyanocobalamin 500 MCG tablet Take 500 mcg by mouth daily.      Marland Kitchen denosumab (PROLIA) 60 MG/ML SOLN injection Inject 60 mg into the skin every 6 (six) months. Administer in upper arm, thigh, or abdomen      . feeding supplement, ENSURE COMPLETE, (ENSURE COMPLETE) LIQD Take 237 mLs by mouth daily.      . iron polysaccharides (NIFEREX) 150 MG capsule Take 150 mg by mouth daily.      . metoprolol succinate (TOPROL-XL) 25 MG 24 hr tablet Take 0.5 tablets (12.5 mg total) by mouth 2 (two) times daily.  60 tablet  4  . mirabegron ER (MYRBETRIQ) 25 MG TB24 Take 25 mg by mouth daily.      Marland Kitchen omeprazole (PRILOSEC) 40 MG capsule Tablet twice daily- 30 minutes before breakfast and supper  60 capsule  11  . furosemide (LASIX) 40 MG tablet 1 and 1/2 AM (60mg ) and 1 tablet (40mg ) PM      . potassium chloride (K-DUR) 10 MEQ tablet Take 1 tablet (10 mEq total) by mouth daily.  30 tablet  4   No current facility-administered medications for this visit.    BP 110/80  Pulse 68  Ht 5\' 5"  (1.651 m)  Wt 52.799 kg (116 lb 6.4 oz)  BMI 19.37 kg/m2 General: NAD Neck: JVP 9-10 cm, no thyromegaly or thyroid nodule.  Lungs: Slight crackles at bases bilaterally CV: Nondisplaced PMI.  Heart mildly irregular S1/S2, no S3/S4, no murmur.  No peripheral edema.  No carotid bruit.  Normal pedal pulses.  Abdomen: Soft, nontender, no hepatosplenomegaly, no distention.  Skin: Intact without  lesions or rashes.  Neurologic: Alert and oriented x 3.  Psych: Normal affect. Extremities: No clubbing or cyanosis.   Assessment/Plan: 1. Atrial fibrillation: She is back in atrial fibrillation today.  I suspect this is the cause of her clinical worsening over the last couple of weeks.  I stopped amiodarone in the past due to dizziness and nausea.  She remains on reduced-dose Eliquis.  - Continue Eliquis at reduced dose.  - Continue current Toprol XL as rate is controlled. - Given reasonable rate control, I would hold off on putting her through another cardioversion.   2. Chronic systolic CHF: EF 78-67% on last echo.  No known history of CAD.  Possible LBBB cardiomyopathy.  NYHA class IIIb symptoms, worsened recently.  I suspect that her recent symptomatic worsening correlates to going back into atrial fibrillation.  She does have some volume overload on exam.  - Continue Toprol XL.  - No ACEI with CKD, off hydralazine/Imdur with orthostasis/dizziness  - Lasix was recently increased to 40 mg bid by her PCP.  I am only going to make an incremental additional change today, to 60 qam/40 qpm as her creatinine is up to 2.2.  I will repeat a BMET next week and will see her back in the office.  3. CKD: Creatinine a bit worsened with increased Lasix.   4. Dizziness/orthostasis: This has been persistent for months, worsened by cardiac meds.  5. I agree that hospice is appropriate for Mrs Molitor.  I think that we should focus on comfort and keeping her at home.    Loralie Champagne 02/23/2013

## 2013-03-02 ENCOUNTER — Ambulatory Visit (INDEPENDENT_AMBULATORY_CARE_PROVIDER_SITE_OTHER): Payer: Medicare Other | Admitting: Cardiology

## 2013-03-02 ENCOUNTER — Encounter: Payer: Self-pay | Admitting: Cardiology

## 2013-03-02 VITALS — BP 124/76 | HR 100 | Ht 65.0 in | Wt 117.0 lb

## 2013-03-02 DIAGNOSIS — I509 Heart failure, unspecified: Secondary | ICD-10-CM

## 2013-03-02 DIAGNOSIS — I5022 Chronic systolic (congestive) heart failure: Secondary | ICD-10-CM

## 2013-03-02 DIAGNOSIS — N184 Chronic kidney disease, stage 4 (severe): Secondary | ICD-10-CM

## 2013-03-02 DIAGNOSIS — I4891 Unspecified atrial fibrillation: Secondary | ICD-10-CM

## 2013-03-02 LAB — BASIC METABOLIC PANEL
BUN: 44 mg/dL — ABNORMAL HIGH (ref 6–23)
CALCIUM: 9.8 mg/dL (ref 8.4–10.5)
CO2: 30 mEq/L (ref 19–32)
CREATININE: 2.1 mg/dL — AB (ref 0.4–1.2)
Chloride: 102 mEq/L (ref 96–112)
GFR: 22.8 mL/min — ABNORMAL LOW (ref >60.00)
Glucose, Bld: 101 mg/dL — ABNORMAL HIGH (ref 70–99)
Potassium: 4.3 mEq/L (ref 3.5–5.1)
SODIUM: 139 meq/L (ref 135–145)

## 2013-03-02 MED ORDER — FUROSEMIDE 40 MG PO TABS: ORAL_TABLET | ORAL | Status: DC

## 2013-03-02 MED ORDER — METOPROLOL SUCCINATE ER 25 MG PO TB24: ORAL_TABLET | ORAL | Status: DC

## 2013-03-02 NOTE — Patient Instructions (Signed)
Increase lasix (furosemide) to 60mg  two times a day. This will be 1 and 1/2 of a 40mg  tablet two times a day.   Increase metoprolol succinate(Toprol XL) to 12.5mg  AM and 25 mg PM. This will be 1/2 of a 25mg  tablet AM and 1 of a 25mg  PM.  Your physician recommends that you have  lab work today--BMET.  Your physician recommends that you schedule a follow-up appointment in: March 2015

## 2013-03-03 NOTE — Progress Notes (Signed)
Patient ID: Melissa Robinson, female   DOB: 1916-03-18, 80 y.o.   MRN: [id number] PCP: Dr. Brigitte Pulse  80 yo with chronic systolic CHF, atrial fibrillation, and CKD presents for cardiology followup.  She was admitted in 7/14 with dyspnea and volume overload.  She was noted to be in atrial fibrillation again (had a first episode that was transient in 4/14).  This time, the atrial fibrillation was persistent.  She was started on reduced-dose apixaban and diuresed.  She was cardioverted to NSR in 9/14.  She was admitted again in 10/14 with dizziness and orthostasis.  Hydralazine/Imdur and amiodarone were stopped and Lasix was held.  She developed a mild CHF exacerbation so Lasix was restarted at 40 mg po every other day.  She was discharged to [facility] for rehab. She is now living with her son.  Last month, patient had a symptomatic decline and was noted to be in atrial fibrillation again.  Lasix was increased, she is now on 60 qam/40 qpm.  Overall, she is feeling a bit better but is still quite limited.  Hospice is now coming out.  She gets around her son's house with a walker with some dyspnea (better than a week ago).  No chest pain.  HR is a bit elevated today.   Labs (7/14): K 3.9, creatinine 2.27, HCT 33.4 Labs (8/14): K 3.5, creatinine 2.1 Labs (9/14): K 3.7, creatinine 2.09, BNP 10669 Labs (10/14): K 4.2, creatinine 2 =>1.8, BNP 9780 => 1157 Labs (1/15): K 3.3, creatinine 2.2, BNP 1482  PMH: 1. Gout 2. Chronic LBBB 3. H/o OSA 4. Chronic systolic CHF: Cardiomyopathy of uncertain etiology, possibly nonischemic.  Echo (7/14) with EF 35-40%, diffuse hypokinesis, normal RV with mildly decreased RV systolic function, PA systolic pressure 46 mmHg.  5. GERD with recurrent esophageal strictures, last dilatation in 7/14.  6. Atrial fibrillation: Paroxysmal.  DCCV to NSR in 9/14, on Eliquis and amiodarone.  7. Colon cancer: s/p lap-colectomy in 5/08.   8. Anemia of chronic disease 9. CKD 10. H/o hip  fracture  SH: Lives with son.  Widow, 10 children, nonsmoker.   FH: No premature CAD  ROS: All systems reviewed and negative except as per HPI.   Current Outpatient Prescriptions  Medication Sig Dispense Refill  . apixaban (ELIQUIS) 2.5 MG TABS tablet Take 1 tablet (2.5 mg total) by mouth 2 (two) times daily.  180 tablet  3  . cholecalciferol (VITAMIN D) 400 UNITS TABS Take 400 Units by mouth daily.      . cyanocobalamin 500 MCG tablet Take 500 mcg by mouth daily.      Marland Kitchen denosumab (PROLIA) 60 MG/ML SOLN injection Inject 60 mg into the skin every 6 (six) months. Administer in upper arm, thigh, or abdomen      . feeding supplement, ENSURE COMPLETE, (ENSURE COMPLETE) LIQD Take 237 mLs by mouth daily.      . iron polysaccharides (NIFEREX) 150 MG capsule Take 150 mg by mouth daily.      . metoprolol succinate (TOPROL-XL) 25 MG 24 hr tablet 1/2 tablet (12.5mg ) AM and 1 tablet (25mg ) PM  45 tablet  3  . mirabegron ER (MYRBETRIQ) 25 MG TB24 Take 25 mg by mouth daily.      Marland Kitchen omeprazole (PRILOSEC) 40 MG capsule Tablet twice daily- 30 minutes before breakfast and supper  60 capsule  11  . potassium chloride (K-DUR) 10 MEQ tablet Take 1 tablet (10 mEq total) by mouth daily.  30 tablet  4  .  furosemide (LASIX) 40 MG tablet 1 and 1/2 tablets (60mg ) two times a day  90 tablet  3   No current facility-administered medications for this visit.    BP 124/76  Pulse 100  Ht 5\' 5"  (1.651 m)  Wt 53.071 kg (117 lb)  BMI 19.47 kg/m2  SpO2 99% General: NAD Neck: JVP 12 cm, no thyromegaly or thyroid nodule.  Lungs: Slight crackles at bases bilaterally CV: Nondisplaced PMI.  Heart mildly tachy, irregular S1/S2, no S3/S4, no murmur.  No peripheral edema.  No carotid bruit.  Normal pedal pulses.  Abdomen: Soft, nontender, no hepatosplenomegaly, no distention.  Skin: Intact without lesions or rashes.  Neurologic: Alert and oriented x 3.  Psych: Normal affect. Extremities: No clubbing or cyanosis.    Assessment/Plan: 1. Atrial fibrillation: She is back in atrial fibrillation.  I suspect this is the cause of her clinical worsening last month.  I stopped amiodarone in the past due to dizziness and nausea.  She remains on reduced-dose Eliquis.  - Continue Eliquis at reduced dose.  - Increase Toprol XL to 25 mg bid for better rate control. - I would hold off on putting her through another cardioversion.   2. Chronic systolic CHF: EF 15-40% on last echo.  No known history of CAD.  Possible LBBB cardiomyopathy.  NYHA class IIIb symptoms, a bit better than at last appointment.  I suspect that her recent symptomatic worsening correlates to going back into atrial fibrillation.  She is still volume overloaded on exam.  - Continue Toprol XL.  - No ACEI with CKD, off hydralazine/Imdur with orthostasis/dizziness  - Increase Lasix to 60 mg bid with BMET in 2 wks.  - Oxygen saturation at rest today was ok.  3. CKD: Creatinine 2.2 when last checked, which is roughly stable.    4. Dizziness/orthostasis: This has been persistent for months, worsened by cardiac meds.  5. I agree that hospice is appropriate for Mrs Blase.  I think that we should focus on comfort and keeping her at home.    Loralie Champagne 03/03/2013

## 2013-04-11 ENCOUNTER — Ambulatory Visit: Payer: Medicare Other | Admitting: Cardiology

## 2013-04-12 ENCOUNTER — Ambulatory Visit: Payer: Medicare Other | Admitting: Cardiology

## 2013-04-19 ENCOUNTER — Telehealth: Payer: Self-pay | Admitting: Cardiology

## 2013-04-19 NOTE — Telephone Encounter (Signed)
Spoke with Onyeje, the hospice nurse for patient, who reported that patient has 1+ edema in bilateral lower extremities and some abdominal swelling as well. Onyeje states that the patient denies SOB; states her O2 saturation on room air is 95% and patient has oxygen in the home that she can use if she develops SOB.  Onyeje states she advised the patient to wear her TED hose and to elevate her legs frequently throughout the day.  She reports that patient is currently taking Lasix 60 mg BID.  Onyeje states that she advised family she would notify our office today after her visit with patient, states she does not think the patient needs immediate intervention; states she believes it is r/t disease progression.  I advised Onyeje that Dr. Aundra Dubin is out of the office until Monday and that I will forward message to him but that patient and/or family can call our office with worsening symptoms in the meantime and we will consult with the DOD for advice.  Onyeje verbalized understanding and agreement and states she will communicate this to the family.

## 2013-04-19 NOTE — Telephone Encounter (Signed)
New Message:  Onyeje from Hospice has questions about pt's Lasix and fluid retention. She would like to speak to the nurse.

## 2013-05-10 ENCOUNTER — Encounter: Payer: Self-pay | Admitting: Cardiology

## 2013-05-10 ENCOUNTER — Ambulatory Visit (INDEPENDENT_AMBULATORY_CARE_PROVIDER_SITE_OTHER): Payer: Medicare Other | Admitting: Cardiology

## 2013-05-10 VITALS — BP 160/60 | HR 38 | Ht 65.0 in | Wt 123.0 lb

## 2013-05-10 DIAGNOSIS — I480 Paroxysmal atrial fibrillation: Secondary | ICD-10-CM

## 2013-05-10 DIAGNOSIS — I4891 Unspecified atrial fibrillation: Secondary | ICD-10-CM

## 2013-05-10 DIAGNOSIS — I509 Heart failure, unspecified: Secondary | ICD-10-CM

## 2013-05-10 DIAGNOSIS — R001 Bradycardia, unspecified: Secondary | ICD-10-CM | POA: Insufficient documentation

## 2013-05-10 DIAGNOSIS — I5022 Chronic systolic (congestive) heart failure: Secondary | ICD-10-CM

## 2013-05-10 DIAGNOSIS — I498 Other specified cardiac arrhythmias: Secondary | ICD-10-CM

## 2013-05-10 DIAGNOSIS — N184 Chronic kidney disease, stage 4 (severe): Secondary | ICD-10-CM

## 2013-05-10 LAB — CBC WITH DIFFERENTIAL/PLATELET
BASOS PCT: 0.5 % (ref 0.0–3.0)
Basophils Absolute: 0 10*3/uL (ref 0.0–0.1)
EOS PCT: 0.8 % (ref 0.0–5.0)
Eosinophils Absolute: 0 10*3/uL (ref 0.0–0.7)
HCT: 34.3 % — ABNORMAL LOW (ref 36.0–46.0)
Hemoglobin: 11.3 g/dL — ABNORMAL LOW (ref 12.0–15.0)
Lymphocytes Relative: 18 % (ref 12.0–46.0)
Lymphs Abs: 0.8 10*3/uL (ref 0.7–4.0)
MCHC: 32.8 g/dL (ref 30.0–36.0)
MCV: 94.2 fl (ref 78.0–100.0)
MONO ABS: 0.5 10*3/uL (ref 0.1–1.0)
MONOS PCT: 11.3 % (ref 3.0–12.0)
NEUTROS PCT: 69.4 % (ref 43.0–77.0)
Neutro Abs: 3.2 10*3/uL (ref 1.4–7.7)
Platelets: 107 10*3/uL — ABNORMAL LOW (ref 150.0–400.0)
RBC: 3.64 Mil/uL — AB (ref 3.87–5.11)
RDW: 17.8 % — ABNORMAL HIGH (ref 11.5–14.6)
WBC: 4.6 10*3/uL (ref 4.5–10.5)

## 2013-05-10 LAB — BASIC METABOLIC PANEL
BUN: 60 mg/dL — ABNORMAL HIGH (ref 6–23)
CHLORIDE: 104 meq/L (ref 96–112)
CO2: 25 mEq/L (ref 19–32)
Calcium: 9.8 mg/dL (ref 8.4–10.5)
Creatinine, Ser: 3.2 mg/dL — ABNORMAL HIGH (ref 0.4–1.2)
GFR: 14.54 mL/min — AB (ref >60.00)
Glucose, Bld: 118 mg/dL — ABNORMAL HIGH (ref 70–99)
POTASSIUM: 3.6 meq/L (ref 3.5–5.1)
Sodium: 140 mEq/L (ref 135–145)

## 2013-05-10 MED ORDER — FUROSEMIDE 40 MG PO TABS: ORAL_TABLET | ORAL | Status: DC

## 2013-05-10 NOTE — Progress Notes (Signed)
Patient ID: Melissa Robinson, female   DOB: September 22, 1916, 80 y.o.   MRN: [id number] PCP: Dr. Brigitte Pulse  80 yo with chronic systolic CHF, atrial fibrillation, and CKD presents for cardiology followup.  She was admitted in 7/14 with dyspnea and volume overload.  She was noted to be in atrial fibrillation again (had a first episode that was transient in 4/14).  This time, the atrial fibrillation was persistent.  She was started on reduced-dose apixaban and diuresed.  She was cardioverted to NSR in 9/14.  She was admitted again in 10/14 with dizziness and orthostasis.  Hydralazine/Imdur and amiodarone were stopped and Lasix was held.  She developed a mild CHF exacerbation so Lasix was restarted at 40 mg po every other day.  She was discharged to [facility] for rehab. She is now living with her son. Over the last few months, patient has had a considerable symptomatic decline and has been noted to be in atrial fibrillation again.  Lasix was increased, she is now on 60 mg bid.  She is being followed by hospice.  She is walking very little now.  She can walk around the house with her walker.  She is short of breath walking from one room to another.  She has oxygen for prn use.  She feels profoundly weak.  Legs give out.  HR today is in the upper 30s.  Creatinine has been rising.   ECG: Regularized atrial fibrillation with RBBB/LAFB escape suggestive of potential complete heart block in the setting of atrial fibrillation.  Labs (7/14): K 3.9, creatinine 2.27, HCT 33.4 Labs (8/14): K 3.5, creatinine 2.1 Labs (9/14): K 3.7, creatinine 2.09, BNP 10669 Labs (10/14): K 4.2, creatinine 2 =>1.8, BNP 9780 => 1157 Labs (1/15): K 3.3, creatinine 2.2, BNP 1482 Labs (2/15): K 4.3, creatinine 2.1  PMH: 1. Gout 2. Chronic LBBB 3. H/o OSA 4. Chronic systolic CHF: Cardiomyopathy of uncertain etiology, possibly nonischemic.  Echo (7/14) with EF 35-40%, diffuse hypokinesis, normal RV with mildly decreased RV systolic function, PA  systolic pressure 46 mmHg.  5. GERD with recurrent esophageal strictures, last dilatation in 7/14.  6. Atrial fibrillation: Paroxysmal.  DCCV to NSR in 9/14, on Eliquis and amiodarone.  7. Colon cancer: s/p lap-colectomy in 5/08.   8. Anemia of chronic disease 9. CKD 10. H/o hip fracture  SH: Lives with son.  Widow, 10 children, nonsmoker.   FH: No premature CAD  ROS: All systems reviewed and negative except as per HPI.   Current Outpatient Prescriptions  Medication Sig Dispense Refill  . apixaban (ELIQUIS) 2.5 MG TABS tablet Take 1 tablet (2.5 mg total) by mouth 2 (two) times daily.  180 tablet  3  . cholecalciferol (VITAMIN D) 400 UNITS TABS Take 400 Units by mouth daily.      . cyanocobalamin 500 MCG tablet Take 500 mcg by mouth daily.      Marland Kitchen denosumab (PROLIA) 60 MG/ML SOLN injection Inject 60 mg into the skin every 6 (six) months. Administer in upper arm, thigh, or abdomen      . feeding supplement, ENSURE COMPLETE, (ENSURE COMPLETE) LIQD Take 237 mLs by mouth daily.      . furosemide (LASIX) 40 MG tablet 3 tablets (120mg ) two times a day for 2 days, then 2 tablets (80mg ) two times a day  130 tablet  3  . iron polysaccharides (NIFEREX) 150 MG capsule Take 150 mg by mouth daily.      . mirabegron ER (MYRBETRIQ) 25 MG TB24  Take 25 mg by mouth daily.      Marland Kitchen omeprazole (PRILOSEC) 40 MG capsule Tablet twice daily- 30 minutes before breakfast and supper  60 capsule  11  . potassium chloride (K-DUR) 10 MEQ tablet Take 1 tablet (10 mEq total) by mouth daily.  30 tablet  4   No current facility-administered medications for this visit.    BP 160/60  Pulse 38  Ht 5\' 5"  (1.651 m)  Wt 55.792 kg (123 lb)  BMI 20.47 kg/m2 General: NAD Neck: JVP 10-12 cm, no thyromegaly or thyroid nodule.  Lungs: Slight crackles at bases bilaterally CV: Nondisplaced PMI.  Heart bradycardic, regular S1/S2, no S3/S4, no murmur.  1+ edema to knees bilaterally.  No carotid bruit.  Normal pedal pulses.   Abdomen: Soft, nontender, no hepatosplenomegaly, mild distention.  Skin: Intact without lesions or rashes.  Neurologic: Alert and oriented x 3.  Psych: Normal affect. Extremities: No clubbing or cyanosis.   Assessment/Plan: 1. Atrial fibrillation: Patient is bradycardic today with slow, regularized atrial fibrillation suggestive of possible complete heart block with RBBB escape.  I stopped amiodarone in the past due to dizziness and nausea.  She remains on reduced-dose Eliquis.  - Continue Eliquis at reduced dose for now, check CBC.  - Stop Toprol XL with bradycardia.   - She appears to have tachy-brady syndrome but would avoid pacemaker given hospice/very poor prognosis.   2. Chronic systolic CHF: EF 62-69% on last echo.  No known history of CAD.  Possible LBBB cardiomyopathy.  NYHA class IV.  She is very volume overloaded on exam with JVD and distended abdomen.  - Stopping Toprol XL with bradycardia.  - No ACEI with CKD, off hydralazine/Imdur with orthostasis/dizziness  - Increase Lasix to 120 mg bid x 2 days then 80 mg bid.  I will check BMET today.  Diuresis may be limited by renal function.   3. CKD: Creatinine 2.1 when last checked, repeat today.  4. Continue hospice care.  I think that her prognosis is very limited at this point. I discussed this with patient and daughter-in-law.     Larey Dresser 05/10/2013

## 2013-05-10 NOTE — Patient Instructions (Signed)
Stop metoprolol.   Increase lasix to 120mg  two times a day for 2 days, then decrease lasix to 80mg  two times a day. This will be 3 of your 40mg  tablets two times a day for 2 days, then 2 of your 40mg  tablets two times a day.   Your physician recommends that you have lab work today-BMET/CBCd.  Your physician recommends that you schedule a follow-up appointment in: 3 weeks with Dr Aundra Dubin.

## 2013-06-01 ENCOUNTER — Encounter: Payer: Self-pay | Admitting: Cardiology

## 2013-06-01 ENCOUNTER — Ambulatory Visit (INDEPENDENT_AMBULATORY_CARE_PROVIDER_SITE_OTHER): Admitting: Cardiology

## 2013-06-01 VITALS — BP 151/113 | HR 40 | Ht 65.0 in | Wt 117.0 lb

## 2013-06-01 DIAGNOSIS — I509 Heart failure, unspecified: Secondary | ICD-10-CM

## 2013-06-01 DIAGNOSIS — I480 Paroxysmal atrial fibrillation: Secondary | ICD-10-CM

## 2013-06-01 DIAGNOSIS — I4891 Unspecified atrial fibrillation: Secondary | ICD-10-CM

## 2013-06-01 DIAGNOSIS — I5022 Chronic systolic (congestive) heart failure: Secondary | ICD-10-CM

## 2013-06-01 DIAGNOSIS — N189 Chronic kidney disease, unspecified: Secondary | ICD-10-CM

## 2013-06-01 MED ORDER — FUROSEMIDE 40 MG PO TABS: ORAL_TABLET | ORAL | Status: AC

## 2013-06-01 NOTE — Patient Instructions (Signed)
Increase lasix (furosemide) to 120mg  in the AM and 80mg  in the PM. This will be 3 of a 40mg  tablet in the AM and 2 of a 40mg  tablet in the PM.  Your physician recommends that you return for lab work in about 1 week. I have given you an order for this. Please fax the results to [phone].  Your physician recommends that you schedule a follow-up appointment as needed with Dr Aundra Dubin.

## 2013-06-02 NOTE — Progress Notes (Signed)
Patient ID: Melissa Robinson, female   DOB: November 28, 1916, 80 y.o.   MRN: [id number] PCP: Dr. Brigitte Pulse  80 yo with chronic systolic CHF, atrial fibrillation, and CKD presents for cardiology followup.  She was admitted in 7/14 with dyspnea and volume overload.  She was noted to be in atrial fibrillation again (had a first episode that was transient in 4/14).  This time, the atrial fibrillation was persistent.  She was started on reduced-dose apixaban and diuresed.  She was cardioverted to NSR in 9/14.  She was admitted again in 10/14 with dizziness and orthostasis.  Hydralazine/Imdur and amiodarone were stopped and Lasix was held.  She developed a mild CHF exacerbation so Lasix was restarted at 40 mg po every other day.  She was discharged to [facility] for rehab. She is now living with her son. Over the last few months, patient has had a considerable symptomatic decline and has been noted to be in atrial fibrillation again.  Lasix has been gradually increased; she is now on 80 mg bid.  She is being followed by hospice.  She is walking very little now.  She is dressing herself still but is getting around the house in a wheelchair.  She initially felt better after I increased her Lasix at last appointment, but she is getting more short of breath again. She is eating poorly and weight is down 6 lbs.  No pedal edema.  She is disoriented at night.  She feels profoundly weak.  HR today is 40.  Creatinine has gone up to 3.2.    ECG: Regularized atrial fibrillation with RBBB escape suggestive of potential complete heart block in the setting of atrial fibrillation.  Labs (7/14): K 3.9, creatinine 2.27, HCT 33.4 Labs (8/14): K 3.5, creatinine 2.1 Labs (9/14): K 3.7, creatinine 2.09, BNP 10669 Labs (10/14): K 4.2, creatinine 2 =>1.8, BNP 9780 => 1157 Labs (1/15): K 3.3, creatinine 2.2, BNP 1482 Labs (2/15): K 4.3, creatinine 2.1 Labs (4/15): K 3.6, creatinine 3.2, HCT 34.3  PMH: 1. Gout 2. Chronic LBBB 3. H/o  OSA 4. Chronic systolic CHF: Cardiomyopathy of uncertain etiology, possibly nonischemic.  Echo (7/14) with EF 35-40%, diffuse hypokinesis, normal RV with mildly decreased RV systolic function, PA systolic pressure 46 mmHg.  5. GERD with recurrent esophageal strictures, last dilatation in 7/14.  6. Atrial fibrillation: Paroxysmal.  DCCV to NSR in 9/14, on Eliquis and amiodarone.  7. Colon cancer: s/p lap-colectomy in 5/08.   8. Anemia of chronic disease 9. CKD 10. H/o hip fracture  SH: Lives with son.  Widow, 10 children, nonsmoker.   FH: No premature CAD  ROS: All systems reviewed and negative except as per HPI.   Current Outpatient Prescriptions  Medication Sig Dispense Refill  . apixaban (ELIQUIS) 2.5 MG TABS tablet Take 1 tablet (2.5 mg total) by mouth 2 (two) times daily.  180 tablet  3  . cholecalciferol (VITAMIN D) 400 UNITS TABS Take 400 Units by mouth daily.      . cyanocobalamin 500 MCG tablet Take 500 mcg by mouth daily.      Marland Kitchen denosumab (PROLIA) 60 MG/ML SOLN injection Inject 60 mg into the skin every 6 (six) months. Administer in upper arm, thigh, or abdomen      . feeding supplement, ENSURE COMPLETE, (ENSURE COMPLETE) LIQD Take 237 mLs by mouth daily.      . iron polysaccharides (NIFEREX) 150 MG capsule Take 150 mg by mouth daily.      . mirabegron ER (  MYRBETRIQ) 25 MG TB24 Take 25 mg by mouth daily.      Marland Kitchen omeprazole (PRILOSEC) 40 MG capsule Tablet twice daily- 30 minutes before breakfast and supper  60 capsule  11  . potassium chloride (K-DUR) 10 MEQ tablet Take 1 tablet (10 mEq total) by mouth daily.  30 tablet  4  . furosemide (LASIX) 40 MG tablet 3 tablets (120mg )  in the AM and 2 tablets (80mg )in the PM  150 tablet  6   No current facility-administered medications for this visit.    BP 151/113  Pulse 40  Ht 5\' 5"  (1.651 m)  Wt 53.071 kg (117 lb)  BMI 19.47 kg/m2 General: NAD Neck: JVP 10-12 cm, no thyromegaly or thyroid nodule.  Lungs: Slight crackles at bases  bilaterally CV: Nondisplaced PMI.  Heart bradycardic, regular S1/S2, no S3/S4, no murmur.  No edema.  No carotid bruit.  Normal pedal pulses.  Abdomen: Soft, nontender, no hepatosplenomegaly, mild distention.  Skin: Intact without lesions or rashes.  Neurologic: Alert and oriented x 3.  Psych: Normal affect. Extremities: No clubbing or cyanosis.   Assessment/Plan: 1. Atrial fibrillation: Patient is bradycardic today with slow, regularized atrial fibrillation suggestive of possible complete heart block with RBBB escape.  I stopped amiodarone in the past due to dizziness and nausea.  She remains on reduced-dose Eliquis.  - Continue Eliquis at reduced dose for now.  - At last appointment, I stopped Toprol XL with bradycardia.   - She appears to have tachy-brady syndrome but no pacemaker given hospice/very poor prognosis.   2. Chronic systolic CHF: EF 53-66% on last echo.  No known history of CAD.  Possible LBBB cardiomyopathy.  NYHA class IV.  She remains volume overloaded on exam and has been getting more short of breath over the last couple of days.  - No ACEI with CKD, off hydralazine/Imdur with orthostasis/dizziness, off Toprol XL with bradycardia.  - Increase Lasix to 120 mg qam, 80 qpm.  Diuresis will be limited by cardiorenal syndrome. Can have home nurse draw BMET in 1 week.  3. CKD: Creatinine has been rising with diuresis, up to 3.2 most recently.  4. Continue hospice care.  Very poor prognosis. 5. Delirium: Periodic.  I suspect that uremia may play a role in her episodes of confusion.   Larey Dresser 06/02/2013

## 2013-06-26 DEATH — deceased

## 2013-07-04 ENCOUNTER — Telehealth: Payer: Self-pay

## 2013-07-04 NOTE — Telephone Encounter (Signed)
Patient past away @ Home per [facility]  °

## 2014-02-08 ENCOUNTER — Encounter (HOSPITAL_COMMUNITY): Payer: Self-pay | Admitting: Cardiology

## 2014-10-12 IMAGING — RF DG ESOPHAGUS
14 of 24 series · 14 of 24 positions shown · non-contrast
Comparison: No priors.

CLINICAL DATA: Dysphagia.  History of stricture status post
dilatation.

ESOPHAGUS/BARIUM SWALLOW/TABLET STUDY
Fluoroscopy Time: 3 minutes and 47 seconds.

[Series 1: run · 1 of 1 slices shown (1 of 14)]
[im 1/1]
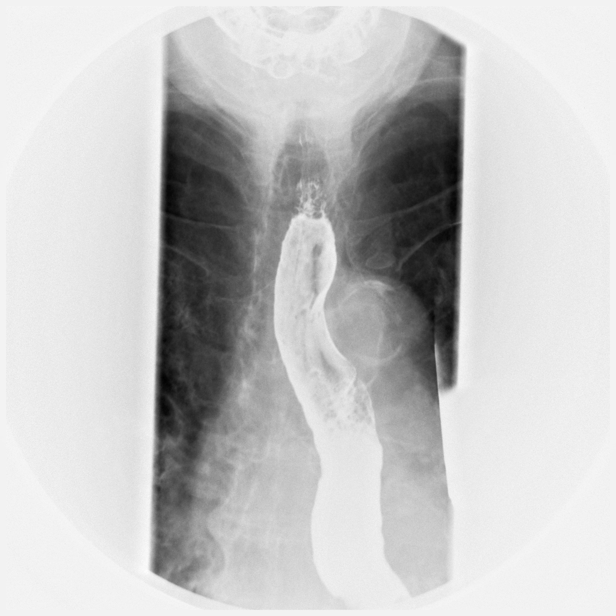

[Series 3: run · 1 of 1 slices shown (2 of 14)]
[im 1/1]
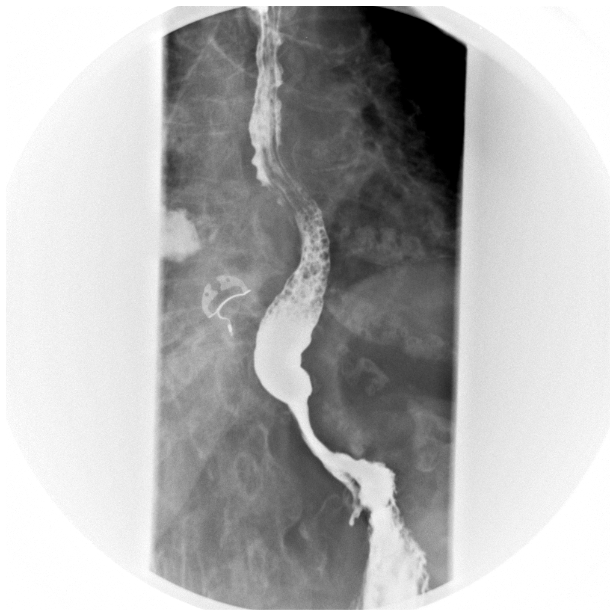

[Series 5: run · 1 of 1 slices shown (3 of 14)]
[im 1/1]
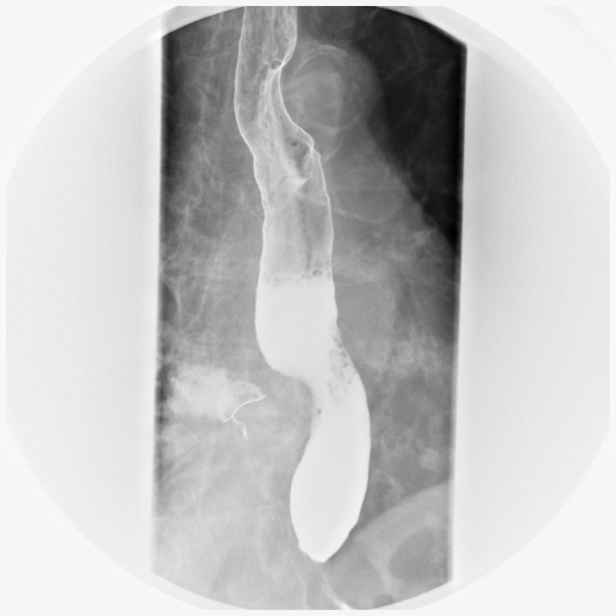

[Series 7: run · 1 of 1 slices shown (4 of 14)]
[im 1/1]
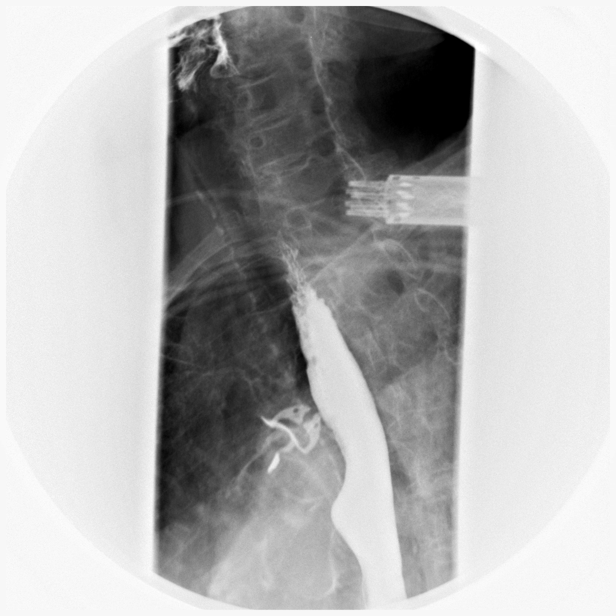

[Series 8: run · 1 of 1 slices shown (5 of 14)]
[im 1/1]
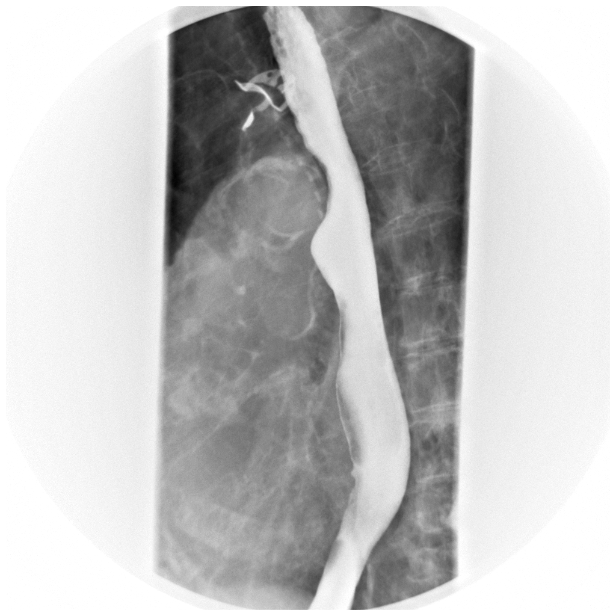

[Series 10: run · 1 of 1 slices shown (6 of 14)]
[im 1/1]
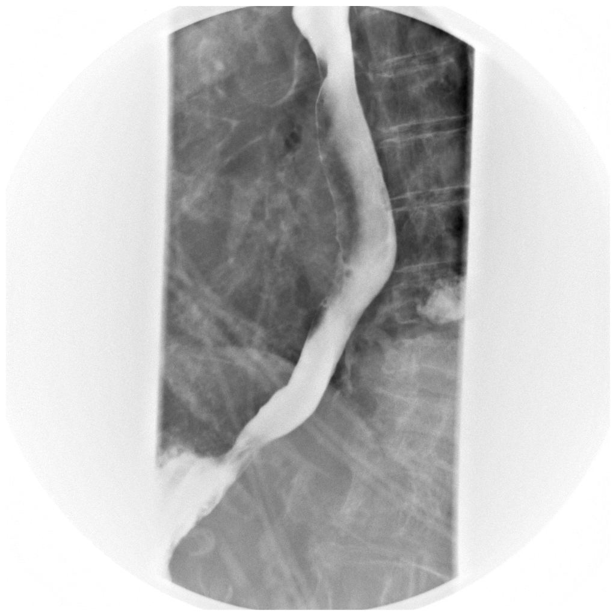

[Series 12: run · 1 of 1 slices shown (7 of 14)]
[im 1/1]
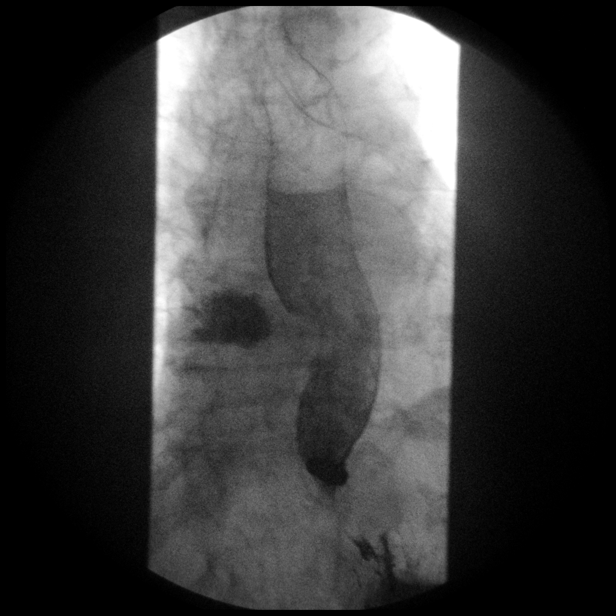

[Series 13: run · 1 of 1 slices shown (8 of 14)]
[im 1/1]
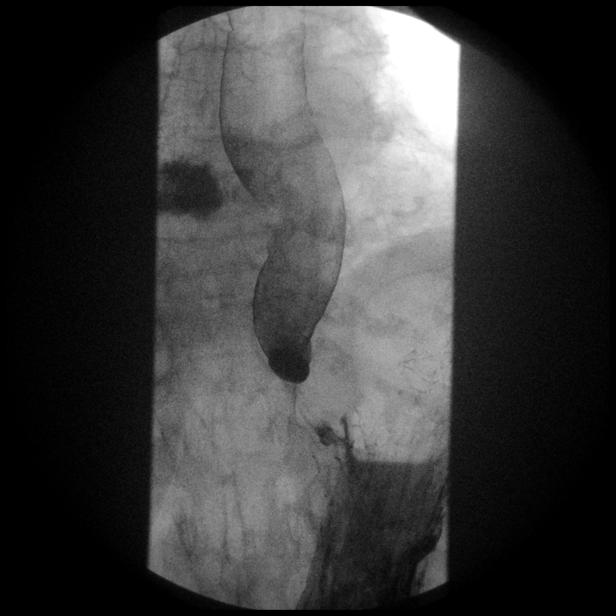

[Series 15: run · 1 of 1 slices shown (9 of 14)]
[im 1/1]
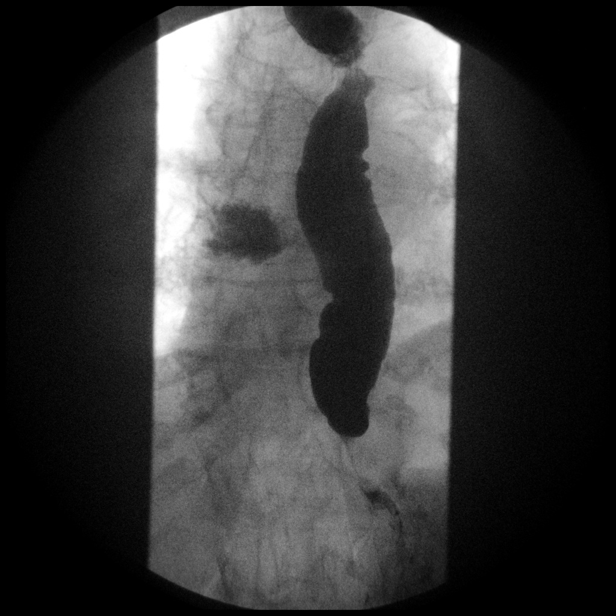

[Series 17: run · 1 of 1 slices shown (10 of 14)]
[im 1/1]
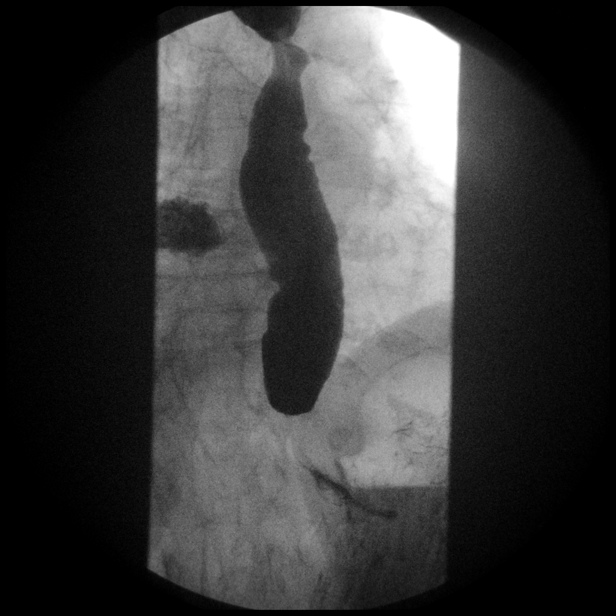

[Series 19: run · 1 of 1 slices shown (11 of 14)]
[im 1/1]
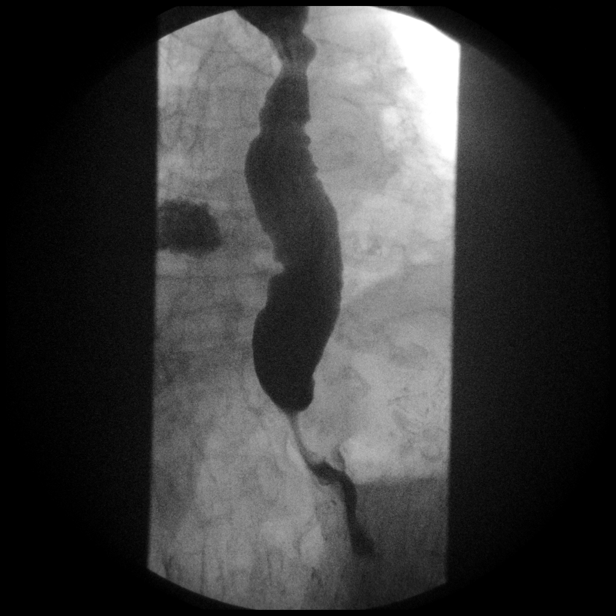

[Series 20: run · 1 of 1 slices shown (12 of 14)]
[im 1/1]
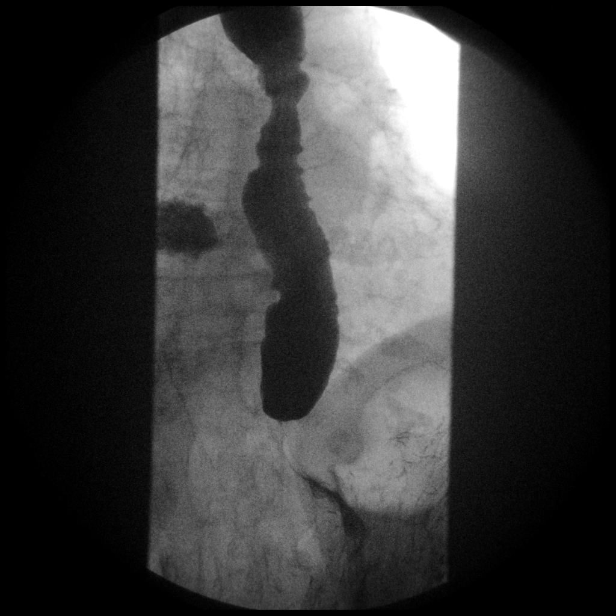

[Series 22: run · 1 of 1 slices shown (13 of 14)]
[im 1/1]
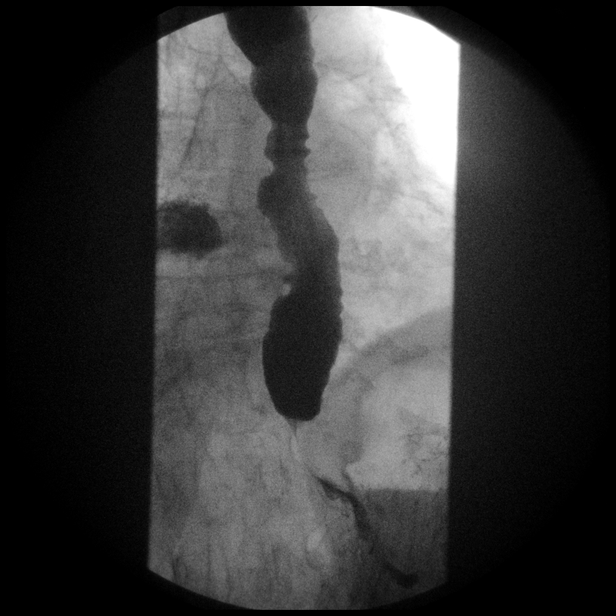

[Series 24: run · 1 of 1 slices shown (14 of 14)]
[im 1/1]
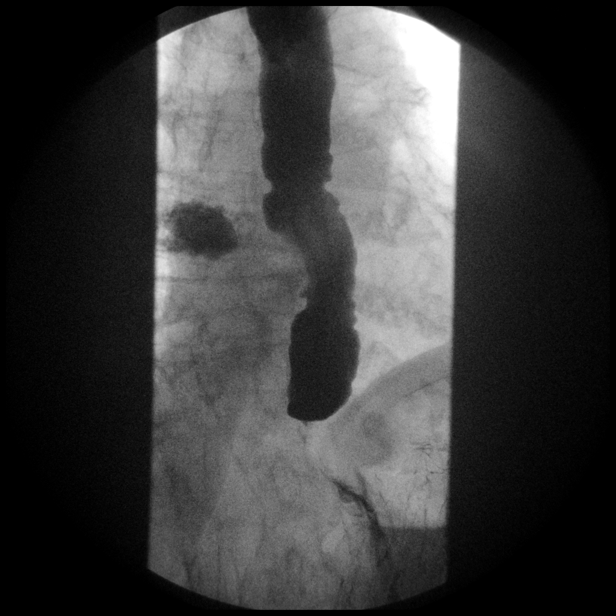

[14 of 24 positions shown; findings below may reference images not displayed]

FINDINGS: Initial double contrast images of the esophagus
demonstrates some thickening of the esophageal folds, suggestive of
esophagitis.  Multiple single swallow attempts demonstrated failure
of any of the primary peristaltic waves to propagate fully.
Additionally, multiple tertiary contractions were noted.  Full
column barium esophagram demonstrated no esophageal mass or ring.
Persistent narrowing of the distal esophagus at the level of the
gastroesophageal junction was noted.  A barium tablet was
administered which could not pass through this area of narrowing.
IMPRESSION: 1.  Distal esophageal stricture shortly above the gastroesophageal
junction.
2.  Nonspecific esophageal motility disorder with strong tertiary
contractions.
3.  Mild thickening of the esophageal mucosal folds suggestive of
esophagitis.
